# Patient Record
Sex: Female | Born: 1950
Health system: Southern US, Community
[De-identification: ages and names within clinical notes are randomized; demographics above are authoritative.]

## PROBLEM LIST (undated history)

## (undated) DIAGNOSIS — K5732 Diverticulitis of large intestine without perforation or abscess without bleeding: Secondary | ICD-10-CM

## (undated) DIAGNOSIS — F32A Depression, unspecified: Secondary | ICD-10-CM

## (undated) DIAGNOSIS — R112 Nausea with vomiting, unspecified: Secondary | ICD-10-CM

## (undated) DIAGNOSIS — T7840XA Allergy, unspecified, initial encounter: Secondary | ICD-10-CM

## (undated) DIAGNOSIS — R011 Cardiac murmur, unspecified: Secondary | ICD-10-CM

## (undated) DIAGNOSIS — K5790 Diverticulosis of intestine, part unspecified, without perforation or abscess without bleeding: Secondary | ICD-10-CM

## (undated) DIAGNOSIS — K5792 Diverticulitis of intestine, part unspecified, without perforation or abscess without bleeding: Secondary | ICD-10-CM

## (undated) DIAGNOSIS — Z9889 Other specified postprocedural states: Secondary | ICD-10-CM

## (undated) DIAGNOSIS — Z87891 Personal history of nicotine dependence: Secondary | ICD-10-CM

## (undated) DIAGNOSIS — F329 Major depressive disorder, single episode, unspecified: Secondary | ICD-10-CM

## (undated) DIAGNOSIS — C50919 Malignant neoplasm of unspecified site of unspecified female breast: Secondary | ICD-10-CM

## (undated) DIAGNOSIS — E785 Hyperlipidemia, unspecified: Secondary | ICD-10-CM

## (undated) DIAGNOSIS — K589 Irritable bowel syndrome without diarrhea: Secondary | ICD-10-CM

## (undated) HISTORY — DX: Malignant neoplasm of unspecified site of unspecified female breast: C50.919

## (undated) HISTORY — DX: Hyperlipidemia, unspecified: E78.5

## (undated) HISTORY — DX: Diverticulitis of intestine, part unspecified, without perforation or abscess without bleeding: K57.92

## (undated) HISTORY — DX: Major depressive disorder, single episode, unspecified: F32.9

## (undated) HISTORY — DX: Allergy, unspecified, initial encounter: T78.40XA

## (undated) HISTORY — DX: Depression, unspecified: F32.A

## (undated) HISTORY — DX: Cardiac murmur, unspecified: R01.1

## (undated) HISTORY — DX: Diverticulosis of intestine, part unspecified, without perforation or abscess without bleeding: K57.90

---
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
## 1982-08-02 HISTORY — PX: TUBAL LIGATION: SHX77

## 1999-03-17 ENCOUNTER — Other Ambulatory Visit: Admission: RE | Admit: 1999-03-17 | Discharge: 1999-03-17 | Payer: Self-pay | Admitting: Obstetrics & Gynecology

## 1999-04-22 ENCOUNTER — Other Ambulatory Visit: Admission: RE | Admit: 1999-04-22 | Discharge: 1999-04-22 | Payer: Self-pay | Admitting: Obstetrics & Gynecology

## 1999-06-19 ENCOUNTER — Encounter (INDEPENDENT_AMBULATORY_CARE_PROVIDER_SITE_OTHER): Payer: Self-pay

## 1999-06-19 ENCOUNTER — Ambulatory Visit (HOSPITAL_COMMUNITY): Admission: RE | Admit: 1999-06-19 | Discharge: 1999-06-19 | Payer: Self-pay | Admitting: Surgery

## 1999-06-19 ENCOUNTER — Encounter: Payer: Self-pay | Admitting: Surgery

## 1999-09-01 ENCOUNTER — Ambulatory Visit (HOSPITAL_COMMUNITY): Admission: RE | Admit: 1999-09-01 | Discharge: 1999-09-01 | Payer: Self-pay | Admitting: Surgery

## 1999-09-01 ENCOUNTER — Encounter: Payer: Self-pay | Admitting: Surgery

## 2000-03-21 ENCOUNTER — Other Ambulatory Visit: Admission: RE | Admit: 2000-03-21 | Discharge: 2000-03-21 | Payer: Self-pay | Admitting: Obstetrics & Gynecology

## 2000-09-06 ENCOUNTER — Other Ambulatory Visit: Admission: RE | Admit: 2000-09-06 | Discharge: 2000-09-06 | Payer: Self-pay | Admitting: Obstetrics & Gynecology

## 2001-05-24 ENCOUNTER — Other Ambulatory Visit: Admission: RE | Admit: 2001-05-24 | Discharge: 2001-05-24 | Payer: Self-pay | Admitting: Obstetrics & Gynecology

## 2001-08-03 LAB — HM DEXA SCAN

## 2002-03-02 LAB — HM MAMMOGRAPHY: HM Mammogram: NORMAL

## 2002-04-20 ENCOUNTER — Other Ambulatory Visit: Admission: RE | Admit: 2002-04-20 | Discharge: 2002-04-20 | Payer: Self-pay | Admitting: Obstetrics & Gynecology

## 2002-05-16 ENCOUNTER — Other Ambulatory Visit: Admission: RE | Admit: 2002-05-16 | Discharge: 2002-05-16 | Payer: Self-pay | Admitting: Obstetrics and Gynecology

## 2002-06-11 ENCOUNTER — Other Ambulatory Visit: Admission: RE | Admit: 2002-06-11 | Discharge: 2002-06-11 | Payer: Self-pay | Admitting: Surgery

## 2002-06-11 ENCOUNTER — Encounter (INDEPENDENT_AMBULATORY_CARE_PROVIDER_SITE_OTHER): Payer: Self-pay | Admitting: Specialist

## 2002-06-11 ENCOUNTER — Encounter (INDEPENDENT_AMBULATORY_CARE_PROVIDER_SITE_OTHER): Payer: Self-pay | Admitting: *Deleted

## 2002-06-11 ENCOUNTER — Encounter: Admission: RE | Admit: 2002-06-11 | Discharge: 2002-06-11 | Payer: Self-pay | Admitting: Surgery

## 2002-06-11 ENCOUNTER — Other Ambulatory Visit: Admission: RE | Admit: 2002-06-11 | Discharge: 2002-06-11 | Payer: Self-pay | Admitting: Radiology

## 2002-06-11 ENCOUNTER — Encounter: Payer: Self-pay | Admitting: Surgery

## 2002-06-20 ENCOUNTER — Encounter: Payer: Self-pay | Admitting: Surgery

## 2002-06-20 ENCOUNTER — Ambulatory Visit (HOSPITAL_COMMUNITY): Admission: RE | Admit: 2002-06-20 | Discharge: 2002-06-20 | Payer: Self-pay | Admitting: Surgery

## 2002-06-21 ENCOUNTER — Encounter: Payer: Self-pay | Admitting: Surgery

## 2002-07-03 ENCOUNTER — Encounter: Payer: Self-pay | Admitting: Surgery

## 2002-07-03 ENCOUNTER — Encounter: Admission: RE | Admit: 2002-07-03 | Discharge: 2002-07-03 | Payer: Self-pay | Admitting: Surgery

## 2002-07-17 ENCOUNTER — Other Ambulatory Visit: Admission: RE | Admit: 2002-07-17 | Discharge: 2002-07-17 | Payer: Self-pay | Admitting: Gynecology

## 2003-08-03 HISTORY — PX: BILATERAL TOTAL MASTECTOMY WITH AXILLARY LYMPH NODE DISSECTION: SHX6364

## 2003-08-03 HISTORY — PX: BREAST RECONSTRUCTION: SHX9

## 2005-05-21 ENCOUNTER — Ambulatory Visit (HOSPITAL_COMMUNITY): Admission: RE | Admit: 2005-05-21 | Discharge: 2005-05-21 | Payer: Self-pay | Admitting: *Deleted

## 2005-07-27 ENCOUNTER — Other Ambulatory Visit: Admission: RE | Admit: 2005-07-27 | Discharge: 2005-07-27 | Payer: Self-pay | Admitting: Obstetrics & Gynecology

## 2006-02-11 ENCOUNTER — Ambulatory Visit: Payer: Self-pay | Admitting: Critical Care Medicine

## 2006-03-11 ENCOUNTER — Ambulatory Visit: Payer: Self-pay | Admitting: Critical Care Medicine

## 2006-07-31 ENCOUNTER — Emergency Department (HOSPITAL_COMMUNITY): Admission: EM | Admit: 2006-07-31 | Discharge: 2006-07-31 | Payer: Self-pay | Admitting: Emergency Medicine

## 2007-08-04 LAB — HM PAP SMEAR: HM Pap smear: NORMAL

## 2009-02-21 ENCOUNTER — Encounter: Payer: Self-pay | Admitting: *Deleted

## 2009-02-21 LAB — CONVERTED CEMR LAB: Pap Smear: NORMAL

## 2009-03-11 ENCOUNTER — Encounter: Payer: Self-pay | Admitting: *Deleted

## 2009-03-11 DIAGNOSIS — F329 Major depressive disorder, single episode, unspecified: Secondary | ICD-10-CM | POA: Insufficient documentation

## 2009-03-11 DIAGNOSIS — Z8619 Personal history of other infectious and parasitic diseases: Secondary | ICD-10-CM | POA: Insufficient documentation

## 2009-03-11 DIAGNOSIS — F3289 Other specified depressive episodes: Secondary | ICD-10-CM | POA: Insufficient documentation

## 2009-03-11 DIAGNOSIS — Z853 Personal history of malignant neoplasm of breast: Secondary | ICD-10-CM | POA: Insufficient documentation

## 2009-03-11 DIAGNOSIS — Z8679 Personal history of other diseases of the circulatory system: Secondary | ICD-10-CM | POA: Insufficient documentation

## 2009-03-11 DIAGNOSIS — J309 Allergic rhinitis, unspecified: Secondary | ICD-10-CM | POA: Insufficient documentation

## 2009-03-12 ENCOUNTER — Ambulatory Visit: Payer: Self-pay | Admitting: Internal Medicine

## 2009-03-12 DIAGNOSIS — K219 Gastro-esophageal reflux disease without esophagitis: Secondary | ICD-10-CM | POA: Insufficient documentation

## 2009-05-20 ENCOUNTER — Ambulatory Visit: Payer: Self-pay | Admitting: Internal Medicine

## 2009-05-20 LAB — CONVERTED CEMR LAB
ALT: 51 units/L — ABNORMAL HIGH (ref 0–35)
BUN: 19 mg/dL (ref 6–23)
Basophils Relative: 0.7 % (ref 0.0–3.0)
Bilirubin Urine: NEGATIVE
Bilirubin, Direct: 0.1 mg/dL (ref 0.0–0.3)
CO2: 31 meq/L (ref 19–32)
Chloride: 104 meq/L (ref 96–112)
Cholesterol: 272 mg/dL — ABNORMAL HIGH (ref 0–200)
Creatinine, Ser: 0.7 mg/dL (ref 0.4–1.2)
Direct LDL: 172.5 mg/dL
Eosinophils Absolute: 0.1 10*3/uL (ref 0.0–0.7)
Eosinophils Relative: 1.5 % (ref 0.0–5.0)
Glucose, Urine, Semiquant: NEGATIVE
HCT: 44.9 % (ref 36.0–46.0)
Lymphs Abs: 1.6 10*3/uL (ref 0.7–4.0)
MCHC: 34 g/dL (ref 30.0–36.0)
MCV: 96.4 fL (ref 78.0–100.0)
Monocytes Absolute: 0.4 10*3/uL (ref 0.1–1.0)
Neutrophils Relative %: 63.3 % (ref 43.0–77.0)
Platelets: 229 10*3/uL (ref 150.0–400.0)
Potassium: 4 meq/L (ref 3.5–5.1)
Specific Gravity, Urine: >=1.03
TSH: 2.81 microintl units/mL (ref 0.35–5.50)
Total Bilirubin: 1.7 mg/dL — ABNORMAL HIGH (ref 0.3–1.2)
Total Protein: 8 g/dL (ref 6.0–8.3)
pH: 5

## 2009-05-28 ENCOUNTER — Encounter: Payer: Self-pay | Admitting: Internal Medicine

## 2009-05-28 DIAGNOSIS — E785 Hyperlipidemia, unspecified: Secondary | ICD-10-CM | POA: Insufficient documentation

## 2009-05-28 DIAGNOSIS — I079 Rheumatic tricuspid valve disease, unspecified: Secondary | ICD-10-CM | POA: Insufficient documentation

## 2009-05-29 ENCOUNTER — Ambulatory Visit: Payer: Self-pay | Admitting: Internal Medicine

## 2009-05-29 LAB — CONVERTED CEMR LAB: OCCULT 1: NEGATIVE

## 2009-07-10 ENCOUNTER — Telehealth: Payer: Self-pay | Admitting: Internal Medicine

## 2009-08-25 ENCOUNTER — Telehealth: Payer: Self-pay | Admitting: Internal Medicine

## 2009-09-12 ENCOUNTER — Ambulatory Visit: Payer: Self-pay | Admitting: Internal Medicine

## 2009-09-12 LAB — CONVERTED CEMR LAB
Albumin: 4 g/dL (ref 3.5–5.2)
Cholesterol: 246 mg/dL — ABNORMAL HIGH (ref 0–200)
Direct LDL: 147.7 mg/dL
Total Bilirubin: 1.3 mg/dL — ABNORMAL HIGH (ref 0.3–1.2)
Total CHOL/HDL Ratio: 4
Triglycerides: 180 mg/dL — ABNORMAL HIGH (ref 0.0–149.0)
VLDL: 36 mg/dL (ref 0.0–40.0)

## 2009-09-19 ENCOUNTER — Ambulatory Visit: Payer: Self-pay | Admitting: Internal Medicine

## 2009-09-19 DIAGNOSIS — R7401 Elevation of levels of liver transaminase levels: Secondary | ICD-10-CM | POA: Insufficient documentation

## 2009-09-19 DIAGNOSIS — R740 Nonspecific elevation of levels of transaminase and lactic acid dehydrogenase [LDH]: Secondary | ICD-10-CM

## 2009-09-19 LAB — CONVERTED CEMR LAB
Albumin: 3.7 g/dL (ref 3.5–5.2)
Iron: 60 ug/dL (ref 42–145)
Saturation Ratios: 20.1 % (ref 20.0–50.0)
Total Bilirubin: 0.7 mg/dL (ref 0.3–1.2)
Transferrin: 212.8 mg/dL (ref 212.0–360.0)

## 2009-10-06 LAB — CONVERTED CEMR LAB
A-1 Antitrypsin, Ser: 144 mg/dL (ref 83–200)
Ceruloplasmin: 31 mg/dL (ref 21–63)
HCV Ab: NEGATIVE
Hep B Core Total Ab: NEGATIVE
Hepatitis B Surface Ag: NEGATIVE

## 2009-11-07 ENCOUNTER — Ambulatory Visit: Payer: Self-pay | Admitting: Internal Medicine

## 2009-11-07 LAB — CONVERTED CEMR LAB
AST: 30 units/L (ref 0–37)
Bilirubin, Direct: 0 mg/dL (ref 0.0–0.3)
HDL: 68.9 mg/dL (ref >39.00)
Total Bilirubin: 0.8 mg/dL (ref 0.3–1.2)
Total CHOL/HDL Ratio: 2

## 2009-11-14 ENCOUNTER — Ambulatory Visit: Payer: Self-pay | Admitting: Internal Medicine

## 2010-01-12 ENCOUNTER — Encounter: Payer: Self-pay | Admitting: Internal Medicine

## 2010-01-16 ENCOUNTER — Encounter: Payer: Self-pay | Admitting: Endocrinology

## 2010-06-23 ENCOUNTER — Ambulatory Visit: Payer: Self-pay | Admitting: Internal Medicine

## 2010-06-23 LAB — CONVERTED CEMR LAB
ALT: 38 units/L — ABNORMAL HIGH (ref 0–35)
AST: 31 units/L (ref 0–37)
Cholesterol: 197 mg/dL (ref 0–200)
Direct LDL: 89.5 mg/dL
Total Bilirubin: 0.5 mg/dL (ref 0.3–1.2)
Total CHOL/HDL Ratio: 3
Total Protein: 7 g/dL (ref 6.0–8.3)

## 2010-08-30 LAB — CONVERTED CEMR LAB: Cholesterol, target level: 200 mg/dL

## 2010-09-03 NOTE — Letter (Signed)
Summary: [facility]  [facility]   Imported By: Maryln Gottron 01/20/2010 14:04:32  _____________________________________________________________________  External Attachment:    Type:   Image     Comment:   External Document

## 2010-09-03 NOTE — Assessment & Plan Note (Signed)
Summary: 6 MONTH F/U//ALP/PT [facility] FROM BMP/CJR/pt rescd//ccm   Vital Signs:  Patient profile:   60 year old female Height:      65 inches Weight:      156 pounds BMI:     26.05 Temp:     98.2 degrees F oral Pulse rate:   72 / minute Resp:     14 per minute BP sitting:   124 / 78  (left arm)  Vitals Entered By: Willy Eddy, LPN (June 23, 2010 2:03 PM) CC: roa Is Patient Diabetic? No   Primary Care Provider:  Stacie Glaze MD  CC:  roa.  History of Present Illness: follow up lipid management  Preventive Screening-Counseling & Management  Alcohol-Tobacco     Smoking Status: never  Problems Prior to Update: 1)  Transaminases, Serum, Elevated  (ICD-790.4) 2)  Hyperlipidemia, Mild  (ICD-272.4) 3)  Health Maintenance Exam  (ICD-V70.0) 4)  Tricuspid Regurgitation  (ICD-397.0) 5)  Esophageal Reflux  (ICD-530.81) 6)  Genital Herpes, Hx of  (ICD-V13.8) 7)  Family History of Alcoholism/addiction  (ICD-V61.41) 8)  Heart Murmur, Hx of  (ICD-V12.50) 9)  Depression  (ICD-311) 10)  Breast Cancer, Hx of  (ICD-V10.3) 11)  Allergic Rhinitis  (ICD-477.9)  Current Problems (verified): 1)  Transaminases, Serum, Elevated  (ICD-790.4) 2)  Hyperlipidemia, Mild  (ICD-272.4) 3)  Health Maintenance Exam  (ICD-V70.0) 4)  Tricuspid Regurgitation  (ICD-397.0) 5)  Esophageal Reflux  (ICD-530.81) 6)  Genital Herpes, Hx of  (ICD-V13.8) 7)  Family History of Alcoholism/addiction  (ICD-V61.41) 8)  Heart Murmur, Hx of  (ICD-V12.50) 9)  Depression  (ICD-311) 10)  Breast Cancer, Hx of  (ICD-V10.3) 11)  Allergic Rhinitis  (ICD-477.9)  Medications Prior to Update: 1)  Caltrate 600 1500 Mg Tabs (Calcium Carbonate) .... Two Times A Day 2)  Multivitamins  Tabs (Multiple Vitamin) .Marland Kitchen.. 1 Once Daily 3)  Caltrate 600 1500 Mg Tabs (Calcium Carbonate) .Marland Kitchen.. 1 Once Daily 4)  Nasonex 50 Mcg/act Susp (Mometasone Furoate) .... As Needed 5)  Zyrtec Allergy 10 Mg Caps (Cetirizine Hcl) .Marland Kitchen.. 1 Once Daily  As Needed 6)  Fish Oil 1000 Mg Caps (Omega-3 Fatty Acids) .... 2 Once Daily 7)  Lipofen 150 Mg Caps (Fenofibrate) .... One By Mouth Daily  Current Medications (verified): 1)  Caltrate 600 1500 Mg Tabs (Calcium Carbonate) .... Two Times A Day 2)  Multivitamins  Tabs (Multiple Vitamin) .Marland Kitchen.. 1 Once Daily 3)  Caltrate 600 1500 Mg Tabs (Calcium Carbonate) .Marland Kitchen.. 1 Once Daily 4)  Nasonex 50 Mcg/act Susp (Mometasone Furoate) .... As Needed 5)  Zyrtec Allergy 10 Mg Caps (Cetirizine Hcl) .Marland Kitchen.. 1 Once Daily As Needed 6)  Lipofen 150 Mg Caps (Fenofibrate) .... One By Mouth Daily  Allergies (verified): 1)  ! Nasonex (Mometasone Furoate) 2)  ! Zyrtec Allergy (Cetirizine Hcl) 3)  ! Tussionex Pennkinetic Er (Hydrocod Polst-Chlorphen Polst)  Past History:  Family History: Last updated: 03/11/2009 Family History of Alcoholism/Addiction Family History High cholesterol Family History Hypertension Family History Lung cancer Family History of Stroke F 1st degree relative <60  Social History: Last updated: 03/11/2009 Married Never Smoked Alcohol use-no Drug use-no  Risk Factors: Smoking Status: never (06/23/2010)  Past medical, surgical, family and social histories (including risk factors) reviewed, and no changes noted (except as noted below).  Past Medical History: Reviewed history from 03/11/2009 and no changes required. Allergic rhinitis Breast cancer, hx OF-Ductal carcinoma in Situ 2004 Depression Heart Murmur Gental Herpes  Past Surgical History: Reviewed history from  03/11/2009 and no changes required. Appendectomy  Mastectomy bi-lateral for breast cancer 2004 Tubal ligation 1984  Family History: Reviewed history from 03/11/2009 and no changes required. Family History of Alcoholism/Addiction Family History High cholesterol Family History Hypertension Family History Lung cancer Family History of Stroke F 1st degree relative <60  Social History: Reviewed history from  03/11/2009 and no changes required. Married Never Smoked Alcohol use-no Drug use-no  Review of Systems  The patient denies anorexia, fever, weight loss, weight gain, vision loss, decreased hearing, hoarseness, chest pain, syncope, dyspnea on exertion, peripheral edema, prolonged cough, headaches, hemoptysis, abdominal pain, melena, hematochezia, severe indigestion/heartburn, hematuria, incontinence, genital sores, muscle weakness, suspicious skin lesions, transient blindness, difficulty walking, depression, unusual weight change, abnormal bleeding, enlarged lymph nodes, angioedema, and breast masses.    Physical Exam  General:  alert and well-nourished.   Head:  normocephalic and atraumatic.   Eyes:  pupils equal and pupils round.   Ears:  R ear normal and L ear normal.   Nose:  no external deformity and no nasal discharge.   Mouth:  good dentition and pharynx pink and moist.   Neck:  supple.   Lungs:  normal respiratory effort and no intercostal retractions.   Heart:  normal rate, regular rhythm, and Grade  1/2 /6 systolic ejection murmur.   Abdomen:  soft, non-tender, and normal bowel sounds.   Msk:  No deformity or scoliosis noted of thoracic or lumbar spine.   Extremities:  No clubbing, cyanosis, edema, or deformity noted with normal full range of motion of all joints.   Neurologic:  alert & oriented X3 and gait normal.     Impression & Recommendations:  Problem # 1:  TRANSAMINASES, SERUM, ELEVATED (ICD-790.4) she has not lost weight ands this is the primary therapy fro faty liver she has lost weight ans the weight gain is a regression for goals Orders: Fingerstick (29562) Venipuncture (13086) Specimen Handling (57846) TLB-Hepatic/Liver Function Pnl (80076-HEPATIC)  Problem # 2:  HYPERLIPIDEMIA, MILD (ICD-272.4) on the meds Her updated medication list for this problem includes:    Lipofen 150 Mg Caps (Fenofibrate) ..... One by mouth daily  Orders: Venipuncture  (96295) Specimen Handling (28413) TLB-Lipid Panel (80061-LIPID)  Labs Reviewed: SGOT: 30 (11/07/2009)   SGPT: 33 (11/07/2009)  Lipid Goals: Chol Goal: 200 (05/28/2009)   HDL Goal: 40 (05/28/2009)   LDL Goal: 160 (05/28/2009)   TG Goal: 150 (05/28/2009)  Prior 10 Yr Risk Heart Disease: 2 % (11/14/2009)   HDL:68.90 (11/07/2009), 59.90 (09/12/2009)  LDL:86 (11/07/2009)  Chol:170 (11/07/2009), 246 (09/12/2009)  Trig:77.0 (11/07/2009), 180.0 (09/12/2009)  Problem # 3:  DEPRESSION (ICD-311) mood is ok except for the stress  Discussed treatment options, including trial of antidpressant medication. Will refer to behavioral health. Follow-up call in in 24-48 hours and recheck in 2 weeks, sooner as needed. Patient agrees to call if any worsening of symptoms or thoughts of doing harm arise. Verified that the patient has no suicidal ideation at this time.   Problem # 4:  ALLERGIC RHINITIS (ICD-477.9)  Her updated medication list for this problem includes:    Nasonex 50 Mcg/act Susp (Mometasone furoate) .Marland Kitchen... As needed    Zyrtec Allergy 10 Mg Caps (Cetirizine hcl) .Marland Kitchen... 1 once daily as needed  Discussed use of allergy medications and environmental measures.   Complete Medication List: 1)  Caltrate 600 1500 Mg Tabs (Calcium carbonate) .... Two times a day 2)  Multivitamins Tabs (Multiple vitamin) .Marland Kitchen.. 1 once daily 3)  Caltrate 600 1500  Mg Tabs (Calcium carbonate) .Marland Kitchen.. 1 once daily 4)  Nasonex 50 Mcg/act Susp (Mometasone furoate) .... As needed 5)  Zyrtec Allergy 10 Mg Caps (Cetirizine hcl) .Marland Kitchen.. 1 once daily as needed 6)  Lipofen 150 Mg Caps (Fenofibrate) .... One by mouth daily  Patient Instructions: 1)  Please schedule a follow-up appointment in 6 months.  CPX   Orders Added: 1)  Fingerstick [36416] 2)  Est. Patient Level III [95284] 3)  Venipuncture [13244] 4)  Specimen Handling [99000] 5)  TLB-Hepatic/Liver Function Pnl [80076-HEPATIC] 6)  TLB-Lipid Panel [80061-LIPID]

## 2010-09-03 NOTE — Progress Notes (Signed)
Summary: Tussionex  Phone Note Call from Patient   Caller: Patient Call For: Stacie Glaze MD Reason for Call: Talk to Nurse Complaint: Cough/Sore throat Summary of Call: Requesting refill on Tussionex for cough. Takes it about 4 x weekly  q hs and sometimes daily. [phone] [facility]  [phone]   Initial call taken by: Lynann Beaver CMA,  August 25, 2009 4:09 PM  Follow-up for Phone Call        per dr Troy Sine refill tussionex 1 time Follow-up by: Willy Eddy, LPN,  August 25, 2009 4:34 PM    Prescriptions: Sandria Senter ER 8-10 MG/5ML LQCR (CHLORPHENIRAMINE-HYDROCODONE) one teaspoon q 12 hours as needed cough  #4oz x 0   Entered by:   Lynann Beaver CMA   Authorized by:   Stacie Glaze MD   Signed by:   Lynann Beaver CMA on 08/25/2009   Method used:   Telephoned to ...         RxID:   [id number]

## 2010-09-03 NOTE — Letter (Signed)
Summary: [facility]   Imported By: Sherian Rein 01/22/2010 13:22:35  _____________________________________________________________________  External Attachment:    Type:   Image     Comment:   External Document

## 2010-09-03 NOTE — Assessment & Plan Note (Signed)
Summary: 2 MONTH ROV/NJR   Vital Signs:  Patient profile:   60 year old female Height:      65 inches Weight:      150 pounds BMI:     25.05 Temp:     98.2 degrees F oral Pulse rate:   72 / minute Resp:     14 per minute BP sitting:   116 / 70  (left arm)  Vitals Entered By: Willy Eddy, LPN (November 14, 2009 11:41 AM) CC: roa labs, Lipid Management   CC:  roa labs and Lipid Management.  Lipid Management History:      Positive NCEP/ATP III risk factors include female age 19 years old or older.  Negative NCEP/ATP III risk factors include HDL cholesterol greater than 60, non-tobacco-user status, no ASHD (atherosclerotic heart disease), no prior stroke/TIA, no peripheral vascular disease, and no history of aortic aneurysm.     Allergies: 1)  ! Nasonex (Mometasone Furoate) 2)  ! Zyrtec Allergy (Cetirizine Hcl) 3)  ! Tussionex Pennkinetic Er (Chlorpheniramine-Hydrocodone)   Impression & Recommendations:  Problem # 1:  HYPERLIPIDEMIA, MILD (ICD-272.4)  Her updated medication list for this problem includes:    Trilipix 135 Mg Cpdr (Choline fenofibrate) ..... One by mouth daily  Labs Reviewed: SGOT: 30 (11/07/2009)   SGPT: 33 (11/07/2009)  Lipid Goals: Chol Goal: 200 (05/28/2009)   HDL Goal: 40 (05/28/2009)   LDL Goal: 160 (05/28/2009)   TG Goal: 150 (05/28/2009)  10 Yr Risk Heart Disease: 2 % Prior 10 Yr Risk Heart Disease: Not enough information (05/28/2009)   HDL:68.90 (11/07/2009), 59.90 (09/12/2009)  LDL:86 (11/07/2009)  Chol:170 (11/07/2009), 246 (09/12/2009)  Trig:77.0 (11/07/2009), 180.0 (09/12/2009)  Problem # 2:  ESOPHAGEAL REFLUX (ICD-530.81)  Labs Reviewed: Hgb: 15.2 (05/20/2009)   Hct: 44.9 (05/20/2009)  Problem # 3:  TRANSAMINASES, SERUM, ELEVATED (ICD-790.4) reversing with weight and lipid contrl  Complete Medication List: 1)  Caltrate 600 1500 Mg Tabs (Calcium carbonate) .... Two times a day 2)  Multivitamins Tabs (Multiple vitamin) .Marland Kitchen.. 1 once  daily 3)  Caltrate 600 1500 Mg Tabs (Calcium carbonate) .Marland Kitchen.. 1 once daily 4)  Nasonex 50 Mcg/act Susp (Mometasone furoate) .... As needed 5)  Zyrtec Allergy 10 Mg Caps (Cetirizine hcl) .Marland Kitchen.. 1 once daily as needed 6)  Fish Oil 1000 Mg Caps (Omega-3 fatty acids) .... 2 once daily 7)  Trilipix 135 Mg Cpdr (Choline fenofibrate) .... One by mouth daily  Lipid Assessment/Plan:      Based on NCEP/ATP III, the patient's risk factor category is "0-1 risk factors".  The patient's lipid goals are as follows: Total cholesterol goal is 200; LDL cholesterol goal is 160; HDL cholesterol goal is 40; Triglyceride goal is 150.  Her LDL cholesterol goal has been met.  Secondary causes for hyperlipidemia have been ruled out.  She has been counseled on adjunctive measures for lowering her cholesterol and has been provided with dietary instructions.    Patient Instructions: 1)  Please schedule a follow-up appointment in 6 months. 2)  Hepatic Panel prior to visit, ICD-9:995.20 3)  Lipid Panel prior to visit, ICD-9:272.4 Prescriptions: TRILIPIX 135 MG CPDR (CHOLINE FENOFIBRATE) one by mouth daily  #30 x 11   Entered and Authorized by:   Stacie Glaze MD   Signed by:   Stacie Glaze MD on 11/14/2009   Method used:   Print then Give to Patient   RxID:   [id number]

## 2010-09-03 NOTE — Assessment & Plan Note (Signed)
Summary: 4 MONTH ROV/NJR   Vital Signs:  Patient profile:   60 year old female Height:      65 inches Weight:      152 pounds BMI:     25.39 Temp:     98.2 degrees F oral Pulse rate:   72 / minute Resp:     12 per minute BP sitting:   130 / 80  (left arm)  Vitals Entered By: Willy Eddy, LPN (September 19, 2009 11:31 AM) CC: ra labs   CC:  ra labs.  History of Present Illness: follow up lipids and elevated liver enzymes that were attributed to possible fatty liver but the elevation is worisome for other etiologies will proceed with wrk uo for potential coause of liver dz will treat the eleavtons of the lipid with fibrinates   Hyperlipidemia Follow-Up      This is a 60 year old woman who presents for Hyperlipidemia follow-up.  The patient denies muscle aches, GI upset, abdominal pain, flushing, itching, constipation, diarrhea, and fatigue.  The patient denies the following symptoms: chest pain/pressure, exercise intolerance, dypsnea, palpitations, syncope, and pedal edema.  Compliance with medications (by patient report) has been near 100%.  Dietary compliance has been excellent.  The patient reports exercising daily.    Preventive Screening-Counseling & Management  Alcohol-Tobacco     Smoking Status: never  Problems Prior to Update: 1)  Hyperlipidemia, Mild  (ICD-272.4) 2)  Health Maintenance Exam  (ICD-V70.0) 3)  Tricuspid Regurgitation  (ICD-397.0) 4)  Esophageal Reflux  (ICD-530.81) 5)  Genital Herpes, Hx of  (ICD-V13.8) 6)  Family History of Alcoholism/addiction  (ICD-V61.41) 7)  Heart Murmur, Hx of  (ICD-V12.50) 8)  Depression  (ICD-311) 9)  Breast Cancer, Hx of  (ICD-V10.3) 10)  Allergic Rhinitis  (ICD-477.9)  Current Problems (verified): 1)  Hyperlipidemia, Mild  (ICD-272.4) 2)  Health Maintenance Exam  (ICD-V70.0) 3)  Tricuspid Regurgitation  (ICD-397.0) 4)  Esophageal Reflux  (ICD-530.81) 5)  Genital Herpes, Hx of  (ICD-V13.8) 6)  Family History of  Alcoholism/addiction  (ICD-V61.41) 7)  Heart Murmur, Hx of  (ICD-V12.50) 8)  Depression  (ICD-311) 9)  Breast Cancer, Hx of  (ICD-V10.3) 10)  Allergic Rhinitis  (ICD-477.9)  Medications Prior to Update: 1)  Caltrate 600 1500 Mg Tabs (Calcium Carbonate) .... Two Times A Day 2)  Multivitamins  Tabs (Multiple Vitamin) .Marland Kitchen.. 1 Once Daily 3)  Fibercon 625 Mg Tabs (Calcium Polycarbophil) .Marland Kitchen.. 1 Once Daily 4)  Nasonex 50 Mcg/act Susp (Mometasone Furoate) .... As Needed 5)  Zyrtec Allergy 10 Mg Caps (Cetirizine Hcl) .Marland Kitchen.. 1 Once Daily As Needed 6)  Krill Oil 1000 Mg Caps (Krill Oil) .... Two By Mouth Two Times A Day 7)  Tussionex Pennkinetic Er 8-10 Mg/107ml Lqcr (Chlorpheniramine-Hydrocodone) .... One Teaspoon Q 12 Hours As Needed Cough  Current Medications (verified): 1)  Caltrate 600 1500 Mg Tabs (Calcium Carbonate) .... Two Times A Day 2)  Multivitamins  Tabs (Multiple Vitamin) .Marland Kitchen.. 1 Once Daily 3)  Fibercon 625 Mg Tabs (Calcium Polycarbophil) .Marland Kitchen.. 1 Once Daily 4)  Nasonex 50 Mcg/act Susp (Mometasone Furoate) .... As Needed 5)  Zyrtec Allergy 10 Mg Caps (Cetirizine Hcl) .Marland Kitchen.. 1 Once Daily As Needed 6)  Fish Oil 1000 Mg Caps (Omega-3 Fatty Acids) .... 2 Once Daily 7)  Trilipix 135 Mg Cpdr (Choline Fenofibrate) .... One By Mouth Daily  Allergies (verified): 1)  ! Nasonex (Mometasone Furoate) 2)  ! Zyrtec Allergy (Cetirizine Hcl) 3)  ! Tussionex Pennkinetic Er (  Chlorpheniramine-Hydrocodone)  Past History:  Family History: Last updated: 03/11/2009 Family History of Alcoholism/Addiction Family History High cholesterol Family History Hypertension Family History Lung cancer Family History of Stroke F 1st degree relative <60  Social History: Last updated: 03/11/2009 Married Never Smoked Alcohol use-no Drug use-no  Risk Factors: Smoking Status: never (09/19/2009)  Past medical, surgical, family and social histories (including risk factors) reviewed, and no changes noted (except as  noted below).  Past Medical History: Reviewed history from 03/11/2009 and no changes required. Allergic rhinitis Breast cancer, hx OF-Ductal carcinoma in Situ 2004 Depression Heart Murmur Gental Herpes  Past Surgical History: Reviewed history from 03/11/2009 and no changes required. Appendectomy  Mastectomy bi-lateral for breast cancer 2004 Tubal ligation 1984  Family History: Reviewed history from 03/11/2009 and no changes required. Family History of Alcoholism/Addiction Family History High cholesterol Family History Hypertension Family History Lung cancer Family History of Stroke F 1st degree relative <60  Social History: Reviewed history from 03/11/2009 and no changes required. Married Never Smoked Alcohol use-no Drug use-no  Review of Systems  The patient denies anorexia, fever, weight loss, weight gain, vision loss, decreased hearing, hoarseness, chest pain, syncope, dyspnea on exertion, peripheral edema, prolonged cough, headaches, hemoptysis, abdominal pain, melena, hematochezia, severe indigestion/heartburn, hematuria, incontinence, genital sores, muscle weakness, suspicious skin lesions, transient blindness, difficulty walking, depression, unusual weight change, abnormal bleeding, enlarged lymph nodes, angioedema, and breast masses.    Physical Exam  General:  alert and well-nourished.   Head:  normocephalic and atraumatic.   Eyes:  pupils equal and pupils round.   Ears:  R ear normal and L ear normal.   Nose:  no external deformity and no nasal discharge.   Mouth:  good dentition and pharynx pink and moist.   Neck:  supple and full ROM.   Lungs:  normal respiratory effort and no intercostal retractions.   Heart:  normal rate, regular rhythm, and Grade  1/2 /6 systolic ejection murmur.   Abdomen:  soft, non-tender, and normal bowel sounds.     Impression & Recommendations:  Problem # 1:  TRANSAMINASES, SERUM, ELEVATED (ICD-790.4) Assessment  Deteriorated  Orders: Venipuncture (16109) TLB-IBC Pnl (Iron/FE;Transferrin) (83550-IBC) TLB-Hepatic/Liver Function Pnl (80076-HEPATIC) T-Ceruloplasmin (60454-09811) T-Hepatitis B Core Antibody (91478-29562) T-Hepatitis B Surface Antigen (13086-57846) T-Hepatitis C Antibody (96295-28413) T-Alpha-1-Antitrypsin Tot (24401-02725)  Problem # 2:  HYPERLIPIDEMIA, MILD (ICD-272.4)  Labs Reviewed: SGOT: 77 (09/12/2009)   SGPT: 99 (09/12/2009)  Lipid Goals: Chol Goal: 200 (05/28/2009)   HDL Goal: 40 (05/28/2009)   LDL Goal: 160 (05/28/2009)   TG Goal: 150 (05/28/2009)  Prior 10 Yr Risk Heart Disease: Not enough information (05/28/2009)   HDL:59.90 (09/12/2009), 50.20 (05/20/2009)  Chol:246 (09/12/2009), 272 (05/20/2009)  Trig:180.0 (09/12/2009), 209.0 (05/20/2009)  Her updated medication list for this problem includes:    Trilipix 135 Mg Cpdr (Choline fenofibrate) ..... One by mouth daily  Complete Medication List: 1)  Caltrate 600 1500 Mg Tabs (Calcium carbonate) .... Two times a day 2)  Multivitamins Tabs (Multiple vitamin) .Marland Kitchen.. 1 once daily 3)  Fibercon 625 Mg Tabs (Calcium polycarbophil) .Marland Kitchen.. 1 once daily 4)  Nasonex 50 Mcg/act Susp (Mometasone furoate) .... As needed 5)  Zyrtec Allergy 10 Mg Caps (Cetirizine hcl) .Marland Kitchen.. 1 once daily as needed 6)  Fish Oil 1000 Mg Caps (Omega-3 fatty acids) .... 2 once daily 7)  Trilipix 135 Mg Cpdr (Choline fenofibrate) .... One by mouth daily  Patient Instructions: 1)  Please schedule a follow-up appointment in 2 months. 2)  Hepatic Panel  prior to visit, ICD-9:995.20 3)  Lipid Panel prior to visit, ICD-9:272.4

## 2010-09-16 ENCOUNTER — Other Ambulatory Visit: Payer: Self-pay | Admitting: Internal Medicine

## 2010-11-23 ENCOUNTER — Other Ambulatory Visit: Payer: Self-pay | Admitting: *Deleted

## 2010-11-23 MED ORDER — FENOFIBRATE 150 MG PO CAPS: 150.0000 mg | ORAL_CAPSULE | Freq: Every day | ORAL | Status: DC

## 2010-12-18 NOTE — Assessment & Plan Note (Signed)
HEALTHCARE                               PULMONARY OFFICE NOTE   Nicole Dawson, Nicole Dawson                      MRN:          [id number]  DATE:  02/11/2006                              DOB:      11/09/1950    CHIEF COMPLAINT:  Evaluate chronic cough.   HISTORY OF PRESENT ILLNESS:  This is a 60 year old white female who has been  labeled as having COPD in year 2005.  She has had a chronic cough that has  been episodic over the last two years now progressively worse this spring.  She works in an area at [employer] near [employer] facility  and is concerned the material from that she might be exposed to.  She notes  postnasal drainage, chronic sinus inflammation.  She has a cough that is  essentially dry, but occasionally productive of thin, clear mucus.  She does  have a rattling in the throat area.  She is not particularly short of  breath.  She is not short of breath with exertion or at rest.  She denies  any previous history of pneumonia.  She smoked for 30 years a pack a day,  but quit smoking eight years ago.  She has had bronchitis in the past, but  not frequently.  She has had no hemoptysis, no chest pain.  Denies any  irregular heartbeats.  There is no acid reflux.  She does occasionally  cough, though, when she lays flat.  She has no abdominal pain.  There is no  difficulty swallowing.  Does have some tooth and dental issues.  Has nasal  congestion and sneezing at this time.  She has been placed on Spiriva  previously and since being on the Spiriva the cough is largely improved.  She is referred for evaluation.   PAST MEDICAL HISTORY:   MEDICAL:  No history of hypertension, heart disease, heart dysrhythmias,  emphysema, diabetes, cholesterol, stroke, blood clots.  No history of  chronic headaches, renal disease, or sleep disorders.   OPERATIVE HISTORY:  1.  Bilateral mastectomy in 2004 and 2005 with saline implants for  breast      cancer.  2.  History of appendectomy 1962.  3.  Tubal ligation 1984.   MEDICATION ALLERGIES:  None.   CURRENT MEDICATIONS:  Spiriva daily, calcium daily, multivitamins daily.   SOCIAL HISTORY:  Works at [employer].  Lives with her daughter.  Is  divorced.   FAMILY HISTORY:  Positive for asthma in a sister, COPD in the mother.   REVIEW OF SYSTEMS:  Otherwise noncontributory.   PHYSICAL EXAMINATION:  GENERAL:  This is a middle-aged female in no  distress.  VITAL SIGNS:  Temperature 97, blood pressure 128/82, pulse 76, saturation  97% on room air.  CHEST:  Distant breath sounds with prolonged expiratory phase.  No wheeze or  rhonchi noted.  CARDIAC:  Regular rate and rhythm without S3.  Normal S1, S2.  ABDOMEN:  Soft, nontender.  EXTREMITIES:  No edema or clubbing.  SKIN:  Clear.  NEUROLOGIC:  Intact.  HEENT:  No jugular venous  distention.  No lymphadenopathy.  Oropharynx  clear.  NECK:  Supple.   Pulmonary functions were obtained today and revealed an FEV1 of 130% of  predicted, FVC of 137% predicted and values were compatible with normal  spirometry.   IMPRESSION:  Essentially normal spirometry in a patient with cyclical cough.  I am more concerned about reflux-induced cough and postnasal drip syndrome  than primary lung disease.   PLAN:  Obtain full set of pulmonary function studies, to get a trial of  Protonix at 40 mg daily and Brovex which is brompheniramine 5 mL b.i.d.  We  may give all due consideration to discontinuance of Spiriva.  Will see the  patient back in follow-up pending full sets of pulmonary function studies.                                   Charlcie Cradle Delford Field, MD, FCCP   PEW/MedQ  DD:  02/14/2006  DT:  02/15/2006  Job #:  [id number]   cc:   Janae Bridgeman. Eloise Harman., MD

## 2010-12-18 NOTE — Op Note (Signed)
[name], Nicole Dawson             ACCOUNT NO.:  [id number]   MEDICAL RECORD NO.:  [id number]          PATIENT TYPE:  AMB   LOCATION:  ENDO                         FACILITY:  [facility]   PHYSICIAN:  Georgiana Spinner, M.D.    DATE OF BIRTH:  1951/01/10   DATE OF PROCEDURE:  05/21/2005  DATE OF DISCHARGE:                                 OPERATIVE REPORT   PROCEDURE:  Colonoscopy.   INDICATIONS:  Colon cancer screening.   ANESTHESIA:  Demerol 100, Versed 10 mg.   PROCEDURE:  With the patient mildly sedated in the left lateral decubitus  position, the Olympus videoscopic colonoscope was inserted in the rectum and  passed under direct vision through a tortuous sigmoid colon. We reached the  cecum identified by ileocecal valve and appendiceal orifice. From this  point, the colonoscope was slowly withdrawn taking circumferential views of  the colonic mucosa stopping only in the ascending, that is the right colon,  where diverticula were seen. As we withdrew all the way to the rectum taking  circumferential views of colonic mucosa stopping then next in the rectum  which appeared normal on direct and showed hemorrhoids on retroflexed view,  the endoscope was straightened and withdrawn. The patient's vital signs and  pulse oximeter remained stable. The patient tolerated the procedure well  without apparent complications.   FINDINGS:  Internal hemorrhoids and diverticulosis of the ascending colon.   PLAN:  Have the patient follow-up with me as needed.           ______________________________  Georgiana Spinner, M.D.     GMO/MEDQ  D:  05/21/2005  T:  05/21/2005  Job:  [id number]

## 2010-12-18 NOTE — Consult Note (Signed)
NAMETENEE, WISH           ACCOUNT NO.:  [id number]   MEDICAL RECORD NO.:  [id number]          PATIENT TYPE:  EMS   LOCATION:  ED                           FACILITY:  [facility]   PHYSICIAN:  Lonia Blood, M.D.DATE OF BIRTH:  09-23-1950   DATE OF CONSULTATION:  08/01/2006  DATE OF DISCHARGE:                                 CONSULTATION   PRIMARY CARE PHYSICIAN:  Dr. Higinio Plan.   REASON FOR CONSULTATION:  Chest pain.   HISTORY OF PRESENT ILLNESS:  [name] is a very pleasant 60-  year-old female who is nondiabetic, nonhypertensive, has normal lipids,  is a nonsmoker, but has a positive family history of coronary artery  disease.  She was in her usual state of good health until this morning  when in the car driving home from church she had the acute onset of a  crampy left shoulder pain.  This pain radiated down her left arm.  There  was no paralysis or paresthesia associated with it.  There was no chest  discomfort whatsoever.  There was no shortness of breath or diaphoresis.  Symptoms lasted approximately 5 to 10 minutes maximum and then resolved  spontaneously.  Later in the evening at approximately 7:00, the patient  experienced the abrupt onset of bilateral chest-type tightness.  This  was not substernal but was below the breast bilaterally.  This was not  associated with palpitations, diaphoresis, or shortness of breath.  This  occurred while sitting still and watching TV.  Symptoms resolved after  approximately 30 minutes.  They were not altered by physical exertion by  any means.  There was no diaphoresis.  The patient has not had similar  symptoms.  Symptoms have not recurred while in the emergency room.  The  patient states that she is physically active and denies any dyspnea on  exertion or exercise intolerance of late.   REVIEW OF SYSTEMS:  Comprehensive review of symptoms is unremarkable.   PAST MEDICAL HISTORY:  1. Breast cancer, status post  bilateral mastectomies with saline      implant reconstruction in 2004.  2. Status post appendectomy in 1962.  3. Status post tubal ligation in 1984.  4. Remote 30-pack-year history of tobacco abuse with discontinuation      greater than 8 years ago.  5. Internal hemorrhoids via colonoscopy.  6. Diverticulosis at the ascending colon via colonoscopy.   OUTPATIENT MEDICATIONS:  Brovex forallergies, occasional Advil,  occasional Tylenol.   ALLERGIES:  NO KNOWN DRUG ALLERGIES.   FAMILY HISTORY:  The patient's father died at age 86 secondary to MI.  The patient's mother died with complications from COPD after having had  multiple CVAs. The patient has a sister with mitral valve prolapse.   SOCIAL HISTORY:  The patient does not presently smoke.  She does not  drink alcohol.  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   DAILY REVIEW:  Basic metabolic panel is normal.  CBC is normal.  Myoglobin is 29.  CK-MB is 1.2.  Troponin I is less than 0.05.  Chest x-  ray reveals COPD with questionable right upper lobe lung nodule.  The 12-  lead EKG is normal sinus rhythm at 84 beats per minute with no ST or T  wave changes and is a normal EKG.   PHYSICAL EXAMINATION:  VITAL SIGNS:  Temperature is 97.6 F, blood  pressure 128/70, heart rate is 84, respiratory rate is 20, and O2  saturation is 98% on room air.  GENERAL:  Well-developed and well-nourished female in no acute  respiratory distress.  HEENT:  Normocephalic and atraumatic.  Pupils are equal, round, and  reactive to light and accommodation.  Extraocular muscles are intact.  OC/OP clear.  NECK:  No JVD, no lymphadenopathy, and no thyromegaly.  LUNGS:  Clear to auscultation bilaterally without wheezes or rhonchi.  CARDIOVASCULAR:  Regular rate and rhythm without murmurs, gallops, or  rubs.  Normal S1 and S2.  CHEST WALL:  No tenderness to palpation across the chest wall, right,  left, and  central.  ABDOMEN:  Nontender, nondistended, soft, and bowel sounds present.  No  hepatosplenomegaly, no rebound, and no ascites.  EXTREMITIES:  No significant cyanosis, clubbing, or edema in bilateral  lower extremities.  NEUROLOGICAL EXAM:  5/5 strength in bilateral upper and lower  extremities.  Cranial nerves II-XII intact bilaterally.  Alert and  oriented x4.  Intact sensation throughout.  MUSCULOSKELETAL:  There is pain on palpation of the left shoulder, and  the patient has full active and passive range of motion in the shoulder  with no difficulty.  There is no erythema or evidence of joint effusion.   ASSESSMENT AND RECOMMENDATIONS:  1. Noncardiac chest pain--the patient's description of her pain is not      consistent with angina.  Although she does have a family history,      she has no other risk factors whatsoever for coronary artery      disease.  Her electrocardiogram is completely normal.  Point of      care markers are unremarkable.  At this point, I do not feel that      the patient's pretest probability for coronary artery disease is      significant enough to warrant inpatient hospitalization.      Furthermore, I do not feel that her symptoms are consistent with      unstable angina pectoris.  I have recommended that a routine      screening outpatient stress test would be appropriate given her      family history.  I have suggested that she see her primary care      physician within the next 7 to 10 days to arrange this.  She tells      me that she was due for a yearly physical anyway and plans to      proceed with planning this within the next week.  2. Possible right upper lobe nodule--the formal dictation of the      patient's chest x-ray raises the question of a possible right upper      lobe pulmonary nodule.  I have discussed this at length with the      patient.  I have discussed with her the likelihood that this is a     benign incidental finding.  I have  explained, however, that she      certainly should have this followed up on given her remote history      of smoking and her history of breast cancer.  I advised  her that      computed tomography scan of the chest would be the most appropriate      means to evaluate this.  I have explained to her that I feel it      would be reasonable to schedule this via Dr. Lendell Caprice in the      outpatient setting when she presents for her full physical.  This      way we can be assured that the results would be appropriately      followed.  I am assuring that a copy of this letter is sent to Dr.      Lendell Caprice so that he can be aware of the need for this evaluation.   Thank you very much for your consultation on this pleasant lady.  At  this point, I do not feel that there is anything that we have to offer  her by admitting her to the hospital.  I am clearing her for discharge  home and have asked that she follow up with Dr. Lendell Caprice in the next 7  to 10 days for re-evaluation, considering of outpatient cardiac stress  testing, and scheduling of the CT scan of the chest to follow up a right  upper lobe nodule.      Lonia Blood, M.D.  Electronically Signed     JTM/MEDQ  D:  08/01/2006  T:  08/01/2006  Job:  [id number]   cc:   Janae Bridgeman. Eloise Harman., M.D.  Fax: [phone]

## 2010-12-18 NOTE — Assessment & Plan Note (Signed)
[facility]                               PULMONARY OFFICE NOTE   [name], [name]                    MRN:          [id number]  DATE:03/11/2006                            DOB:          11-06-1950    Nicole Dawson was seen today in return followup.  Her cough is resolved.  Level  of dyspnea is improved.  She is now off Spiriva.  Pulmonary function has  been obtained and are completely normal.  FEV-1 of over 120% predicted  diffusion capacity.  Lung capacities are all normal.  She is on the Protonix  40 mg daily.  The cough now is gone.   EXAMINATION:  VITAL SIGNS:  Temp 98, blood pressure 98/66, pulse 80,  saturation 99% room air.  CHEST:  Showed to be completely clear without evidence of wheeze or rhonchi.  CARDIAC:  Showed a regular rate and rhythm without S3.  Normal S1, S2.  ABDOMEN:  Soft, nontender.  EXTREMITIES:  Showed no edema or clubbing.  SKIN:  Clear.   IMPRESSION:  Reflux disease without evidence of primary lung disease.   PLAN:  Discontinue further Spiriva, finish Protonix after the current bottle  expires and follow a reflux protocol diet.  She will return to this office  as needed.                                   Charlcie Cradle Delford Field, MD, FCCP   PEW/MedQ  DD:  03/11/2006  DT:  03/11/2006  Job #:  [id number]   cc:   Janae Bridgeman. Eloise Harman., MD

## 2011-01-01 ENCOUNTER — Encounter: Payer: Self-pay | Admitting: Internal Medicine

## 2011-01-01 ENCOUNTER — Ambulatory Visit (INDEPENDENT_AMBULATORY_CARE_PROVIDER_SITE_OTHER): Payer: Self-pay | Admitting: Internal Medicine

## 2011-01-01 DIAGNOSIS — R197 Diarrhea, unspecified: Secondary | ICD-10-CM

## 2011-01-01 NOTE — Progress Notes (Signed)
  Subjective:    Patient ID: Nicole Dawson, female    DOB: 06/22/1951, 60 y.o.   MRN: [id number]  HPI Pt presents to clinic for evaluation of diarrhea. Notes 3d h/o loose watery stools associated with lower abdominal cramping. Denies fever,chills, sick exposure, N/V, blood in stool or melena. Attempted otc imodium with some improvement. Increased po fluid intake and ate toast however recently ate doughnuts and a meat roast. Did have flare of cramping and diarrhea subsequently. Minimal dizziness upon standing without presyncope or syncope. No other alleviating or exacerbating factors. No other complaints.  Reviewed pmh, medications and allergies    Review of Systems see HPI     Objective:   Physical Exam  Nursing note and vitals reviewed. Constitutional: She appears well-developed and well-nourished. No distress.  HENT:  Head: Normocephalic and atraumatic.  Right Ear: External ear normal.  Nose: Nose normal.  Eyes: Conjunctivae are normal. No scleral icterus.  Abdominal: Soft. Bowel sounds are normal. She exhibits no distension and no mass. There is no tenderness. There is no rebound and no guarding.  Neurological: She is alert.  Skin: Skin is warm and dry. She is not diaphoretic.  Psychiatric: She has a normal mood and affect.          Assessment & Plan:

## 2011-01-01 NOTE — Assessment & Plan Note (Signed)
Suspect viral gastroenteritis at this point. Continue increased po fluid intake and imodium prn. Bland diet to be advanced slowly. Followup if no resolution or worsening or sx's.

## 2011-05-31 ENCOUNTER — Other Ambulatory Visit: Payer: Self-pay | Admitting: *Deleted

## 2011-05-31 ENCOUNTER — Other Ambulatory Visit: Payer: Self-pay | Admitting: Internal Medicine

## 2011-05-31 MED ORDER — HYDROCODONE-HOMATROPINE 5-1.5 MG/5ML PO SYRP: 5.0000 mL | ORAL_SOLUTION | Freq: Four times a day (QID) | ORAL | Status: AC | PRN

## 2011-05-31 MED ORDER — MOMETASONE FUROATE 50 MCG/ACT NA SUSP: 2.0000 | Freq: Every day | NASAL | Status: DC

## 2011-05-31 NOTE — Telephone Encounter (Signed)
Pt informed it was called in

## 2011-05-31 NOTE — Telephone Encounter (Signed)
Pt need nasonex and  New rx oxycodone cough med call into [facility] [phone]

## 2011-05-31 NOTE — Telephone Encounter (Signed)
Ok to call per dr Lovell Sheehan

## 2011-07-09 ENCOUNTER — Telehealth: Payer: Self-pay | Admitting: Family Medicine

## 2011-07-09 NOTE — Telephone Encounter (Signed)
Discussed and ordered lovenox

## 2011-07-09 NOTE — Telephone Encounter (Signed)
Please call husband regarding his wife. Call at: [phone]

## 2011-07-12 ENCOUNTER — Other Ambulatory Visit: Payer: Self-pay | Admitting: Internal Medicine

## 2012-04-08 ENCOUNTER — Other Ambulatory Visit: Payer: Self-pay | Admitting: Internal Medicine

## 2012-07-21 ENCOUNTER — Ambulatory Visit (INDEPENDENT_AMBULATORY_CARE_PROVIDER_SITE_OTHER): Payer: Self-pay | Admitting: Internal Medicine

## 2012-07-21 ENCOUNTER — Encounter: Payer: Self-pay | Admitting: Internal Medicine

## 2012-07-21 VITALS — BP 126/74 | Temp 98.3°F | Wt 162.0 lb

## 2012-07-21 DIAGNOSIS — L989 Disorder of the skin and subcutaneous tissue, unspecified: Secondary | ICD-10-CM

## 2012-07-21 NOTE — Assessment & Plan Note (Signed)
61 year old female with new skin lesion on right forehead near hairline. It has appearance of possible actinic keratosis versus squamous cell carcinoma. Refer to dermatology for further evaluation and treatment.

## 2012-07-21 NOTE — Progress Notes (Signed)
  Subjective:    Patient ID: Nicole Dawson, female    DOB: July 05, 1951, 61 y.o.   MRN: [id number]  HPI  61 year old white female with history of mild hyperlipidemia complains of wew skin lesion on her forehead near hairline. She first noticed on 07/04/2012. She denies any previous history of skin cancer. Skin lesion is scaly and slightly nodular. She denies pruritus.  Review of Systems Negative for bleeding    Past Medical History  Diagnosis Date  . Allergy   . Depression   . Heart murmur   . Cancer     breast  . Genital herpes     History   Social History  . Marital Status: Married    Spouse Name: N/A    Number of Children: N/A  . Years of Education: N/A   Occupational History  . Not on file.   Social History Main Topics  . Smoking status: Never Smoker   . Smokeless tobacco: Not on file  . Alcohol Use: No  . Drug Use: No  . Sexually Active:    Other Topics Concern  . Not on file   Social History Narrative  . No narrative on file    Past Surgical History  Procedure Date  . Appendectomy   . Tubal ligation   . Breast surgery     bilateral mastectomy    Family History  Problem Relation Age of Onset  . Alcohol abuse      fhx  . Hyperlipidemia      fhx  . Hypertension      fhx  . Cancer      lung/fhx  . Stroke      fhx    Allergies  Allergen Reactions  . Cetirizine Hcl   . Hydrocod Polst-Cpm Polst Er   . Mometasone Furoate     Current Outpatient Prescriptions on File Prior to Visit  Medication Sig Dispense Refill  . Calcium Carbonate (CALTRATE 600) 1500 MG TABS Take by mouth 2 (two) times daily.        . cetirizine (ZYRTEC) 10 MG tablet Take 10 mg by mouth daily.        . Multiple Vitamin (MULTIVITAMIN) tablet Take 1 tablet by mouth daily.        Marland Kitchen NASONEX 50 MCG/ACT nasal spray INSTILL 2 SPRAYS INTO EACH NOSTRIL ONCE EVERDAY  17 g  6    BP 126/74  Temp 98.3 F (36.8 C) (Oral)  Wt 162 lb (73.483 kg)    Objective:   Physical Exam   Constitutional: She appears well-developed and well-nourished.  Cardiovascular: Normal rate, regular rhythm and normal heart sounds.   Pulmonary/Chest: Effort normal and breath sounds normal. She has no wheezes.  Skin:       5-6 mm raised scaly lesion right upper hairline           Assessment & Plan:

## 2012-07-21 NOTE — Patient Instructions (Addendum)
Actinic keratosis, rule out squamous cell carcinoma Our office will contact you re: scheduling dermatology referral

## 2012-08-03 ENCOUNTER — Ambulatory Visit (INDEPENDENT_AMBULATORY_CARE_PROVIDER_SITE_OTHER): Payer: TRICARE For Life (TFL) | Admitting: Internal Medicine

## 2012-08-03 ENCOUNTER — Encounter: Payer: Self-pay | Admitting: Internal Medicine

## 2012-08-03 VITALS — BP 114/66 | Temp 98.3°F | Wt 164.0 lb

## 2012-08-03 DIAGNOSIS — M545 Low back pain, unspecified: Secondary | ICD-10-CM

## 2012-08-03 MED ORDER — METHOCARBAMOL 500 MG PO TABS: 500.0000 mg | ORAL_TABLET | Freq: Three times a day (TID) | ORAL | Status: DC | PRN

## 2012-08-03 NOTE — Assessment & Plan Note (Signed)
62 year old white female with right-sided low back pain. Symptoms consistent with possible intermittent sacroiliitis. Use over-the-counter ibuprofen 4 600 mg twice daily for 3-5 days as directed. Reviewed back exercises that she can perform. Also use robaxin 500 mg tid as needed.  Patient advised to call office if symptoms persist or worsen.

## 2012-08-03 NOTE — Progress Notes (Signed)
  Subjective:    Patient ID: Nicole Dawson, female    DOB: 1951/07/20, 62 y.o.   MRN: [id number]  HPI  62 year old white female complains of intermittent right-sided buttock pain over last 3 weeks.  She denies any history of injury or trauma. She has noticed indication hurts more when she bends forward. Pain does not radiate to her legs.  It is not bothering her much today.  She denies history of low back pain.  Review of Systems She plays with her young grandchildren often  Past Medical History  Diagnosis Date  . Allergy   . Depression   . Heart murmur   . Cancer     breast  . Genital herpes     History   Social History  . Marital Status: Married    Spouse Name: N/A    Number of Children: N/A  . Years of Education: N/A   Occupational History  . Not on file.   Social History Main Topics  . Smoking status: Never Smoker   . Smokeless tobacco: Not on file  . Alcohol Use: No  . Drug Use: No  . Sexually Active:    Other Topics Concern  . Not on file   Social History Narrative  . No narrative on file    Past Surgical History  Procedure Date  . Appendectomy   . Tubal ligation   . Breast surgery     bilateral mastectomy    Family History  Problem Relation Age of Onset  . Alcohol abuse      fhx  . Hyperlipidemia      fhx  . Hypertension      fhx  . Cancer      lung/fhx  . Stroke      fhx    Allergies  Allergen Reactions  . Cetirizine Hcl   . Hydrocod Polst-Cpm Polst Er   . Mometasone Furoate     Current Outpatient Prescriptions on File Prior to Visit  Medication Sig Dispense Refill  . Calcium Carbonate (CALTRATE 600) 1500 MG TABS Take by mouth 2 (two) times daily.        . cetirizine (ZYRTEC) 10 MG tablet Take 10 mg by mouth daily.        . Multiple Vitamin (MULTIVITAMIN) tablet Take 1 tablet by mouth daily.        Marland Kitchen NASONEX 50 MCG/ACT nasal spray INSTILL 2 SPRAYS INTO EACH NOSTRIL ONCE EVERDAY  17 g  6    BP 114/66  Temp 98.3 F (36.8 C)  (Oral)  Wt 164 lb (74.39 kg)       Objective:   Physical Exam  Constitutional: She appears well-developed and well-nourished.  Cardiovascular: Normal rate, regular rhythm and normal heart sounds.   Pulmonary/Chest: Effort normal and breath sounds normal. She has no wheezes.  Musculoskeletal:       Mild tenderness of right sacroiliac joint No tenderness of right buttock/piriformis area Normal range of motion of lumbar spine Negative straight leg test  Neurological: She displays normal reflexes. She exhibits normal muscle tone.  Psychiatric: She has a normal mood and affect. Her behavior is normal.          Assessment & Plan:

## 2012-08-03 NOTE — Patient Instructions (Addendum)
Use ibuprofen 400-600 mg twice daily as needed for 3-5 days (Take with food and plenty of water) Perform back exercises as directed Please call our office if your symptoms do not improve or gets worse.

## 2012-11-21 ENCOUNTER — Encounter: Payer: Self-pay | Admitting: Family Medicine

## 2012-11-21 ENCOUNTER — Ambulatory Visit (INDEPENDENT_AMBULATORY_CARE_PROVIDER_SITE_OTHER): Payer: TRICARE For Life (TFL) | Admitting: Family Medicine

## 2012-11-21 VITALS — BP 120/82 | Temp 98.2°F | Wt 161.0 lb

## 2012-11-21 DIAGNOSIS — M25569 Pain in unspecified knee: Secondary | ICD-10-CM

## 2012-11-21 DIAGNOSIS — M25562 Pain in left knee: Secondary | ICD-10-CM

## 2012-11-21 NOTE — Progress Notes (Signed)
Chief Complaint  Patient presents with  . left knee pain    HPI:  Acute visit for knee pain: -started a month or so ago - then went away -has intermittent pain in L knee - around upper and lat knee  -worse with squatting or hills -occ cracking sound in knees -pain is mild - not keeping her awake or interfering with activities -denies: fevers, chills, giving away, popping, weakness, numbness   ROS: See pertinent positives and negatives per HPI.  Past Medical History  Diagnosis Date  . Allergy   . Depression   . Heart murmur   . Cancer     breast  . Genital herpes     Family History  Problem Relation Age of Onset  . Alcohol abuse      fhx  . Hyperlipidemia      fhx  . Hypertension      fhx  . Cancer      lung/fhx  . Stroke      fhx    History   Social History  . Marital Status: Married    Spouse Name: N/A    Number of Children: N/A  . Years of Education: N/A   Social History Main Topics  . Smoking status: Never Smoker   . Smokeless tobacco: None  . Alcohol Use: No  . Drug Use: No  . Sexually Active:    Other Topics Concern  . None   Social History Narrative  . None    Current outpatient prescriptions:Calcium Carbonate (CALTRATE 600) 1500 MG TABS, Take by mouth 2 (two) times daily.  , Disp: , Rfl: ;  cetirizine (ZYRTEC) 10 MG tablet, Take 10 mg by mouth daily.  , Disp: , Rfl: ;  methocarbamol (ROBAXIN) 500 MG tablet, Take 1 tablet (500 mg total) by mouth 3 (three) times daily as needed., Disp: 30 tablet, Rfl: 0;  Multiple Vitamin (MULTIVITAMIN) tablet, Take 1 tablet by mouth daily.  , Disp: , Rfl:  NASONEX 50 MCG/ACT nasal spray, INSTILL 2 SPRAYS INTO EACH NOSTRIL ONCE EVERDAY, Disp: 17 g, Rfl: 6  EXAM:  Filed Vitals:   11/21/12 1526  BP: 120/82  Temp: 98.2 F (36.8 C)    Body mass index is 29.44 kg/(m^2).  GENERAL: vitals reviewed and listed above, alert, oriented, appears well hydrated and in no acute distress  HEENT: atraumatic,  conjunttiva clear, no obvious abnormalities on inspection of external nose and ears  NECK: no obvious masses on inspection  LUNGS: clear to auscultation bilaterally, no wheezes, rales or rhonchi, good air movement  CV: HRRR, no peripheral edema  MS: moves all extremities without noticeable abnormality -normal gait, normal inspection of knees - no effusion, J sign on L, neg lachman's, neg val/var stress, neg ant/post drawer, ne gmcmurray, no joint line TTP, minimal TTP along lateral petella, + single leg squat on L  PSYCH: pleasant and cooperative, no obvious depression or anxiety  ASSESSMENT AND PLAN:  Discussed the following assessment and plan:  Left knee pain  -suspect PFS - HEP provided, f/u in 1-2 months -Patient advised to return or notify a doctor immediately if symptoms worsen or persist or new concerns arise.  There are no Patient Instructions on file for this visit.   Kriste Basque R.

## 2012-12-21 ENCOUNTER — Encounter: Payer: Self-pay | Admitting: Family Medicine

## 2012-12-21 ENCOUNTER — Ambulatory Visit (INDEPENDENT_AMBULATORY_CARE_PROVIDER_SITE_OTHER): Payer: TRICARE For Life (TFL) | Admitting: Family Medicine

## 2012-12-21 VITALS — BP 120/72 | Temp 98.5°F | Wt 162.0 lb

## 2012-12-21 DIAGNOSIS — M222X2 Patellofemoral disorders, left knee: Secondary | ICD-10-CM

## 2012-12-21 DIAGNOSIS — M25569 Pain in unspecified knee: Secondary | ICD-10-CM

## 2012-12-21 DIAGNOSIS — L309 Dermatitis, unspecified: Secondary | ICD-10-CM

## 2012-12-21 DIAGNOSIS — L259 Unspecified contact dermatitis, unspecified cause: Secondary | ICD-10-CM

## 2012-12-21 NOTE — Progress Notes (Signed)
Chief Complaint  Patient presents with  . 1 month follow up    getting better per patient; more exercise     HPI:  Follow up L knee pain: -seen 4/22, treated for PFS with conservative measures and HEP -she reports has been doing much better -she has stopped doing the exercises because is feeling better  Follow up hand eczema: -resolved with cerave  ROS: See pertinent positives and negatives per HPI.  Past Medical History  Diagnosis Date  . Allergy   . Depression   . Heart murmur   . Cancer     breast  . Genital herpes     Family History  Problem Relation Age of Onset  . Alcohol abuse      fhx  . Hyperlipidemia      fhx  . Hypertension      fhx  . Cancer      lung/fhx  . Stroke      fhx    History   Social History  . Marital Status: Married    Spouse Name: N/A    Number of Children: N/A  . Years of Education: N/A   Social History Main Topics  . Smoking status: Never Smoker   . Smokeless tobacco: None  . Alcohol Use: No  . Drug Use: No  . Sexually Active:    Other Topics Concern  . None   Social History Narrative  . None    Current outpatient prescriptions:Calcium Carbonate (CALTRATE 600) 1500 MG TABS, Take by mouth 2 (two) times daily.  , Disp: , Rfl: ;  cetirizine (ZYRTEC) 10 MG tablet, Take 10 mg by mouth daily.  , Disp: , Rfl: ;  methocarbamol (ROBAXIN) 500 MG tablet, Take 1 tablet (500 mg total) by mouth 3 (three) times daily as needed., Disp: 30 tablet, Rfl: 0;  Multiple Vitamin (MULTIVITAMIN) tablet, Take 1 tablet by mouth daily.  , Disp: , Rfl:  NASONEX 50 MCG/ACT nasal spray, INSTILL 2 SPRAYS INTO EACH NOSTRIL ONCE EVERDAY, Disp: 17 g, Rfl: 6  EXAM:  Filed Vitals:   12/21/12 1509  BP: 120/72  Temp: 98.5 F (36.9 C)    Body mass index is 29.62 kg/(m^2).  GENERAL: vitals reviewed and listed above, alert, oriented, appears well hydrated and in no acute distress  HEENT: atraumatic, conjunttiva clear, no obvious abnormalities on  inspection of external nose and ears  NECK: no obvious masses on inspection  LUNGS: clear to auscultation bilaterally, no wheezes, rales or rhonchi, good air movement  CV: HRRR, no peripheral edema  MS: moves all extremities without noticeable abnormality -normal gait -normal appearance and palpation on knee -J sign + on left, improved strength L VMO, improved one leg squat  SKIN: normal on hands  PSYCH: pleasant and cooperative, no obvious depression or anxiety  ASSESSMENT AND PLAN:  Discussed the following assessment and plan:  PFS (patellofemoral syndrome), left  Hand eczema  -doing quite well, advised to continue exercises a few times per week and follow up if symptoms return -Patient advised to return or notify a doctor immediately if symptoms worsen or persist or new concerns arise.  There are no Patient Instructions on file for this visit.   Kriste Basque R.

## 2013-04-13 ENCOUNTER — Other Ambulatory Visit: Payer: Self-pay | Admitting: Internal Medicine

## 2013-05-30 ENCOUNTER — Ambulatory Visit (INDEPENDENT_AMBULATORY_CARE_PROVIDER_SITE_OTHER): Payer: TRICARE For Life (TFL) | Admitting: Internal Medicine

## 2013-05-30 ENCOUNTER — Encounter: Payer: Self-pay | Admitting: Internal Medicine

## 2013-05-30 VITALS — BP 128/78 | HR 77 | Temp 97.7°F | Ht 62.0 in | Wt 156.2 lb

## 2013-05-30 DIAGNOSIS — J209 Acute bronchitis, unspecified: Secondary | ICD-10-CM | POA: Insufficient documentation

## 2013-05-30 DIAGNOSIS — J309 Allergic rhinitis, unspecified: Secondary | ICD-10-CM

## 2013-05-30 DIAGNOSIS — R062 Wheezing: Secondary | ICD-10-CM

## 2013-05-30 MED ORDER — LEVOFLOXACIN 250 MG PO TABS: 250.0000 mg | ORAL_TABLET | Freq: Every day | ORAL | Status: DC

## 2013-05-30 MED ORDER — TRIAMCINOLONE ACETONIDE 55 MCG/ACT NA AERO: 2.0000 | INHALATION_SPRAY | Freq: Every day | NASAL | Status: DC

## 2013-05-30 MED ORDER — PREDNISONE 10 MG PO TABS: ORAL_TABLET | ORAL | Status: DC

## 2013-05-30 MED ORDER — HYDROCODONE-HOMATROPINE 5-1.5 MG/5ML PO SYRP: 5.0000 mL | ORAL_SOLUTION | Freq: Four times a day (QID) | ORAL | Status: DC | PRN

## 2013-05-30 NOTE — Progress Notes (Signed)
Subjective:    Patient ID: Nicole Dawson, female    DOB: 07/31/1951, 62 y.o.   MRN: [id number]  HPI  Here with acute onset mild to mod 2-3 days ST, HA, general weakness and malaise, with prod cough greenish sputum, but Pt denies chest pain, increased sob or doe, wheezing, orthopnea, PND, increased LE swelling, palpitations, dizziness or syncope, except for wheezing onset last night with mild sob.  Does have several wks ongoing nasal allergy symptoms with clearish congestion, itch and sneezing, without fever, pain, ST, cough, swelling or wheezing. Past Medical History  Diagnosis Date  . Allergy   . Depression   . Heart murmur   . Cancer     breast  . Genital herpes    Past Surgical History  Procedure Laterality Date  . Appendectomy    . Tubal ligation    . Breast surgery      bilateral mastectomy    reports that she has never smoked. She does not have any smokeless tobacco history on file. She reports that she does not drink alcohol or use illicit drugs. family history includes Alcohol abuse in an other family member; Cancer in an other family member; Hyperlipidemia in an other family member; Hypertension in an other family member; Stroke in an other family member. Allergies  Allergen Reactions  . Cetirizine Hcl   . Hydrocod Polst-Cpm Polst Er   . Mometasone Furoate    Current Outpatient Prescriptions on File Prior to Visit  Medication Sig Dispense Refill  . Calcium Carbonate (CALTRATE 600) 1500 MG TABS Take by mouth 2 (two) times daily.        . cetirizine (ZYRTEC) 10 MG tablet Take 10 mg by mouth daily.        . chlorpheniramine-HYDROcodone (TUSSIONEX) 10-8 MG/5ML LQCR TAKE 1 TEASPOONFUL BY MOUTH TWICE A DAY AS NEEDED FOR COUGH  120 mL  1  . methocarbamol (ROBAXIN) 500 MG tablet Take 1 tablet (500 mg total) by mouth 3 (three) times daily as needed.  30 tablet  0  . Multiple Vitamin (MULTIVITAMIN) tablet Take 1 tablet by mouth daily.        Marland Kitchen NASONEX 50 MCG/ACT nasal spray  INSTILL 2 SPRAYS INTO EACH NOSTRIL ONCE EVERDAY  17 g  6   No current facility-administered medications on file prior to visit.   Review of Systems  Constitutional: Negative for unexpected weight change, or unusual diaphoresis  HENT: Negative for tinnitus.   Eyes: Negative for photophobia and visual disturbance.  Respiratory: Negative for choking and stridor.   Gastrointestinal: Negative for vomiting and blood in stool.  Genitourinary: Negative for hematuria and decreased urine volume.  Musculoskeletal: Negative for acute joint swelling Skin: Negative for color change and wound.  Neurological: Negative for tremors and numbness other than noted  Psychiatric/Behavioral: Negative for decreased concentration or  hyperactivity.       Objective:   Physical Exam BP 128/78  Pulse 77  Temp(Src) 97.7 F (36.5 C) (Oral)  Ht 5\' 2"  (1.575 m)  Wt 156 lb 4 oz (70.875 kg)  BMI 28.57 kg/m2  SpO2 96% VS noted, mild ill Constitutional: Pt appears well-developed and well-nourished.  HENT: Head: NCAT.  Right Ear: External ear normal.  Left Ear: External ear normal.  Bilat tm's with mild erythema.  Max sinus areas non tender.  Pharynx with mild erythema, no exudate Eyes: Conjunctivae and EOM are normal. Pupils are equal, round, and reactive to light.  Neck: Normal range of motion. Neck supple.  Cardiovascular:  Normal rate and regular rhythm.   Pulmonary/Chest: Effort normal and breath sounds decreased with bilat few wheezes at bases Neurological: Pt is alert. Not confused  Skin: Skin is warm. No erythema.  Psychiatric: Pt behavior is normal. Thought content normal.     Assessment & Plan:

## 2013-05-30 NOTE — Patient Instructions (Signed)
Please take all new medication as prescribed - the antibiotic, cough med, prednisone, and Nasacort AQ Please continue all other medications as before, except ok to stop the Nasonex (too expensive) Please have the pharmacy call with any other refills you may need.  Please remember to sign up for My Chart if you have not done so, as this will be important to you in the future with finding out test results, communicating by private email, and scheduling acute appointments online when needed.

## 2013-05-30 NOTE — Assessment & Plan Note (Signed)
Mild to mod, for predpack asd,  to f/u any worsening symptoms or concerns 

## 2013-05-30 NOTE — Assessment & Plan Note (Signed)
Mild to mod, for antibx course,  to f/u any worsening symptoms or concerns 

## 2013-05-30 NOTE — Assessment & Plan Note (Signed)
Should improve with predpack, also for nasacort AQ as has few nosebleeds and not approp for flonase

## 2013-06-04 ENCOUNTER — Encounter: Payer: Self-pay | Admitting: Internal Medicine

## 2013-06-04 ENCOUNTER — Ambulatory Visit (INDEPENDENT_AMBULATORY_CARE_PROVIDER_SITE_OTHER): Payer: TRICARE For Life (TFL) | Admitting: Internal Medicine

## 2013-06-04 VITALS — BP 134/76 | HR 82 | Temp 97.7°F | Wt 156.0 lb

## 2013-06-04 DIAGNOSIS — J40 Bronchitis, not specified as acute or chronic: Secondary | ICD-10-CM

## 2013-06-04 DIAGNOSIS — J209 Acute bronchitis, unspecified: Secondary | ICD-10-CM

## 2013-06-04 MED ORDER — ALBUTEROL SULFATE HFA 108 (90 BASE) MCG/ACT IN AERS: 2.0000 | INHALATION_SPRAY | Freq: Four times a day (QID) | RESPIRATORY_TRACT | Status: DC | PRN

## 2013-06-04 MED ORDER — ALBUTEROL SULFATE (2.5 MG/3ML) 0.083% IN NEBU
2.5000 mg | INHALATION_SOLUTION | Freq: Once | RESPIRATORY_TRACT | Status: AC
  Administered 2013-06-04: 2.5 mg via RESPIRATORY_TRACT

## 2013-06-04 NOTE — Patient Instructions (Addendum)
This acts like wheezy bronchitis . Sometimes viral cause . Finish meds given   Cough med as needed. If  persistent or progressive considier getting chest x ray . Cough has between lasting 2-3 weeks in most people with these sx.  Can try inhaler as needed.' Cough could last another week.

## 2013-06-04 NOTE — Progress Notes (Signed)
Chief Complaint  Patient presents with  . Cough    Started on Friday.  Some low grade fever  Seen Dr. Fayrene Fearing on 05/30/13 for this problem.  . Fever  . Nasal Congestion    HPI: Patient comes in today for SDA for   Ongoing problem evaluation. pcp NA. Saw dr Jonny Ruiz last week for this illness  Felt tpo be wheezing and resp infection. Since over 5 days.   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  levaquin since 4 days . Fever at onset .  No hx of asthma but has wheezed previously. Has  some tussionex  Used ocass   ROS: See pertinent positives and negatives per HPI. No v d  Or ets. Past Medical History  Diagnosis Date  . Allergy   . Depression   . Heart murmur   . Cancer     breast  . Genital herpes     Family History  Problem Relation Age of Onset  . Alcohol abuse      fhx  . Hyperlipidemia      fhx  . Hypertension      fhx  . Cancer      lung/fhx  . Stroke      fhx    History   Social History  . Marital Status: Married    Spouse Name: N/A    Number of Children: N/A  . Years of Education: N/A   Social History Main Topics  . Smoking status: Never Smoker   . Smokeless tobacco: None  . Alcohol Use: No  . Drug Use: No  . Sexual Activity:    Other Topics Concern  . None   Social History Narrative  . None    Outpatient Encounter Prescriptions as of 06/04/2013  Medication Sig  . Calcium Carbonate (CALTRATE 600) 1500 MG TABS Take by mouth 2 (two) times daily.    . cetirizine (ZYRTEC) 10 MG tablet Take 10 mg by mouth daily.    Marland Kitchen levofloxacin (LEVAQUIN) 250 MG tablet Take 1 tablet (250 mg total) by mouth daily.  . Multiple Vitamin (MULTIVITAMIN) tablet Take 1 tablet by mouth daily.    Marland Kitchen NASONEX 50 MCG/ACT nasal spray INSTILL 2 SPRAYS INTO EACH NOSTRIL ONCE EVERDAY  . predniSONE (DELTASONE) 10 MG tablet 3 tabs by mouth per day for 3  days,2tabs per day for 3 days,1tab per day for 3 days  . [DISCONTINUED] chlorpheniramine-HYDROcodone (TUSSIONEX) 10-8 MG/5ML LQCR TAKE 1 TEASPOONFUL BY MOUTH TWICE A DAY AS NEEDED FOR COUGH  . albuterol (PROAIR HFA) 108 (90 BASE) MCG/ACT inhaler Inhale 2 puffs into the lungs every 6 (six) hours as needed for wheezing.  Marland Kitchen HYDROcodone-homatropine (HYCODAN) 5-1.5 MG/5ML syrup Take 5 mLs by mouth every 6 (six) hours as needed for cough.  . methocarbamol (ROBAXIN) 500 MG tablet Take 1 tablet (500 mg total) by mouth 3 (three) times daily as needed.  . triamcinolone (NASACORT AQ) 55 MCG/ACT AERO nasal inhaler Place 2 sprays into the nose daily.  Marland Kitchen albuterol (PROVENTIL) (2.5 MG/3ML) 0.083% nebulizer solution 2.5 mg     EXAM:  BP 134/76  Pulse 82  Temp(Src) 97.7 F (36.5 C) (Oral)  Wt 156 lb (70.761 kg)  SpO2 97%  Body mass index is 28.53 kg/(m^2).  GENERAL: vitals reviewed and listed above, alert,  oriented, appears well hydrated and in no acute distress non toxic ocass coughing spasm   HEENT: atraumatic, conjunctiva  clear, no obvious abnormalities on inspection of external nose and ears tms clear nares patent no face pain  OP : no lesion edema or exudate  Good airway NECK: no obvious masses on inspection palpation  No adenopathy LUNGS: no retractions diffuse musical sounds wheeze  Exp more than insp after ventolin neb noted  Improved air movement . No rales  CV: HRRR, no clubbing cyanosis or  peripheral edema nl cap refill  MS: moves all extremities without noticeable focal  Abnormality no acute rashes  PSYCH: pleasant and cooperative, no obvious depression or anxiety  ASSESSMENT AND PLAN:  Discussed the following assessment and plan:  Wheezy bronchitis  Acute bronchitis - Plan: albuterol (PROVENTIL) (2.5 MG/3ML) 0.083% nebulizer solution 2.5 mg Poss viral on amtibiotic and pred  expctant management  fuy with alarm sx consider x ray if  persistent or progressive or fever etc.   Can try  inhaler for sx int he meantime if needed.  noteor work today  -Patient advised to return or notify health care team  if symptoms worsen or persist or new concerns arise.  Patient Instructions  This acts like wheezy bronchitis . Sometimes viral cause . Finish meds given   Cough med as needed. If  persistent or progressive considier getting chest x ray . Cough has between lasting 2-3 weeks in most people with these sx.  Can try inhaler as needed.' Cough could last another week.    Neta Mends. Aralyn Nowak M.D.

## 2013-10-15 ENCOUNTER — Telehealth: Payer: Self-pay | Admitting: Internal Medicine

## 2013-10-15 NOTE — Telephone Encounter (Signed)
Error/kh 

## 2013-11-12 ENCOUNTER — Other Ambulatory Visit (INDEPENDENT_AMBULATORY_CARE_PROVIDER_SITE_OTHER): Payer: TRICARE For Life (TFL)

## 2013-11-12 DIAGNOSIS — E785 Hyperlipidemia, unspecified: Secondary | ICD-10-CM

## 2013-11-12 DIAGNOSIS — Z0000 Encounter for general adult medical examination without abnormal findings: Secondary | ICD-10-CM

## 2013-11-12 LAB — LIPID PANEL
Cholesterol: 235 mg/dL — ABNORMAL HIGH (ref 0–200)
HDL: 60.2 mg/dL (ref >39.00)
LDL CALC: 156 mg/dL — AB (ref 0–99)
Total CHOL/HDL Ratio: 4
Triglycerides: 92 mg/dL (ref 0.0–149.0)
VLDL: 18.4 mg/dL (ref 0.0–40.0)

## 2013-11-12 LAB — CBC WITH DIFFERENTIAL/PLATELET
Basophils Absolute: 0 10*3/uL (ref 0.0–0.1)
Basophils Relative: 0.5 % (ref 0.0–3.0)
Eosinophils Absolute: 0.1 10*3/uL (ref 0.0–0.7)
Eosinophils Relative: 1.1 % (ref 0.0–5.0)
HEMATOCRIT: 43.2 % (ref 36.0–46.0)
Hemoglobin: 14.6 g/dL (ref 12.0–15.0)
Lymphocytes Relative: 25.6 % (ref 12.0–46.0)
Lymphs Abs: 1.5 10*3/uL (ref 0.7–4.0)
MCHC: 33.9 g/dL (ref 30.0–36.0)
MCV: 95.4 fl (ref 78.0–100.0)
MONO ABS: 0.4 10*3/uL (ref 0.1–1.0)
Monocytes Relative: 6.2 % (ref 3.0–12.0)
NEUTROS PCT: 66.6 % (ref 43.0–77.0)
Neutro Abs: 3.8 10*3/uL (ref 1.4–7.7)
Platelets: 290 10*3/uL (ref 150.0–400.0)
RBC: 4.52 Mil/uL (ref 3.87–5.11)
RDW: 13.6 % (ref 11.5–14.6)
WBC: 5.7 10*3/uL (ref 4.5–10.5)

## 2013-11-12 LAB — POCT URINALYSIS DIPSTICK
Bilirubin, UA: NEGATIVE
GLUCOSE UA: NEGATIVE
Ketones, UA: NEGATIVE
NITRITE UA: NEGATIVE
PH UA: 5.5
PROTEIN UA: NEGATIVE
Spec Grav, UA: 1.025
Urobilinogen, UA: 0.2

## 2013-11-12 LAB — BASIC METABOLIC PANEL
BUN: 20 mg/dL (ref 6–23)
CHLORIDE: 103 meq/L (ref 96–112)
CO2: 29 mEq/L (ref 19–32)
CREATININE: 0.7 mg/dL (ref 0.4–1.2)
Calcium: 9.4 mg/dL (ref 8.4–10.5)
GFR: 87.57 mL/min (ref >60.00)
Glucose, Bld: 90 mg/dL (ref 70–99)
POTASSIUM: 4.4 meq/L (ref 3.5–5.1)
Sodium: 140 mEq/L (ref 135–145)

## 2013-11-12 LAB — HEPATIC FUNCTION PANEL
ALK PHOS: 110 U/L (ref 39–117)
ALT: 24 U/L (ref 0–35)
AST: 22 U/L (ref 0–37)
Albumin: 3.9 g/dL (ref 3.5–5.2)
BILIRUBIN DIRECT: 0.2 mg/dL (ref 0.0–0.3)
TOTAL PROTEIN: 7.3 g/dL (ref 6.0–8.3)
Total Bilirubin: 1.4 mg/dL — ABNORMAL HIGH (ref 0.3–1.2)

## 2013-11-12 LAB — TSH: TSH: 3.61 u[IU]/mL (ref 0.35–5.50)

## 2013-11-19 ENCOUNTER — Ambulatory Visit (INDEPENDENT_AMBULATORY_CARE_PROVIDER_SITE_OTHER): Payer: TRICARE For Life (TFL) | Admitting: Internal Medicine

## 2013-11-19 ENCOUNTER — Encounter: Payer: Self-pay | Admitting: Internal Medicine

## 2013-11-19 VITALS — BP 112/78 | HR 78 | Temp 98.0°F | Ht 62.0 in | Wt 152.0 lb

## 2013-11-19 DIAGNOSIS — E785 Hyperlipidemia, unspecified: Secondary | ICD-10-CM

## 2013-11-19 DIAGNOSIS — Z0000 Encounter for general adult medical examination without abnormal findings: Secondary | ICD-10-CM

## 2013-11-19 DIAGNOSIS — L0233 Carbuncle of buttock: Secondary | ICD-10-CM

## 2013-11-19 DIAGNOSIS — L0232 Furuncle of buttock: Secondary | ICD-10-CM

## 2013-11-19 MED ORDER — ATORVASTATIN CALCIUM 20 MG PO TABS: 20.0000 mg | ORAL_TABLET | ORAL | Status: DC

## 2013-11-19 MED ORDER — CEPHALEXIN 500 MG PO CAPS: 500.0000 mg | ORAL_CAPSULE | Freq: Four times a day (QID) | ORAL | Status: DC

## 2013-11-19 NOTE — Progress Notes (Signed)
Pre visit review using our clinic review tool, if applicable. No additional management support is needed unless otherwise documented below in the visit note. 

## 2013-11-19 NOTE — Progress Notes (Signed)
   Subjective:    Patient ID: Nicole Dawson, female    DOB: 15-Mar-1951, 63 y.o.   MRN: [id number]  HPI CPX ASthma stable No use of albuterol On singular and zyrtec and nasocort prn Working at [employer]  Constitutional: Negative for activity change, appetite change and fatigue.  HENT: Negative for congestion, ear pain, postnasal drip and sinus pressure.   Eyes: Negative for redness and visual disturbance.  Respiratory: Negative for cough, shortness of breath and wheezing.   Gastrointestinal: Negative for abdominal pain and abdominal distention.  Genitourinary: Negative for dysuria, frequency and menstrual problem.  Musculoskeletal: Negative for arthralgias, joint swelling, myalgias and neck pain.  Skin: Negative for rash and wound.  Neurological: Negative for dizziness, weakness and headaches.  Hematological: Negative for adenopathy. Does not bruise/bleed easily.  Psychiatric/Behavioral: Negative for sleep disturbance and decreased concentration.       Objective:   Physical Exam  Constitutional: She is oriented to person, place, and time. She appears well-developed and well-nourished. No distress.  HENT:  Head: Normocephalic and atraumatic.  Right Ear: External ear normal.  Left Ear: External ear normal.  Nose: Nose normal.  Mouth/Throat: Oropharynx is clear and moist.  Eyes: Conjunctivae and EOM are normal. Pupils are equal, round, and reactive to light.  Neck: Normal range of motion. Neck supple. No JVD present. No tracheal deviation present. No thyromegaly present.  Cardiovascular: Normal rate, regular rhythm, normal heart sounds and intact distal pulses.   No murmur heard. Pulmonary/Chest: Effort normal and breath sounds normal. She has no wheezes. She exhibits no tenderness.  Abdominal: Soft. Bowel sounds are normal.  Musculoskeletal: Normal range of motion. She exhibits no edema and no tenderness.  Lymphadenopathy:    She has no cervical  adenopathy.  Neurological: She is alert and oriented to person, place, and time. She has normal reflexes. No cranial nerve deficit.  Skin: Skin is warm and dry. She is not diaphoretic.  Boil on left buttock  Psychiatric: She has a normal mood and affect. Her behavior is normal.          Assessment & Plan:   This is a routine wellness  examination for this patient . I reviewed all health maintenance protocols including mammography, colonoscopy, bone density Needed referrals were placed. Age and diagnosis  appropriate screening labs were ordered. Her immunization history was reviewed and appropriate vaccinations were ordered. Her current medications and allergies were reviewed and needed refills of her chronic medications were ordered. The plan for yearly health maintenance was discussed all orders and referrals were made as appropriate. Add lipitor 20 mg three times a week\  Keflex tid for boil

## 2013-11-19 NOTE — Patient Instructions (Signed)
The patient is instructed to continue all medications as prescribed. Schedule followup with check out clerk upon leaving the clinic  

## 2013-12-07 ENCOUNTER — Telehealth: Payer: Self-pay | Admitting: Internal Medicine

## 2013-12-07 MED ORDER — GREEN MOUTHWASH 18.5-0.45-0.005 % MT LIQD: 10.0000 mL | Freq: Three times a day (TID) | OROMUCOSAL | Status: DC

## 2013-12-07 NOTE — Telephone Encounter (Signed)
Patient Information:  Caller Name: Misaki  Phone: [phone]  Patient: Nicole Dawson  Gender: Female  DOB: [birth year]  Age: 63 Years  PCP: Benay Pillow (Adults only)  Office Follow Up:  Does the office need to follow up with this patient?: Yes  Instructions For The Office: Please f/u  with pt concerning Rx called to  [facility], Thank you.   Symptoms  Reason For Call & Symptoms: Pt reports she has a fuzzy tongue and has been on antibiotic and completed antibiotic.  Pt states she had the same thing several years ago and was given Magic Mouthwash.  Pt asking if Rx can be called in.  Reviewed Health History In EMR: Yes  Reviewed Medications In EMR: Yes  Reviewed Allergies In EMR: Yes  Reviewed Surgeries / Procedures: Yes  Date of Onset of Symptoms: 12/05/2013  Guideline(s) Used:  No Protocol Available - Sick Adult  Disposition Per Guideline:   Discuss with PCP and Callback by Nurse Today  Reason For Disposition Reached:   Nursing judgment  Advice Given:  Call Back If:  You become worse.  Patient Will Follow Care Advice:  YES

## 2013-12-07 NOTE — Telephone Encounter (Signed)
Rx sent to pharmacy   

## 2014-02-19 ENCOUNTER — Other Ambulatory Visit: Payer: TRICARE For Life (TFL)

## 2014-02-22 ENCOUNTER — Other Ambulatory Visit

## 2014-03-01 ENCOUNTER — Other Ambulatory Visit (INDEPENDENT_AMBULATORY_CARE_PROVIDER_SITE_OTHER)

## 2014-03-01 DIAGNOSIS — E785 Hyperlipidemia, unspecified: Secondary | ICD-10-CM

## 2014-03-01 LAB — HEPATIC FUNCTION PANEL
ALK PHOS: 93 U/L (ref 39–117)
ALT: 34 U/L (ref 0–35)
AST: 26 U/L (ref 0–37)
Albumin: 3.9 g/dL (ref 3.5–5.2)
Bilirubin, Direct: 0.2 mg/dL (ref 0.0–0.3)
Total Bilirubin: 1.4 mg/dL — ABNORMAL HIGH (ref 0.2–1.2)
Total Protein: 7 g/dL (ref 6.0–8.3)

## 2014-03-01 LAB — LIPID PANEL
CHOLESTEROL: 163 mg/dL (ref 0–200)
HDL: 54.1 mg/dL (ref >39.00)
LDL Cholesterol: 81 mg/dL (ref 0–99)
NONHDL: 108.9
Total CHOL/HDL Ratio: 3
Triglycerides: 141 mg/dL (ref 0.0–149.0)
VLDL: 28.2 mg/dL (ref 0.0–40.0)

## 2014-03-08 ENCOUNTER — Encounter: Payer: Self-pay | Admitting: Family Medicine

## 2014-03-08 ENCOUNTER — Ambulatory Visit (INDEPENDENT_AMBULATORY_CARE_PROVIDER_SITE_OTHER): Admitting: Family Medicine

## 2014-03-08 VITALS — BP 110/72 | HR 80 | Temp 97.9°F | Wt 154.0 lb

## 2014-03-08 DIAGNOSIS — E785 Hyperlipidemia, unspecified: Secondary | ICD-10-CM

## 2014-03-08 NOTE — Patient Instructions (Signed)
High cholesterol  Take atorvastatin 20mg  twice a week  Take baby aspirin daily

## 2014-03-08 NOTE — Progress Notes (Signed)
Garret Reddish, MD Phone: [phone]  Subjective:  Patient presents today to follow up on hyperlipidemia labs. Plans to establish with me so reviewed history comprehensively. Chief complaint-noted.   Hyperlipidemia On statin: atorvastatin 3x a week Not taking ASA Regular exercise: daily walking ROS- no chest pain or shortness of breath. No myalgias (some mild ache after walking)  The following were reviewed and entered/updated in epic: Past Medical History  Diagnosis Date  . Allergy   . Depression   . Heart murmur   . Cancer     breast  . Genital herpes    Patient Active Problem List   Diagnosis Date Noted  . HYPERLIPIDEMIA, MILD 05/28/2009    Priority: Medium  . BREAST CANCER, HX OF 03/11/2009    Priority: Medium  . Wheezing 05/30/2013    Priority: Low  . Low back pain 08/03/2012    Priority: Low  . TRICUSPID REGURGITATION 05/28/2009    Priority: Low  . DEPRESSION 03/11/2009    Priority: Low  . ALLERGIC RHINITIS 03/11/2009    Priority: Low  . HEART MURMUR, HX OF 03/11/2009    Priority: Low  . GENITAL HERPES, HX OF 03/11/2009    Priority: Low   Past Surgical History  Procedure Laterality Date  . Appendectomy    . Tubal ligation    . Breast surgery      bilateral mastectomy    Family History  Problem Relation Age of Onset  . Alcohol abuse      fhx  . Hyperlipidemia      fhx  . Hypertension      fhx  . Cancer      lung/fhx  . Stroke Mother     in 52s.   Marland Kitchen Heart attack Father     67.   . Tuberculosis Father     1/2 of lung removed.     Medications- reviewed and updated Current Outpatient Prescriptions  Medication Sig Dispense Refill  . atorvastatin (LIPITOR) 20 MG tablet Take 1 tablet (20 mg total) by mouth 3 (three) times a week.  90 tablet  3  . Calcium Carbonate (CALTRATE 600) 1500 MG TABS Take by mouth 2 (two) times daily.        . cetirizine (ZYRTEC) 10 MG tablet Take 10 mg by mouth daily.        . Coenzyme Q10 (CO Q-10) 100 MG CAPS  Take by mouth every other day.      . montelukast (SINGULAIR) 10 MG tablet Take 10 mg by mouth at bedtime.      . Mouthwashes (GREEN MOUTHWASH) 18.5-0.45-0.005 % LIQD Take 10 mLs by mouth 3 (three) times daily. Swish and spit  720 mL  0  . Multiple Vitamin (MULTIVITAMIN) tablet Take 1 tablet by mouth daily.        Marland Kitchen NASONEX 50 MCG/ACT nasal spray INSTILL 2 SPRAYS INTO EACH NOSTRIL ONCE EVERDAY  17 g  6  . triamcinolone (NASACORT AQ) 55 MCG/ACT AERO nasal inhaler Place 2 sprays into the nose daily.  1 Inhaler  12  . valACYclovir (VALTREX) 500 MG tablet Take 500 mg by mouth daily.      Marland Kitchen albuterol (PROAIR HFA) 108 (90 BASE) MCG/ACT inhaler Inhale 2 puffs into the lungs every 6 (six) hours as needed for wheezing.  1 Inhaler  0   No current facility-administered medications for this visit.    Allergies-reviewed and updated Allergies  Allergen Reactions  . Cetirizine Hcl   . Hydrocod Polst-Cpm Polst Er   .  Mometasone Furoate     History   Social History  . Marital Status: Married    Spouse Name: N/A    Number of Children: N/A  . Years of Education: N/A   Social History Main Topics  . Smoking status: Former Research scientist (life sciences)  . Smokeless tobacco: Never Used     Comment: quit 1995  . Alcohol Use: No  . Drug Use: No  . Sexual Activity: None   ROS--See HPI   Objective: BP 110/72  Pulse 80  Temp(Src) 97.9 F (36.6 C)  Wt 154 lb (69.854 kg) Gen: NAD, resting comfortably CV: RRR no murmurs rubs or gallops Lungs: CTAB no crackles, wheeze, rhonchi Ext: no edema Skin: warm, dry, no rash  Assessment/Plan:  HYPERLIPIDEMIA, MILD 10 year risk per ascvd calculator of around 3%. Patient would like to take twice weekly Lipitor due to increased risk with father with MI age 19.  Advised aspirin 81mg .   Wants to return to discuss symptoms of potential restless leg syndrome. Considering gabapentin

## 2014-03-08 NOTE — Assessment & Plan Note (Addendum)
10 year risk per ascvd calculator of around 3%. Patient would like to take twice weekly Lipitor due to increased risk with father with MI age 61.  Advised aspirin 81mg .

## 2014-07-05 ENCOUNTER — Ambulatory Visit (INDEPENDENT_AMBULATORY_CARE_PROVIDER_SITE_OTHER): Admitting: Family Medicine

## 2014-07-05 ENCOUNTER — Other Ambulatory Visit: Payer: Self-pay | Admitting: Family Medicine

## 2014-07-05 ENCOUNTER — Encounter: Payer: Self-pay | Admitting: Family Medicine

## 2014-07-05 VITALS — BP 110/70 | HR 76 | Temp 97.7°F | Wt 153.0 lb

## 2014-07-05 DIAGNOSIS — E785 Hyperlipidemia, unspecified: Secondary | ICD-10-CM | POA: Diagnosis not present

## 2014-07-05 DIAGNOSIS — Z0000 Encounter for general adult medical examination without abnormal findings: Secondary | ICD-10-CM

## 2014-07-05 DIAGNOSIS — G2581 Restless legs syndrome: Secondary | ICD-10-CM

## 2014-07-05 DIAGNOSIS — IMO0001 Reserved for inherently not codable concepts without codable children: Secondary | ICD-10-CM

## 2014-07-05 MED ORDER — GUAIFENESIN-CODEINE 100-10 MG/5ML PO SOLN: 5.0000 mL | Freq: Three times a day (TID) | ORAL | Status: DC | PRN

## 2014-07-05 MED ORDER — GABAPENTIN 100 MG PO CAPS: 100.0000 mg | ORAL_CAPSULE | Freq: Three times a day (TID) | ORAL | Status: DC

## 2014-07-05 NOTE — Progress Notes (Signed)
Garret Reddish, MD Phone: [phone]  Subjective:  Patient presents today to establish care with me as their new primary care provider. Patient was formerly a patient of Dr. Arnoldo Morale. Chief complaint-noted.   Restless legs >5 years. Mom and cousin with similar. Legs seem to like she needs to move them due to irritation as the evening comes and particularly in bed at night. Keeps her up at times. Makes her want to move her legs and symptoms resolved when moving them. Mild worsening with time. No medications tried.  ROS- no paresthesias like numbness/tingling. No hedache/blurry vision  Hyperlipidemia-good control on 3x a week atorvastatin and reduced to 2x a week last visit with no follow up LDL  Lab Results  Component Value Date   LDLCALC 81 03/01/2014  ROS- no chest pain or shortness of breath. No myalgias  The following were reviewed and entered/updated in epic: Past Medical History  Diagnosis Date  . Allergy   . Depression   . Heart murmur   . Cancer     breast  . Genital herpes    Patient Active Problem List   Diagnosis Date Noted  . Restless leg syndrome 07/05/2014    Priority: Medium  . Hyperlipidemia 05/28/2009    Priority: Medium  . BREAST CANCER, HX OF 03/11/2009    Priority: Medium  . Wheezing 05/30/2013    Priority: Low  . Low back pain 08/03/2012    Priority: Low  . TRICUSPID REGURGITATION 05/28/2009    Priority: Low  . DEPRESSION 03/11/2009    Priority: Low  . Allergic rhinitis 03/11/2009    Priority: Low  . HEART MURMUR, HX OF 03/11/2009    Priority: Low  . History of herpes genitalis 03/11/2009    Priority: Low   Past Surgical History  Procedure Laterality Date  . Appendectomy    . Tubal ligation    . Breast surgery      bilateral mastectomy    Family History  Problem Relation Age of Onset  . Alcohol abuse      fhx  . Hyperlipidemia      fhx  . Hypertension      fhx  . Cancer      lung/fhx  . Stroke Mother     in 64s.   Marland Kitchen Heart  attack Father     82.   . Tuberculosis Father     1/2 of lung removed.     Medications- reviewed and updated Current Outpatient Prescriptions  Medication Sig Dispense Refill  . atorvastatin (LIPITOR) 20 MG tablet Take 1 tablet (20 mg total) by mouth 3 (three) times a week. 90 tablet 3  . Calcium Carbonate (CALTRATE 600) 1500 MG TABS Take by mouth 2 (two) times daily.      . cetirizine (ZYRTEC) 10 MG tablet Take 10 mg by mouth daily.      . Coenzyme Q10 (CO Q-10) 100 MG CAPS Take by mouth every other day.    . montelukast (SINGULAIR) 10 MG tablet Take 10 mg by mouth at bedtime.    . Mouthwashes (GREEN MOUTHWASH) 18.5-0.45-0.005 % LIQD Take 10 mLs by mouth 3 (three) times daily. Swish and spit 720 mL 0  . Multiple Vitamin (MULTIVITAMIN) tablet Take 1 tablet by mouth daily.      . valACYclovir (VALTREX) 500 MG tablet Take 500 mg by mouth daily.    Marland Kitchen albuterol (PROAIR HFA) 108 (90 BASE) MCG/ACT inhaler Inhale 2 puffs into the lungs every 6 (six) hours as needed for  wheezing. (Patient not taking: Reported on 07/05/2014) 1 Inhaler 0  . triamcinolone (NASACORT AQ) 55 MCG/ACT AERO nasal inhaler Place 2 sprays into the nose daily. (Patient not taking: Reported on 07/05/2014) 1 Inhaler 12   Allergies-reviewed and updated Allergies  Allergen Reactions  . Hydrocod Polst-Cpm Polst Er   . Mometasone Furoate     History   Social History  . Marital Status: Married    Spouse Name: N/A    Number of Children: N/A  . Years of Education: N/A   Social History Main Topics  . Smoking status: Former Smoker -- 1.00 packs/day for 30 years    Types: Cigarettes  . Smokeless tobacco: Never Used     Comment: quit 1995  . Alcohol Use: No  . Drug Use: No  . Sexual Activity: None   Other Topics Concern  . None   Social History Narrative   Lives in [place]. Married 2007. 2 kids (son and daughter). 2 grandkids (granddaughter and grandson). Both in [place]. Grandson on on the way (son's) in April 2016.       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       Hobbies: computer games, tv    ROS--See HPI   Objective: BP 110/70 mmHg  Pulse 76  Temp(Src) 97.7 F (36.5 C)  Wt 153 lb (69.4 kg) Gen: NAD, resting comfortably HEENT: Mucous membranes are moist. Oropharynx normal CV: RRR no murmurs rubs or gallops (still no murmur detected despite reported tricuspid regurgitation history) Lungs: CTAB no crackles, wheeze, rhonchi Abdomen: soft/nontender/nondistended/normal bowel sounds.  Ext: no edema Skin: warm, dry Neuro: grossly normal, moves all extremities, PERRLA   Assessment/Plan:  Restless leg syndrome Newly reported issues. Consistent with restless legs. Trial gabapentin 100mg  and titrate up to 300mg  depending on response. Follow up prn if does not improve or in 1 year.   Hyperlipidemia Recheck LDL as recently went down to twice weekly atorvastatin. Goal <100 LDL with family hisory of MI in father at age 53.    Health maintenance- 1x screen hep C. Patient not sure if ever had chicken pox when asking about shingles vaccine.  Orders Placed This Encounter  Procedures  . LDL cholesterol, direct    [facility]  . Hepatitis C antibody, reflex    [facility]  . Varicella zoster antibody, IgG    [facility]   Codeine cough syrup as needed when allergies get bad Meds ordered this encounter  Medications  . gabapentin (NEURONTIN) 100 MG capsule    Sig: Take 1-3 capsules (100-300 mg total) by mouth 3 (three) times daily.    Dispense:  90 capsule    Refill:  5  . guaiFENesin-codeine 100-10 MG/5ML syrup    Sig: Take 5 mLs by mouth every 8 (eight) hours as needed for cough.    Dispense:  120 mL    Refill:  1

## 2014-07-05 NOTE — Assessment & Plan Note (Signed)
Newly reported issues. Consistent with restless legs. Trial gabapentin 100mg  and titrate up to 300mg  depending on response. Follow up prn if does not improve or in 1 year.

## 2014-07-05 NOTE — Assessment & Plan Note (Signed)
Recheck LDL as recently went down to twice weekly atorvastatin. Goal <100 LDL with family hisory of MI in father at age 61.

## 2014-07-05 NOTE — Patient Instructions (Addendum)
Take 100mg  and if effective stay at that dose of gabapentin. If not quite what you want from results may increase to 200mg  after 1-2 weeks and then 300mg  after another 1-2 weeks.   Check direct ldl today and if below 100, we will stay on twice weekly statin if below this number  Codeine cough syrup as needed for nighttime cough in worsening allergy season  Health Maintenance Due  Topic Date Due  . PAP SMEAR - Dr. Stann Mainland. Have them send [place] a copy.  02/22/2012   We need to see if you've had chicken pox.   And we did your 1x test for hepatitis C today.   Restless Legs Syndrome Restless legs syndrome is a movement disorder. It may also be called a sensorimotor disorder.  CAUSES  No one knows what specifically causes restless legs syndrome, but it tends to run in families. It is also more common in people with low iron, in pregnancy, in people who need dialysis, and those with nerve damage (neuropathy).Some medications may make restless legs syndrome worse.Those medications include drugs to treat high blood pressure, some heart conditions, nausea, colds, allergies, and depression. SYMPTOMS Symptoms include uncomfortable sensations in the legs. These leg sensations are worse during periods of inactivity or rest. They are also worse while sitting or lying down. Individuals that have the disorder describe sensations in the legs that feel like:  Pulling.  Drawing.  Crawling.  Worming.  Boring.  Tingling.  Pins and needles.  Prickling.  Pain. The sensations are usually accompanied by an overwhelming urge to move the legs. Sudden muscle jerks may also occur. Movement provides temporary relief from the discomfort. In rare cases, the arms may also be affected. Symptoms may interfere with going to sleep (sleep onset insomnia). Restless legs syndrome may also be related to periodic limb movement disorder (PLMD). PLMD is another more common motor disorder. It also causes interrupted sleep. The  symptoms from PLMD usually occur most often when you are awake. TREATMENT  Treatment for restless legs syndrome is symptomatic. This means that the symptoms are treated.   Massage and cold compresses may provide temporary relief.  Walk, stretch, or take a cold or hot bath.  Get regular exercise and a good night's sleep.  Avoid caffeine, alcohol, nicotine, and medications that can make it worse.  Do activities that provide mental stimulation like discussions, needlework, and video games. These may be helpful if you are not able to walk or stretch. Some medications are effective in relieving the symptoms. However, many of these medications have side effects. Ask your caregiver about medications that may help your symptoms. Correcting iron deficiency may improve symptoms for some patients. Document Released: 07/09/2002 Document Revised: 12/03/2013 Document Reviewed: 10/15/2010 [facility] Patient Information 2015 [place], [place]. This information is not intended to replace advice given to you by your health care provider. Make sure you discuss any questions you have with your health care provider.

## 2014-07-06 LAB — HEPATITIS C ANTIBODY: HCV Ab: NEGATIVE

## 2014-07-08 LAB — LDL CHOLESTEROL, DIRECT: Direct LDL: 94.6 mg/dL

## 2014-07-08 LAB — VARICELLA ZOSTER ANTIBODY, IGG: Varicella IgG: 1842 Index — ABNORMAL HIGH (ref <135.00)

## 2014-08-06 LAB — HM PAP SMEAR: HM Pap smear: NORMAL

## 2014-08-09 DIAGNOSIS — B009 Herpesviral infection, unspecified: Secondary | ICD-10-CM | POA: Insufficient documentation

## 2015-01-29 ENCOUNTER — Other Ambulatory Visit: Payer: Self-pay | Admitting: Family Medicine

## 2015-01-30 NOTE — Telephone Encounter (Signed)
Yes but if shes not feeling better by next week or if symptoms worsen (assume this is a cold) then have her come see [place] or go to urgent care over weekend

## 2015-01-30 NOTE — Telephone Encounter (Signed)
Ok to refill 

## 2015-02-18 ENCOUNTER — Encounter: Payer: Self-pay | Admitting: Family Medicine

## 2015-03-04 ENCOUNTER — Other Ambulatory Visit: Payer: Self-pay | Admitting: *Deleted

## 2015-03-04 DIAGNOSIS — E785 Hyperlipidemia, unspecified: Secondary | ICD-10-CM

## 2015-03-04 MED ORDER — ATORVASTATIN CALCIUM 20 MG PO TABS: 20.0000 mg | ORAL_TABLET | ORAL | Status: DC

## 2015-05-31 ENCOUNTER — Other Ambulatory Visit: Payer: Self-pay | Admitting: Family Medicine

## 2015-07-07 ENCOUNTER — Ambulatory Visit (INDEPENDENT_AMBULATORY_CARE_PROVIDER_SITE_OTHER): Admitting: Family Medicine

## 2015-07-07 ENCOUNTER — Encounter: Payer: Self-pay | Admitting: Family Medicine

## 2015-07-07 VITALS — BP 100/60 | Temp 98.1°F | Wt 160.0 lb

## 2015-07-07 DIAGNOSIS — R1032 Left lower quadrant pain: Secondary | ICD-10-CM | POA: Diagnosis not present

## 2015-07-07 DIAGNOSIS — R1011 Right upper quadrant pain: Secondary | ICD-10-CM

## 2015-07-07 LAB — CBC WITH DIFFERENTIAL/PLATELET
BASOS ABS: 0 10*3/uL (ref 0.0–0.1)
Basophils Relative: 0.1 % (ref 0.0–3.0)
EOS ABS: 0 10*3/uL (ref 0.0–0.7)
Eosinophils Relative: 0.1 % (ref 0.0–5.0)
HEMATOCRIT: 41.8 % (ref 36.0–46.0)
Hemoglobin: 14.2 g/dL (ref 12.0–15.0)
LYMPHS PCT: 9.2 % — AB (ref 12.0–46.0)
Lymphs Abs: 1.5 10*3/uL (ref 0.7–4.0)
MCHC: 33.9 g/dL (ref 30.0–36.0)
MCV: 95 fl (ref 78.0–100.0)
MONOS PCT: 4.3 % (ref 3.0–12.0)
Monocytes Absolute: 0.7 10*3/uL (ref 0.1–1.0)
Neutro Abs: 14.5 10*3/uL — ABNORMAL HIGH (ref 1.4–7.7)
Neutrophils Relative %: 86.3 % — ABNORMAL HIGH (ref 43.0–77.0)
Platelets: 242 10*3/uL (ref 150.0–400.0)
RBC: 4.4 Mil/uL (ref 3.87–5.11)
RDW: 13.4 % (ref 11.5–15.5)
WBC: 16.8 10*3/uL — AB (ref 4.0–10.5)

## 2015-07-07 LAB — COMPREHENSIVE METABOLIC PANEL
ALBUMIN: 4 g/dL (ref 3.5–5.2)
ALT: 21 U/L (ref 0–35)
AST: 15 U/L (ref 0–37)
Alkaline Phosphatase: 88 U/L (ref 39–117)
BILIRUBIN TOTAL: 2.6 mg/dL — AB (ref 0.2–1.2)
BUN: 14 mg/dL (ref 6–23)
CALCIUM: 9.6 mg/dL (ref 8.4–10.5)
CO2: 29 mEq/L (ref 19–32)
Chloride: 102 mEq/L (ref 96–112)
Creatinine, Ser: 0.93 mg/dL (ref 0.40–1.20)
GFR: 64.82 mL/min (ref >60.00)
Glucose, Bld: 107 mg/dL — ABNORMAL HIGH (ref 70–99)
Potassium: 4.3 mEq/L (ref 3.5–5.1)
Sodium: 141 mEq/L (ref 135–145)
Total Protein: 6.8 g/dL (ref 6.0–8.3)

## 2015-07-07 LAB — POCT URINALYSIS DIPSTICK
GLUCOSE UA: NEGATIVE
KETONES UA: NEGATIVE
Nitrite, UA: NEGATIVE
SPEC GRAV UA: 1.02
Urobilinogen, UA: 0.2
pH, UA: 5.5

## 2015-07-07 LAB — LIPASE: Lipase: 11 U/L (ref 11.0–59.0)

## 2015-07-07 NOTE — Progress Notes (Addendum)
Garret Reddish, MD  Subjective:  Nicole Dawson is a 64 y.o. year old very pleasant female patient who presents for/with See problem oriented charting ROS- see below ROS  Past Medical History-  Patient Active Problem List   Diagnosis Date Noted  . Restless leg syndrome 07/05/2014    Priority: Medium  . Hyperlipidemia 05/28/2009    Priority: Medium  . BREAST CANCER, HX OF 03/11/2009    Priority: Medium  . Wheezing 05/30/2013    Priority: Low  . Low back pain 08/03/2012    Priority: Low  . TRICUSPID REGURGITATION 05/28/2009    Priority: Low  . DEPRESSION 03/11/2009    Priority: Low  . Allergic rhinitis 03/11/2009    Priority: Low  . HEART MURMUR, HX OF 03/11/2009    Priority: Low  . History of herpes genitalis 03/11/2009    Priority: Low  . Herpes simplex 08/09/2014    Medications- reviewed and updated Current Outpatient Prescriptions  Medication Sig Dispense Refill  . atorvastatin (LIPITOR) 20 MG tablet Take 1 tablet (20 mg total) by mouth 3 (three) times a week. 90 tablet 0  . Calcium Carbonate (CALTRATE 600) 1500 MG TABS Take by mouth 2 (two) times daily.      . cetirizine (ZYRTEC) 10 MG tablet Take 10 mg by mouth daily.      . Coenzyme Q10 (CO Q-10) 100 MG CAPS Take by mouth every other day.    . gabapentin (NEURONTIN) 100 MG capsule TAKE ONE TO THREE CAPSULES BY MOUTH THREE TIMES DAILY 90 capsule 3  . montelukast (SINGULAIR) 10 MG tablet Take 10 mg by mouth at bedtime.    . Mouthwashes (GREEN MOUTHWASH) 18.5-0.45-0.005 % LIQD Take 10 mLs by mouth 3 (three) times daily. Swish and spit 720 mL 0  . Multiple Vitamin (MULTIVITAMIN) tablet Take 1 tablet by mouth daily.      . valACYclovir (VALTREX) 500 MG tablet Take 500 mg by mouth daily.    Marland Kitchen albuterol (PROAIR HFA) 108 (90 BASE) MCG/ACT inhaler Inhale 2 puffs into the lungs every 6 (six) hours as needed for wheezing. (Patient not taking: Reported on 07/05/2014) 1 Inhaler 0  . CHERATUSSIN AC 100-10 MG/5ML syrup TAKE ONE  TEASPOONFUL BY MOUTH EVERY 8 HOURS AS NEEDED FOR COUGH (Patient not taking: Reported on 07/07/2015) 180 mL 0  . NASONEX 50 MCG/ACT nasal spray INSTILL 2 SPRAYS INTO EACH NOSTRIL ONCE EVERDAY (Patient not taking: Reported on 07/05/2014) 17 g 6  . triamcinolone (NASACORT AQ) 55 MCG/ACT AERO nasal inhaler Place 2 sprays into the nose daily. (Patient not taking: Reported on 07/05/2014) 1 Inhaler 12   No current facility-administered medications for this visit.    Objective: BP 100/60 mmHg  Temp(Src) 98.1 F (36.7 C)  Wt 160 lb (72.576 kg) Gen: NAD, resting comfortably CV: RRR no murmurs rubs or gallops Lungs: CTAB no crackles, wheeze, rhonchi Abdomen: soft/moderate tenderness in RUQ and LLQ/nondistended/active bowel sounds. No rebound or guarding.  No CVA tenderness Ext: no edema Skin: warm, dry Neuro: grossly normal, moves all extremities  Assessment/Plan:  Diarrhea/lower abdominal pain/fever in patient with history IBS S:Diarrhea started on Wednesday. Took immodium and took 5 over course of the day thursday. Next day had 4 bowel movements . Went to work on Saturday then took 2 immodiums then no more bowel movements. Sunday got up and had loose BM and took 2 immodium and then started cramping. Has had moderate to severe lower abdominal cramping since that time. Comes and goes- worse with movement.  Thought she had a temperature elevation last night. Temperature of 100.4 this morning. No BM since yesterday morning. History of irritable bowel. Irritation as far as cramping is better with urinating.  ROS- No dysuria, polyuria, chills, nausea, vomiting.  A/P: Unclear cause. Could have been gastroenteritis- odd to have fever so late in illness. Constipation can cause low grade temperature and possible. With abdominal pain though on exam both in LLQ and RUQ- will get CMET, CBC diff, lipase. If leukocytosis more than mild would consider CT scan also if fever persists or elevates. No urinary symptoms but  will get UA given area of pain but doubt pyelonephritis with pain pattern. Diverticulitis? Patient does not recall diverticula on colonoscopy. IBS? But should not cause fever.   Return precautions advised.   Orders Placed This Encounter  Procedures  . CBC with Differential/Platelet  . Comprehensive metabolic panel    [place]  . Lipase  . POCT urinalysis dipstick    In house     Addendum Results for orders placed or performed in visit on 07/07/15 (from the past 24 hour(s))  CBC with Differential/Platelet     Status: Abnormal   Collection Time: 07/07/15 12:11 PM  Result Value Ref Range   WBC 16.8 (H) 4.0 - 10.5 K/uL   RBC 4.40 3.87 - 5.11 Mil/uL   Hemoglobin 14.2 12.0 - 15.0 g/dL   HCT 41.8 36.0 - 46.0 %   MCV 95.0 78.0 - 100.0 fl   MCHC 33.9 30.0 - 36.0 g/dL   RDW 13.4 11.5 - 15.5 %   Platelets 242.0 150.0 - 400.0 K/uL   Neutrophils Relative % (H) 43.0 - 77.0 %    86.3 Manual smear review agrees with instrument differential.   Lymphocytes Relative 9.2 (L) 12.0 - 46.0 %   Monocytes Relative 4.3 3.0 - 12.0 %   Eosinophils Relative 0.1 0.0 - 5.0 %   Basophils Relative 0.1 0.0 - 3.0 %   Neutro Abs 14.5 (H) 1.4 - 7.7 K/uL   Lymphs Abs 1.5 0.7 - 4.0 K/uL   Monocytes Absolute 0.7 0.1 - 1.0 K/uL   Eosinophils Absolute 0.0 0.0 - 0.7 K/uL   Basophils Absolute 0.0 0.0 - 0.1 K/uL  Comprehensive metabolic panel     Status: Abnormal   Collection Time: 07/07/15 12:11 PM  Result Value Ref Range   Sodium 141 135 - 145 mEq/L   Potassium 4.3 3.5 - 5.1 mEq/L   Chloride 102 96 - 112 mEq/L   CO2 29 19 - 32 mEq/L   Glucose, Bld 107 (H) 70 - 99 mg/dL   BUN 14 6 - 23 mg/dL   Creatinine, Ser 0.93 0.40 - 1.20 mg/dL   Total Bilirubin 2.6 (H) 0.2 - 1.2 mg/dL   Alkaline Phosphatase 88 39 - 117 U/L   AST 15 0 - 37 U/L   ALT 21 0 - 35 U/L   Total Protein 6.8 6.0 - 8.3 g/dL   Albumin 4.0 3.5 - 5.2 g/dL   Calcium 9.6 8.4 - 10.5 mg/dL   GFR 64.82 >60.00 mL/min  Lipase     Status: None    Collection Time: 07/07/15 12:11 PM  Result Value Ref Range   Lipase 11.0 11.0 - 59.0 U/L  POCT urinalysis dipstick     Status: Abnormal   Collection Time: 07/07/15 12:45 PM  Result Value Ref Range   Color, UA yellow    Clarity, UA clear    Glucose, UA n    Bilirubin, UA 1+  Ketones, UA n    Spec Grav, UA 1.020    Blood, UA 2+    pH, UA 5.5    Protein, UA 1+    Urobilinogen, UA 0.2    Nitrite, UA n    Leukocytes, UA small (1+) (A) Negative   Add urine culture With WBC 16.8 K get CT scan tomorrow abd/pelvis

## 2015-07-07 NOTE — Patient Instructions (Signed)
Unclear cause of issues ? IBS ? Constipation after imodium  Would hold off on any imodium at this point  Check labs- if any concern for infection on labs likely to get CT scan.   If still having fevers by Wednesday or if they are increasing- please update me. May need to see you back to reevaluate or just get scan

## 2015-07-07 NOTE — Addendum Note (Signed)
Addended by: Marin Olp on: 07/07/2015 06:35 PM   Modules accepted: Orders

## 2015-07-07 NOTE — Addendum Note (Signed)
Addended by: Elmer Picker on: 07/07/2015 01:38 PM   Modules accepted: Orders

## 2015-07-08 ENCOUNTER — Encounter (HOSPITAL_COMMUNITY): Payer: Self-pay | Admitting: General Surgery

## 2015-07-08 ENCOUNTER — Other Ambulatory Visit: Payer: Self-pay | Admitting: General Surgery

## 2015-07-08 ENCOUNTER — Telehealth: Payer: Self-pay | Admitting: Family Medicine

## 2015-07-08 ENCOUNTER — Ambulatory Visit (HOSPITAL_COMMUNITY)
Admission: RE | Admit: 2015-07-08 | Discharge: 2015-07-08 | Disposition: A | Discharge status: Home / Self Care | Source: Ambulatory Visit | Attending: Family Medicine | Admitting: Family Medicine

## 2015-07-08 ENCOUNTER — Encounter: Payer: Self-pay | Admitting: Family Medicine

## 2015-07-08 ENCOUNTER — Inpatient Hospital Stay (HOSPITAL_COMMUNITY)
Admission: AD | Admit: 2015-07-08 | Discharge: 2015-07-13 | DRG: 392 | Disposition: A | Discharge status: Home / Self Care | Source: Ambulatory Visit | Attending: Family Medicine | Admitting: Family Medicine

## 2015-07-08 ENCOUNTER — Ambulatory Visit (INDEPENDENT_AMBULATORY_CARE_PROVIDER_SITE_OTHER): Admitting: Family Medicine

## 2015-07-08 ENCOUNTER — Encounter (HOSPITAL_COMMUNITY): Payer: Self-pay

## 2015-07-08 VITALS — BP 122/72 | Temp 98.9°F | Wt 160.0 lb

## 2015-07-08 DIAGNOSIS — E785 Hyperlipidemia, unspecified: Secondary | ICD-10-CM | POA: Diagnosis not present

## 2015-07-08 DIAGNOSIS — F319 Bipolar disorder, unspecified: Secondary | ICD-10-CM | POA: Diagnosis present

## 2015-07-08 DIAGNOSIS — Z9013 Acquired absence of bilateral breasts and nipples: Secondary | ICD-10-CM | POA: Diagnosis not present

## 2015-07-08 DIAGNOSIS — B009 Herpesviral infection, unspecified: Secondary | ICD-10-CM | POA: Diagnosis present

## 2015-07-08 DIAGNOSIS — K589 Irritable bowel syndrome without diarrhea: Secondary | ICD-10-CM | POA: Diagnosis present

## 2015-07-08 DIAGNOSIS — A6000 Herpesviral infection of urogenital system, unspecified: Secondary | ICD-10-CM | POA: Diagnosis present

## 2015-07-08 DIAGNOSIS — Z79899 Other long term (current) drug therapy: Secondary | ICD-10-CM

## 2015-07-08 DIAGNOSIS — Z87891 Personal history of nicotine dependence: Secondary | ICD-10-CM | POA: Diagnosis not present

## 2015-07-08 DIAGNOSIS — R011 Cardiac murmur, unspecified: Secondary | ICD-10-CM | POA: Diagnosis present

## 2015-07-08 DIAGNOSIS — K5732 Diverticulitis of large intestine without perforation or abscess without bleeding: Principal | ICD-10-CM | POA: Diagnosis present

## 2015-07-08 DIAGNOSIS — Z888 Allergy status to other drugs, medicaments and biological substances status: Secondary | ICD-10-CM | POA: Diagnosis not present

## 2015-07-08 DIAGNOSIS — G2581 Restless legs syndrome: Secondary | ICD-10-CM | POA: Diagnosis present

## 2015-07-08 DIAGNOSIS — J3489 Other specified disorders of nose and nasal sinuses: Secondary | ICD-10-CM | POA: Diagnosis present

## 2015-07-08 DIAGNOSIS — G629 Polyneuropathy, unspecified: Secondary | ICD-10-CM | POA: Diagnosis present

## 2015-07-08 DIAGNOSIS — Z853 Personal history of malignant neoplasm of breast: Secondary | ICD-10-CM | POA: Diagnosis not present

## 2015-07-08 DIAGNOSIS — R109 Unspecified abdominal pain: Secondary | ICD-10-CM | POA: Diagnosis present

## 2015-07-08 DIAGNOSIS — Z7982 Long term (current) use of aspirin: Secondary | ICD-10-CM

## 2015-07-08 DIAGNOSIS — I10 Essential (primary) hypertension: Secondary | ICD-10-CM | POA: Diagnosis present

## 2015-07-08 DIAGNOSIS — Z9049 Acquired absence of other specified parts of digestive tract: Secondary | ICD-10-CM | POA: Diagnosis not present

## 2015-07-08 DIAGNOSIS — Z823 Family history of stroke: Secondary | ICD-10-CM | POA: Diagnosis not present

## 2015-07-08 DIAGNOSIS — K219 Gastro-esophageal reflux disease without esophagitis: Secondary | ICD-10-CM | POA: Diagnosis present

## 2015-07-08 DIAGNOSIS — K5780 Diverticulitis of intestine, part unspecified, with perforation and abscess without bleeding: Secondary | ICD-10-CM | POA: Diagnosis not present

## 2015-07-08 DIAGNOSIS — R1032 Left lower quadrant pain: Secondary | ICD-10-CM

## 2015-07-08 DIAGNOSIS — R8271 Bacteriuria: Secondary | ICD-10-CM | POA: Diagnosis present

## 2015-07-08 DIAGNOSIS — R159 Full incontinence of feces: Secondary | ICD-10-CM | POA: Diagnosis present

## 2015-07-08 DIAGNOSIS — B029 Zoster without complications: Secondary | ICD-10-CM | POA: Diagnosis present

## 2015-07-08 DIAGNOSIS — Z8249 Family history of ischemic heart disease and other diseases of the circulatory system: Secondary | ICD-10-CM | POA: Diagnosis not present

## 2015-07-08 DIAGNOSIS — R1011 Right upper quadrant pain: Secondary | ICD-10-CM

## 2015-07-08 HISTORY — DX: Irritable bowel syndrome, unspecified: K58.9

## 2015-07-08 HISTORY — DX: Diverticulitis of large intestine without perforation or abscess without bleeding: K57.32

## 2015-07-08 HISTORY — DX: Other specified postprocedural states: R11.2

## 2015-07-08 HISTORY — DX: Personal history of nicotine dependence: Z87.891

## 2015-07-08 HISTORY — DX: Irritable bowel syndrome without diarrhea: K58.9

## 2015-07-08 HISTORY — DX: Other specified postprocedural states: Z98.890

## 2015-07-08 LAB — CBC
HCT: 36.9 % (ref 36.0–46.0)
Hemoglobin: 13.3 g/dL (ref 12.0–15.0)
MCH: 33.3 pg (ref 26.0–34.0)
MCHC: 36 g/dL (ref 30.0–36.0)
MCV: 92.3 fL (ref 78.0–100.0)
PLATELETS: DECREASED 10*3/uL (ref 150–400)
RBC: 4 MIL/uL (ref 3.87–5.11)
RDW: 13.3 % (ref 11.5–15.5)
WBC: 8.8 10*3/uL (ref 4.0–10.5)

## 2015-07-08 LAB — COMPREHENSIVE METABOLIC PANEL
ALBUMIN: 3.7 g/dL (ref 3.5–5.2)
ALT: 63 U/L — ABNORMAL HIGH (ref 0–35)
AST: 49 U/L — AB (ref 0–37)
Alkaline Phosphatase: 104 U/L (ref 39–117)
BUN: 11 mg/dL (ref 6–23)
CHLORIDE: 103 meq/L (ref 96–112)
CO2: 31 meq/L (ref 19–32)
CREATININE: 0.91 mg/dL (ref 0.40–1.20)
Calcium: 9.4 mg/dL (ref 8.4–10.5)
GFR: 66.47 mL/min (ref >60.00)
GLUCOSE: 151 mg/dL — AB (ref 70–99)
Potassium: 4.3 mEq/L (ref 3.5–5.1)
SODIUM: 139 meq/L (ref 135–145)
Total Bilirubin: 1.6 mg/dL — ABNORMAL HIGH (ref 0.2–1.2)
Total Protein: 7.3 g/dL (ref 6.0–8.3)

## 2015-07-08 LAB — CBC WITH DIFFERENTIAL/PLATELET
Basophils Absolute: 0 10*3/uL (ref 0.0–0.1)
Basophils Relative: 0.2 % (ref 0.0–3.0)
Eosinophils Absolute: 0 10*3/uL (ref 0.0–0.7)
Eosinophils Relative: 0.2 % (ref 0.0–5.0)
HCT: 39.8 % (ref 36.0–46.0)
Hemoglobin: 13.4 g/dL (ref 12.0–15.0)
Lymphocytes Relative: 9.1 % — ABNORMAL LOW (ref 12.0–46.0)
Lymphs Abs: 1 10*3/uL (ref 0.7–4.0)
MCHC: 33.7 g/dL (ref 30.0–36.0)
MCV: 94.8 fl (ref 78.0–100.0)
Monocytes Absolute: 0.5 10*3/uL (ref 0.1–1.0)
Monocytes Relative: 4.2 % (ref 3.0–12.0)
Neutro Abs: 9.5 10*3/uL — ABNORMAL HIGH (ref 1.4–7.7)
Neutrophils Relative %: 86.3 % — ABNORMAL HIGH (ref 43.0–77.0)
Platelets: 231 10*3/uL (ref 150.0–400.0)
RBC: 4.2 Mil/uL (ref 3.87–5.11)
RDW: 13.2 % (ref 11.5–15.5)
WBC: 11 10*3/uL — ABNORMAL HIGH (ref 4.0–10.5)

## 2015-07-08 LAB — CREATININE, SERUM
Creatinine, Ser: 0.82 mg/dL (ref 0.44–1.00)
GFR calc Af Amer: >60 mL/min (ref >60)
GFR calc non Af Amer: >60 mL/min (ref >60)

## 2015-07-08 MED ORDER — CO Q-10 100 MG PO CAPS: ORAL_CAPSULE | Freq: Every day | ORAL | Status: DC

## 2015-07-08 MED ORDER — ADULT MULTIVITAMIN W/MINERALS CH
1.0000 | ORAL_TABLET | Freq: Every day | ORAL | Status: DC
  Administered 2015-07-08 – 2015-07-13 (×6): 1 via ORAL
  Filled 2015-07-08 (×6): qty 1

## 2015-07-08 MED ORDER — TRIAMCINOLONE ACETONIDE 55 MCG/ACT NA AERO
2.0000 | INHALATION_SPRAY | Freq: Every day | NASAL | Status: DC
  Administered 2015-07-09 – 2015-07-12 (×4): 2 via NASAL
  Filled 2015-07-08: qty 21.6

## 2015-07-08 MED ORDER — OMEGA-3 1000 MG PO CAPS
1000.0000 mg | ORAL_CAPSULE | Freq: Every day | ORAL | Status: DC | PRN
  Filled 2015-07-08: qty 1

## 2015-07-08 MED ORDER — VITAMIN B-1 100 MG PO TABS
100.0000 mg | ORAL_TABLET | Freq: Every day | ORAL | Status: DC
Start: 2015-07-08 — End: 2015-07-13
  Administered 2015-07-08 – 2015-07-13 (×6): 100 mg via ORAL
  Filled 2015-07-08 (×6): qty 1

## 2015-07-08 MED ORDER — MAGNESIUM 200 MG PO TABS: ORAL_TABLET | Freq: Every day | ORAL | Status: DC | PRN

## 2015-07-08 MED ORDER — ACETAMINOPHEN 325 MG PO TABS
650.0000 mg | ORAL_TABLET | Freq: Four times a day (QID) | ORAL | Status: DC | PRN
  Administered 2015-07-10 – 2015-07-12 (×4): 650 mg via ORAL
  Filled 2015-07-08 (×4): qty 2

## 2015-07-08 MED ORDER — FOLIC ACID 1 MG PO TABS
1.0000 mg | ORAL_TABLET | Freq: Every day | ORAL | Status: DC
  Administered 2015-07-08 – 2015-07-13 (×6): 1 mg via ORAL
  Filled 2015-07-08 (×6): qty 1

## 2015-07-08 MED ORDER — ONDANSETRON HCL 4 MG PO TABS: 4.0000 mg | ORAL_TABLET | Freq: Four times a day (QID) | ORAL | Status: DC | PRN

## 2015-07-08 MED ORDER — ACETAMINOPHEN 650 MG RE SUPP: 650.0000 mg | Freq: Four times a day (QID) | RECTAL | Status: DC | PRN

## 2015-07-08 MED ORDER — POLYVINYL ALCOHOL 1.4 % OP SOLN
1.0000 [drp] | Freq: Every day | OPHTHALMIC | Status: DC | PRN
  Filled 2015-07-08: qty 15

## 2015-07-08 MED ORDER — CALCIUM CARBONATE 1500 (600 CA) MG PO TABS
1500.0000 mg | ORAL_TABLET | Freq: Two times a day (BID) | ORAL | Status: DC
  Administered 2015-07-09 – 2015-07-10 (×4): 1500 mg via ORAL
  Filled 2015-07-08 (×8): qty 1

## 2015-07-08 MED ORDER — HEPARIN SODIUM (PORCINE) 5000 UNIT/ML IJ SOLN
5000.0000 [IU] | Freq: Three times a day (TID) | INTRAMUSCULAR | Status: DC
  Administered 2015-07-08 – 2015-07-10 (×5): 5000 [IU] via SUBCUTANEOUS
  Filled 2015-07-08 (×7): qty 1

## 2015-07-08 MED ORDER — IOHEXOL 300 MG/ML  SOLN
100.0000 mL | Freq: Once | INTRAMUSCULAR | Status: AC | PRN
  Administered 2015-07-08: 100 mL via INTRAVENOUS

## 2015-07-08 MED ORDER — MONTELUKAST SODIUM 10 MG PO TABS
10.0000 mg | ORAL_TABLET | Freq: Every day | ORAL | Status: DC
  Administered 2015-07-08 – 2015-07-12 (×5): 10 mg via ORAL
  Filled 2015-07-08 (×5): qty 1

## 2015-07-08 MED ORDER — IOHEXOL 300 MG/ML  SOLN
50.0000 mL | Freq: Once | INTRAMUSCULAR | Status: AC | PRN
  Administered 2015-07-08: 50 mL via ORAL

## 2015-07-08 MED ORDER — CARBOXYMETHYLCELLUL-GLYCERIN 0.5-0.9 % OP SOLN: Freq: Every day | OPHTHALMIC | Status: DC | PRN

## 2015-07-08 MED ORDER — ATORVASTATIN CALCIUM 10 MG PO TABS
20.0000 mg | ORAL_TABLET | ORAL | Status: DC
  Administered 2015-07-10: 20 mg via ORAL
  Filled 2015-07-08: qty 2

## 2015-07-08 MED ORDER — PIPERACILLIN-TAZOBACTAM 3.375 G IVPB
3.3750 g | Freq: Three times a day (TID) | INTRAVENOUS | Status: DC
  Administered 2015-07-08 – 2015-07-12 (×12): 3.375 g via INTRAVENOUS
  Filled 2015-07-08 (×11): qty 50

## 2015-07-08 MED ORDER — MORPHINE SULFATE (PF) 2 MG/ML IV SOLN
1.0000 mg | INTRAVENOUS | Status: DC | PRN
  Filled 2015-07-08: qty 1

## 2015-07-08 MED ORDER — GABAPENTIN 300 MG PO CAPS
300.0000 mg | ORAL_CAPSULE | Freq: Every day | ORAL | Status: DC
  Administered 2015-07-08 – 2015-07-12 (×5): 300 mg via ORAL
  Filled 2015-07-08 (×5): qty 1

## 2015-07-08 MED ORDER — ONDANSETRON HCL 4 MG/2ML IJ SOLN: 4.0000 mg | Freq: Four times a day (QID) | INTRAMUSCULAR | Status: DC | PRN

## 2015-07-08 MED ORDER — SODIUM CHLORIDE 0.9 % IV SOLN
INTRAVENOUS | Status: DC
Start: 2015-07-08 — End: 2015-07-13
  Administered 2015-07-08: 21:00:00 via INTRAVENOUS

## 2015-07-08 MED ORDER — CIPROFLOXACIN IN D5W 400 MG/200ML IV SOLN
400.0000 mg | Freq: Two times a day (BID) | INTRAVENOUS | Status: DC
  Administered 2015-07-08: 400 mg via INTRAVENOUS
  Filled 2015-07-08: qty 200

## 2015-07-08 MED ORDER — VALACYCLOVIR HCL 500 MG PO TABS
500.0000 mg | ORAL_TABLET | Freq: Every day | ORAL | Status: DC
  Administered 2015-07-08 – 2015-07-13 (×6): 500 mg via ORAL
  Filled 2015-07-08 (×7): qty 1

## 2015-07-08 MED ORDER — ASPIRIN EC 81 MG PO TBEC
81.0000 mg | DELAYED_RELEASE_TABLET | Freq: Every day | ORAL | Status: DC
  Administered 2015-07-08 – 2015-07-13 (×6): 81 mg via ORAL
  Filled 2015-07-08 (×6): qty 1

## 2015-07-08 MED ORDER — LORATADINE 10 MG PO TABS: 10.0000 mg | ORAL_TABLET | Freq: Every day | ORAL | Status: DC

## 2015-07-08 MED ORDER — MAGNESIUM OXIDE 400 (241.3 MG) MG PO TABS: 400.0000 mg | ORAL_TABLET | Freq: Every day | ORAL | Status: DC | PRN

## 2015-07-08 NOTE — Progress Notes (Signed)

## 2015-07-08 NOTE — Patient Instructions (Signed)
Go to [place]. You have a bed on [address].   Dr. Erlinda Hong is admitting doctor but may not be the physician to see you  I am also calling the surgeon to let him know you are coming

## 2015-07-08 NOTE — Progress Notes (Signed)
Patient ID: Nicole Dawson, female   DOB: [birth year], 64 y.o.   MRN: [id number]

## 2015-07-08 NOTE — Consult Note (Signed)
Reason for Consult:  Sigmoid diverticulitis Referring Physician: Dr. Dillard Cannon PCP: Garret Reddish, MD   Nicole Dawson is an 64 y.o. female.  HPI: Pt presented  to PCP with diarrhea that start on 07/03/15. It continued till 07/06/15 in the AM, she had her last loose stool. She treated this with Imodium at home.   She later developed cramping that got progressively worse thru the PM of 07/06/15.   She was seen 07/07/15 with elevated WBC and CT scan obtained as an outpatient today.  This shows:  Colonic diverticulosis with active inflammation around a proximal sigmoid diverticulum. The sigmoid mesentery is diffusely edematous/inflamed, with few bubbles of extraluminal intraperitoneal gas in the pelvis. There is no abscess. Oral contrast did not reach the site of perforation, but given small volume gas contrast leakage is not expected. Appendectomy. No obstruction.    Labs yesterday shows a WBC ow 16.8, today it is down to 11.0.  Bilirubin is up some yesterday, but better this AM. She was directly admitted from Dr. Ansel Bong office and we are ask to see.  Past Medical History  Diagnosis Date  . Sigmoid diverticulitis 07/08/2015     IBS (irritable bowel syndrome) 07/08/2015   Diagnosis by Dr. Conley Canal her prior PCP     Hx of tobacco use, presenting hazards to health 07/08/2015   22 years at 1 PPD     Depression   Heart murmur   Cancer [facility])    breast  Genital herpes   Allergy        Past Surgical History  Procedure Laterality Date  . Appendectomy    . Tubal ligation    . Bilateral total mastectomy with axillary lymph node dissection [facility] [place] [facility] 2005    bilateral mastectomy  . Breast reconstruction  2005    Family History  Problem Relation Age of Onset  . Alcohol abuse      fhx  . Hyperlipidemia      fhx  . Hypertension      fhx  . Cancer      lung/fhx  . Stroke Mother     in 56s.   Marland Kitchen Heart attack Father     85.   . Tuberculosis Father     1/2 of lung  removed.     Social History:  reports that she has quit smoking. Her smoking use included Cigarettes. She has a 30 pack-year smoking history. She has never used smokeless tobacco. She reports that she does not drink alcohol or use illicit drugs. Tobacco:  22 years 1 PPD ETOH:  None Drugs:  None   Allergies:  Allergies  Allergen Reactions  . Hydrocod Polst-Cpm Polst Er   . Mometasone Furoate     Medications:  Prior to Admission:  Prescriptions prior to admission  Medication Sig Dispense Refill Last Dose  . albuterol (PROAIR HFA) 108 (90 BASE) MCG/ACT inhaler Inhale 2 puffs into the lungs every 6 (six) hours as needed for wheezing. (Patient not taking: Reported on 07/08/2015) 1 Inhaler 0 Not Taking  . atorvastatin (LIPITOR) 20 MG tablet Take 1 tablet (20 mg total) by mouth 3 (three) times a week. 90 tablet 0 Taking  . Calcium Carbonate (CALTRATE 600) 1500 MG TABS Take by mouth 2 (two) times daily.     Taking  . cetirizine (ZYRTEC) 10 MG tablet Take 10 mg by mouth daily.     Taking  . CHERATUSSIN AC 100-10 MG/5ML syrup TAKE ONE TEASPOONFUL BY MOUTH EVERY 8  HOURS AS NEEDED FOR COUGH (Patient not taking: Reported on 07/08/2015) 180 mL 0 Not Taking  . Coenzyme Q10 (CO Q-10) 100 MG CAPS Take by mouth every other day.   Taking  . gabapentin (NEURONTIN) 100 MG capsule TAKE ONE TO THREE CAPSULES BY MOUTH THREE TIMES DAILY 90 capsule 3 Taking  . montelukast (SINGULAIR) 10 MG tablet Take 10 mg by mouth at bedtime.   Taking  . Mouthwashes (GREEN MOUTHWASH) 18.5-0.45-0.005 % LIQD Take 10 mLs by mouth 3 (three) times daily. Swish and spit 720 mL 0 Taking  . Multiple Vitamin (MULTIVITAMIN) tablet Take 1 tablet by mouth daily.     Taking  . NASONEX 50 MCG/ACT nasal spray INSTILL 2 SPRAYS INTO EACH NOSTRIL ONCE EVERDAY (Patient not taking: Reported on 07/05/2014) 17 g 6 Not Taking  . triamcinolone (NASACORT AQ) 55 MCG/ACT AERO nasal inhaler Place 2 sprays into the nose daily. (Patient not taking: Reported  on 07/05/2014) 1 Inhaler 12 Not Taking  . valACYclovir (VALTREX) 500 MG tablet Take 500 mg by mouth daily.   Taking   Scheduled: Continuous: PRN: Anti-infectives    Start     Dose/Rate Route Frequency Ordered Stop   07/08/15 1700  piperacillin-tazobactam (ZOSYN) IVPB 3.375 g     3.375 g 12.5 mL/hr over 240 Minutes Intravenous Every 8 hours 07/08/15 1623        Results for orders placed or performed in visit on 07/08/15 (from the past 48 hour(s))  CBC with Differential     Status: Abnormal   Collection Time: 07/08/15  1:56 PM  Result Value Ref Range   WBC 11.0 (H) 4.0 - 10.5 K/uL   RBC 4.20 3.87 - 5.11 Mil/uL   Hemoglobin 13.4 12.0 - 15.0 g/dL   HCT 39.8 36.0 - 46.0 %   MCV 94.8 78.0 - 100.0 fl   MCHC 33.7 30.0 - 36.0 g/dL   RDW 13.2 11.5 - 15.5 %   Platelets 231.0 150.0 - 400.0 K/uL   Neutrophils Relative % 86.3 Repeated and verified X2. (H) 43.0 - 77.0 %   Lymphocytes Relative 9.1 (L) 12.0 - 46.0 %   Monocytes Relative 4.2 3.0 - 12.0 %   Eosinophils Relative 0.2 0.0 - 5.0 %   Basophils Relative 0.2 0.0 - 3.0 %   Neutro Abs 9.5 (H) 1.4 - 7.7 K/uL   Lymphs Abs 1.0 0.7 - 4.0 K/uL   Monocytes Absolute 0.5 0.1 - 1.0 K/uL   Eosinophils Absolute 0.0 0.0 - 0.7 K/uL   Basophils Absolute 0.0 0.0 - 0.1 K/uL  CMP     Status: Abnormal   Collection Time: 07/08/15  1:56 PM  Result Value Ref Range   Sodium 139 135 - 145 mEq/L   Potassium 4.3 3.5 - 5.1 mEq/L   Chloride 103 96 - 112 mEq/L   CO2 31 19 - 32 mEq/L   Glucose, Bld 151 (H) 70 - 99 mg/dL   BUN 11 6 - 23 mg/dL   Creatinine, Ser 0.91 0.40 - 1.20 mg/dL   Total Bilirubin 1.6 (H) 0.2 - 1.2 mg/dL   Alkaline Phosphatase 104 39 - 117 U/L   AST 49 (H) 0 - 37 U/L   ALT 63 (H) 0 - 35 U/L   Total Protein 7.3 6.0 - 8.3 g/dL   Albumin 3.7 3.5 - 5.2 g/dL   Calcium 9.4 8.4 - 10.5 mg/dL   GFR 66.47 >60.00 mL/min    Ct Abdomen Pelvis W Contrast  07/08/2015  CLINICAL DATA:  Left lower and right upper quadrant abdominal pain with fever  and leukocytosis. EXAM: CT ABDOMEN AND PELVIS WITH CONTRAST TECHNIQUE: Multidetector CT imaging of the abdomen and pelvis was performed using the standard protocol following bolus administration of intravenous contrast. CONTRAST:  110mL OMNIPAQUE IOHEXOL 300 MG/ML  SOLN COMPARISON:  None. FINDINGS: Lower chest and abdominal wall:  No contributory findings. Hepatobiliary: No focal liver abnormality.No evidence of biliary obstruction or stone. Pancreas: Unremarkable. Spleen: Unremarkable. Adrenals/Urinary Tract: Negative adrenals. No hydronephrosis or stone. Renal sinus cysts, better seen on the right. Unremarkable bladder. Reproductive:No pathologic findings. Stomach/Bowel: Colonic diverticulosis with active inflammation around a proximal sigmoid diverticulum. The sigmoid mesentery is diffusely edematous/inflamed, with few bubbles of extraluminal intraperitoneal gas in the pelvis. There is no abscess. Oral contrast did not reach the site of perforation, but given small volume gas contrast leakage is not expected. Appendectomy. No obstruction. Vascular/Lymphatic: No acute vascular abnormality. No mass or adenopathy. Peritoneal: No ascites or pneumoperitoneum. Musculoskeletal: No acute abnormalities. These results were called by telephone at the time of interpretation on 07/08/2015 at 11:59 am to Dr. Garret Reddish , who verbally acknowledged these results. IMPRESSION: Sigmoid diverticulitis complicated by small pneumoperitoneum in the pelvis. Electronically Signed   By: Monte Fantasia M.D.   On: 07/08/2015 11:59    Review of Systems  Constitutional: Positive for fever (low grade at home 07/06/15). Negative for chills, weight loss, malaise/fatigue and diaphoresis.  HENT: Negative.   Eyes: Negative.   Respiratory: Negative.   Cardiovascular: Negative.   Gastrointestinal: Positive for nausea (some on 07/06/15 PM), abdominal pain (pain started as cramping got progressively worse over 24 hours starting on 07/06/15),  diarrhea (This started 07/03/15 and continued till AM 07/06/15.  she was taking OTC Imodium at home for this) and constipation (NO BM since AM of 07/06/15.). Negative for heartburn, vomiting, blood in stool and melena.  Genitourinary: Negative.   Musculoskeletal: Negative.   Skin: Negative.   Neurological: Negative.  Negative for weakness.  Endo/Heme/Allergies: Bruises/bleeds easily.  Psychiatric/Behavioral: Negative.    Blood pressure 145/71, pulse 88, temperature 98 F (36.7 C), temperature source Oral, resp. rate 16, height 5\' 2"  (1.575 m), weight 72.576 kg (160 lb), SpO2 100 %. Physical Exam  Constitutional: She is oriented to person, place, and time. She appears well-developed and well-nourished. No distress.  HENT:  Head: Normocephalic and atraumatic.  Nose: Nose normal.  Eyes: Conjunctivae and EOM are normal. Right eye exhibits no discharge. Left eye exhibits no discharge. No scleral icterus.  Neck: Normal range of motion. Neck supple. No JVD present. No tracheal deviation present. No thyromegaly present.  Cardiovascular: Normal rate, regular rhythm, normal heart sounds and intact distal pulses.   No murmur heard. Respiratory: Effort normal and breath sounds normal. No respiratory distress. She has no wheezes. She has no rales. She exhibits no tenderness.  GI: Soft. She exhibits no distension and no mass. There is tenderness (she is tender all over but below the umbilicus is more tender than above.). There is no rebound and no guarding.  BS are hyperactive   Musculoskeletal: She exhibits edema. She exhibits no tenderness.  Lymphadenopathy:    She has no cervical adenopathy.  Neurological: She is alert and oriented to person, place, and time. No cranial nerve deficit.  Skin: Skin is warm and dry. No rash noted. She is not diaphoretic. No erythema. No pallor.  Psychiatric: She has a normal mood and affect. Her behavior is normal. Judgment and thought content normal.  Assessment/Plan: Sigmoid diverticulitis with microperforation Hx of IBS Hx of tobacco use Restless leg syndrome -  On Neurontin  History of Herpes - on Valtrex Hx of depression\  Hx of  Right Breast cancer with bilateral mastectomies 2005  Plan:  Medical management, start on Zosyn and follow course.  We can repeat the Ct in about 5 days, sooner if she becomes more symptomatic.  Recheck labs in AM.    Earnstine Regal 07/08/2015, 5:02 PM

## 2015-07-08 NOTE — Progress Notes (Addendum)
Direct admission for Dr. Yong Channel.   Nicole Dawson is a 58yr with h/o breast cancer( right carcinoma in situ) s/p bilateral mastectomy and reconstruction in 2004, in remission per Dr. Yong Channel. Patient was seen by pmd Dr. Yong Channel yesterday due ab pain, has CT ab today showed sigmoid diverticulitis with small penumoperitoneum in the pelvis. patietn was called back to pmd office today, per pmd Dr Yong Channel patient is stable, he want patient direct admitted to med surg floor with iv abx. Dr hunter states he will inform general surgery as well. He states STAT labs was drawn in his office, result is pending.  Admit to med surg, need iv abx and general surgery consult.  I have talked to general surgery PA Will, he would like the floor page general surgery upon patient 's arrival, care order placed in epic for Floor to page general surgery.

## 2015-07-08 NOTE — H&P (Signed)
[facility] History and Physical  Nicole Dawson [id number] DOB: November 24, 1950 DOA: 07/08/2015  Referring physician: Garret Reddish, MD PCP: Garret Reddish, MD   Chief Complaint: Abdominal Pain  HPI: Nicole Dawson is a 64 y.o. female with history of breast cancer HLD IBD tobacco use depression presents from her PCP for abdominal pain and diarrhea. Patient has been having diarrhea and abdominal pain for the last several days. Patient took Imodium and an outpatient on her own. She went to her PCP on 12/5 and was though to have gastroenteritis but no clear etiology. Labs were drawn at that time also and these were abnormal with an elevated WBC. CT scan was ordered and the CT scan shows presence of sigmoid diverticulitis with a small pneumoperitoneum. Since her pain was worsening she was sent for admission with a surgical consult. Patient has been seen by surgery and they have suggested starting on zosyn. It should be noted that her WBC is down compared to yesterday. She is still having some pain and she has no nausea or vomiting. Patient has no fever noted presently. There is no chest pain noted. No swelling of her legs noted.   Review of Systems:  Complete 12 point ROS performed and is unremarkable other than HPI.   Past Medical History  Diagnosis Date  . Allergy   . Depression   . Heart murmur   . Cancer (HCC)     breast  . Genital herpes   . IBS (irritable bowel syndrome) 07/08/2015    Diagnosis by Dr. Conley Canal her prior PCP  . Sigmoid diverticulitis 07/08/2015  . Hx of tobacco use, presenting hazards to health 07/08/2015    22 years at 1 PPD  . PONV (postoperative nausea and vomiting)    Past Surgical History  Procedure Laterality Date  . Appendectomy    . Tubal ligation    . Bilateral total mastectomy with axillary lymph node dissection  2005    bilateral mastectomy  . Breast reconstruction  2005   Social History:  reports that she quit smoking about 23 years ago.  Her smoking use included Cigarettes. She has a 30 pack-year smoking history. She has never used smokeless tobacco. She reports that she does not drink alcohol or use illicit drugs.  Allergies  Allergen Reactions  . Hydrocod Polst-Cpm Polst Er     Patient says shes not allergic to tussionex  . Mometasone Furoate     Family History  Problem Relation Age of Onset  . Alcohol abuse      fhx  . Hyperlipidemia      fhx  . Hypertension      fhx  . Cancer      lung/fhx  . Stroke Mother     in 43s.   Marland Kitchen Heart attack Father     67.   . Tuberculosis Father     1/2 of lung removed.      Prior to Admission medications   Medication Sig Start Date End Date Taking? Authorizing Provider  aspirin EC 81 MG tablet Take 81 mg by mouth daily.   Yes Historical Provider, MD  atorvastatin (LIPITOR) 20 MG tablet Take 1 tablet (20 mg total) by mouth 3 (three) times a week. Patient taking differently: Take 20 mg by mouth 2 (two) times a week.  03/04/15  Yes Marin Olp, MD  Calcium Carbonate (CALTRATE 600) 1500 MG TABS Take by mouth 2 (two) times daily.     Yes Historical Provider, MD  Carboxymethylcellul-Glycerin ([place]  EYE DROPS OP) Apply 1 drop to eye daily as needed (dry eyes).   Yes Historical Provider, MD  cetirizine (ZYRTEC) 10 MG tablet Take 10 mg by mouth daily as needed for allergies.    Yes Historical Provider, MD  CHERATUSSIN AC 100-10 MG/5ML syrup TAKE ONE TEASPOONFUL BY MOUTH EVERY 8 HOURS AS NEEDED FOR COUGH 01/30/15  Yes Marin Olp, MD  Coenzyme Q10 (CO Q-10) 100 MG CAPS Take by mouth daily.    Yes Historical Provider, MD  gabapentin (NEURONTIN) 100 MG capsule TAKE ONE TO THREE CAPSULES BY MOUTH THREE TIMES DAILY Patient taking differently: take 300 mg at bedtime 06/02/15  Yes Marin Olp, MD  MAGNESIUM PO Take 1 capsule by mouth daily as needed (constipation).   Yes Historical Provider, MD  montelukast (SINGULAIR) 10 MG tablet Take 10 mg by mouth at bedtime.   Yes  Historical Provider, MD  Multiple Vitamin (MULTIVITAMIN) tablet Take 1 tablet by mouth daily.     Yes Historical Provider, MD  Omega-3 1000 MG CAPS Take 1 g by mouth daily as needed (constipation).    Yes Historical Provider, MD  triamcinolone (NASACORT AQ) 55 MCG/ACT AERO nasal inhaler Place 2 sprays into the nose daily. 05/30/13  Yes Biagio Borg, MD  valACYclovir (VALTREX) 500 MG tablet Take 500 mg by mouth daily.   Yes Historical Provider, MD   Physical Exam: Filed Vitals:   07/08/15 1618  BP: 145/71  Pulse: 88  Temp: 98 F (36.7 C)  TempSrc: Oral  Resp: 16  Height: 5\' 2"  (1.575 m)  Weight: 72.576 kg (160 lb)  SpO2: 100%    Wt Readings from Last 3 Encounters:  07/08/15 72.576 kg (160 lb)  07/08/15 72.576 kg (160 lb)  07/07/15 72.576 kg (160 lb)    General:  Appears calm and comfortable Eyes: PERRL, normal lids, irises & conjunctiva ENT: grossly normal hearing, lips & tongue Neck: no LAD, masses or thyromegaly Cardiovascular: RRR, no m/r/g. No LE edema Respiratory: CTA bilaterally, no w/r/r. Normal respiratory effort. Abdomen: soft, ++tenderness with slight rebound noted Skin: no rash or induration seen on limited exam Musculoskeletal: grossly normal tone BUE/BLE Psychiatric: grossly normal mood and affect Neurologic: grossly non-focal.          Labs on Admission:  Basic Metabolic Panel:  Recent Labs Lab 07/07/15 1211 07/08/15 1356  NA 141 139  K 4.3 4.3  CL 102 103  CO2 29 31  GLUCOSE 107* 151*  BUN 14 11  CREATININE 0.93 0.91  CALCIUM 9.6 9.4   Liver Function Tests:  Recent Labs Lab 07/07/15 1211 07/08/15 1356  AST 15 49*  ALT 21 63*  ALKPHOS 88 104  BILITOT 2.6* 1.6*  PROT 6.8 7.3  ALBUMIN 4.0 3.7    Recent Labs Lab 07/07/15 1211  LIPASE 11.0   No results for input(s): AMMONIA in the last 168 hours. CBC:  Recent Labs Lab 07/07/15 1211 07/08/15 1356  WBC 16.8* 11.0*  NEUTROABS 14.5* 9.5*  HGB 14.2 13.4  HCT 41.8 39.8  MCV 95.0  94.8  PLT 242.0 231.0   Cardiac Enzymes: No results for input(s): CKTOTAL, CKMB, CKMBINDEX, TROPONINI in the last 168 hours.  BNP (last 3 results) No results for input(s): BNP in the last 8760 hours.  ProBNP (last 3 results) No results for input(s): PROBNP in the last 8760 hours.  CBG: No results for input(s): GLUCAP in the last 168 hours.  Radiological Exams on Admission: Ct Abdomen Pelvis W Contrast  07/08/2015  CLINICAL DATA:  Left lower and right upper quadrant abdominal pain with fever and leukocytosis. EXAM: CT ABDOMEN AND PELVIS WITH CONTRAST TECHNIQUE: Multidetector CT imaging of the abdomen and pelvis was performed using the standard protocol following bolus administration of intravenous contrast. CONTRAST:  169mL OMNIPAQUE IOHEXOL 300 MG/ML  SOLN COMPARISON:  None. FINDINGS: Lower chest and abdominal wall:  No contributory findings. Hepatobiliary: No focal liver abnormality.No evidence of biliary obstruction or stone. Pancreas: Unremarkable. Spleen: Unremarkable. Adrenals/Urinary Tract: Negative adrenals. No hydronephrosis or stone. Renal sinus cysts, better seen on the right. Unremarkable bladder. Reproductive:No pathologic findings. Stomach/Bowel: Colonic diverticulosis with active inflammation around a proximal sigmoid diverticulum. The sigmoid mesentery is diffusely edematous/inflamed, with few bubbles of extraluminal intraperitoneal gas in the pelvis. There is no abscess. Oral contrast did not reach the site of perforation, but given small volume gas contrast leakage is not expected. Appendectomy. No obstruction. Vascular/Lymphatic: No acute vascular abnormality. No mass or adenopathy. Peritoneal: No ascites or pneumoperitoneum. Musculoskeletal: No acute abnormalities. These results were called by telephone at the time of interpretation on 07/08/2015 at 11:59 am to Dr. Garret Reddish , who verbally acknowledged these results. IMPRESSION: Sigmoid diverticulitis complicated by small  pneumoperitoneum in the pelvis. Electronically Signed   By: Monte Fantasia M.D.   On: 07/08/2015 11:59      Assessment/Plan Principal Problem:   Sigmoid diverticulitis Active Problems:   Hyperlipidemia   BREAST CANCER, HX OF   Restless leg syndrome   Herpes simplex   IBS (irritable bowel syndrome)   Hx of tobacco use, presenting hazards to health   1. Sigmoid Diverticulitis/Diarrhea -patient has been seen by General Surgery -suggested starting on zosyn at this time -will add cipro to her regimen -will consider repeat scan in a few days -will check C diff also  2. Hyperlipidemia -will continue with lipitor as ordered -lipid panel  3. History of tobacco use -she does not smoke presently  4. IBS -will monitor -she is not on any regular medications  5. RLS -will monitor -she is currently on gabapentin  6. Herpes Simplex -patient on chronic therapy will continue     Code Status: full code DVT Prophylaxis:heparin Family Communication: none Disposition Plan: home    [facility] Pager [phone]

## 2015-07-08 NOTE — Telephone Encounter (Signed)
Radiology called me: Diverticulitis complicated by pneumoperitoneum Patient will need  IV antibiotics No direct admission unless patient seen same day She will come to office for vitals and repeat exam and stat labs then will call for direct admission for IV antibiotics.

## 2015-07-08 NOTE — Progress Notes (Signed)
ANTIBIOTIC CONSULT NOTE - INITIAL  Pharmacy Consult for Zosyn & Cipro Indication: Intra-Abdominal Infection  Allergies  Allergen Reactions  . Hydrocod Polst-Cpm Polst Er     Patient says shes not allergic to tussionex  . Mometasone Furoate     Patient Measurements: Height: 5\' 2"  (157.5 cm) Weight: 160 lb (72.576 kg) IBW/kg (Calculated) : 50.1  Vital Signs: Temp: 98 F (36.7 C) (12/06 1618) Temp Source: Oral (12/06 1618) BP: 145/71 mmHg (12/06 1618) Pulse Rate: 88 (12/06 1618) Intake/Output from previous day:   Intake/Output from this shift:    Labs:  Recent Labs  07/07/15 1211 07/08/15 1356  WBC 16.8* 11.0*  HGB 14.2 13.4  PLT 242.0 231.0  CREATININE 0.93 0.91   Estimated Creatinine Clearance: 59.8 mL/min (by C-G formula based on Cr of 0.91). No results for input(s): VANCOTROUGH, VANCOPEAK, VANCORANDOM, GENTTROUGH, GENTPEAK, GENTRANDOM, TOBRATROUGH, TOBRAPEAK, TOBRARND, AMIKACINPEAK, AMIKACINTROU, AMIKACIN in the last 72 hours.   Microbiology: No results found for this or any previous visit (from the past 720 hour(s)).  Medical History: Past Medical History  Diagnosis Date  . Allergy   . Depression   . Heart murmur   . Cancer (HCC)     breast  . Genital herpes   . IBS (irritable bowel syndrome) 07/08/2015    Diagnosis by Dr. Conley Canal her prior PCP  . Sigmoid diverticulitis 07/08/2015  . Hx of tobacco use, presenting hazards to health 07/08/2015    22 years at 1 PPD  . PONV (postoperative nausea and vomiting)     Medications:  Scheduled:  . piperacillin-tazobactam (ZOSYN)  IV  3.375 g Intravenous Q8H   Infusions:   Assessment:  60 yr with PMH including breast cancer, IBD presents with abdominal pain and diarrhea.  CT has shown diverticulitis with pneumoperitoneum and due to worsening pain was admitted for a surgery consult.  Pharmacy consulted to dose Zosyn and Cipro for intra-abdominal infection  12/6 >>Zosyn >> 12/6 >>Cipro >>    12/6  CDiff:  Goal of Therapy:  Eradication of infection Appropriate dosing based on renal function  Plan:   Zosyn 3.375gm IV q8h (each dose infused over 4 hrs)  Cipro 400mg  IV q12h  Reshma Hoey, Toribio Harbour, PharmD 07/08/2015,7:14 PM

## 2015-07-08 NOTE — Progress Notes (Signed)
S: 69 F with hyperlipidemia, hx breast cancer who presented yesterday with diarrhea/lower abdominal pain and fever who was found to have WBC >16k and sent for stat CT this morning and found to have sigmoid diverticulitis complicated by small pneumoperitoneum. She has had worsening pain over last 24 hours. Plan is for direct admission to [facility].   ROS- subjective fevers, no nausea, vomiting Past medical history- hypertension, history breast cancer (treated 2005 ductal carcinoma in situ.   O: BP 122/72 mmHg  Temp(Src) 98.9 F (37.2 C)  Wt 160 lb (72.576 kg) Well appearing at rest RRR no wheeze rale or rhonchi CTAB no mrg With movement, moves slowly and appears uncomfortable Abdominal exam worsened from yesterday- still with RUQ and RLQ pain as well as LLQ pain. She has some rebound with RLQ palpation  A/P: 36F with hyperlipidemia presenting with Complicated diverticulitis with pneumoperitoneum Dr. Erlinda Hong accepting from [facility] accepting Bed control called and she has placement on 3 west Stat CBC, CMP pending Surgery consultation:Dr. Will Creig Hines will see patient if on floor before 4:30, otherwise will be night team. We will have to call back when patient on flo

## 2015-07-08 NOTE — Progress Notes (Signed)
Called the surgeon to report pt has arrived to 3W

## 2015-07-09 DIAGNOSIS — K5732 Diverticulitis of large intestine without perforation or abscess without bleeding: Principal | ICD-10-CM

## 2015-07-09 LAB — PROTIME-INR
INR: 1.19 (ref 0.00–1.49)
Prothrombin Time: 15.3 seconds — ABNORMAL HIGH (ref 11.6–15.2)

## 2015-07-09 LAB — COMPREHENSIVE METABOLIC PANEL
ALK PHOS: 106 U/L (ref 38–126)
ALT: 57 U/L — AB (ref 14–54)
ANION GAP: 8 (ref 5–15)
AST: 41 U/L (ref 15–41)
Albumin: 3.3 g/dL — ABNORMAL LOW (ref 3.5–5.0)
BILIRUBIN TOTAL: 1.3 mg/dL — AB (ref 0.3–1.2)
BUN: 6 mg/dL (ref 6–20)
CALCIUM: 8.9 mg/dL (ref 8.9–10.3)
CO2: 27 mmol/L (ref 22–32)
CREATININE: 0.76 mg/dL (ref 0.44–1.00)
Chloride: 105 mmol/L (ref 101–111)
GFR calc non Af Amer: >60 mL/min (ref >60)
GLUCOSE: 132 mg/dL — AB (ref 65–99)
Potassium: 3.8 mmol/L (ref 3.5–5.1)
SODIUM: 140 mmol/L (ref 135–145)
TOTAL PROTEIN: 6.7 g/dL (ref 6.5–8.1)

## 2015-07-09 LAB — CBC
HEMATOCRIT: 37.3 % (ref 36.0–46.0)
HEMOGLOBIN: 12.8 g/dL (ref 12.0–15.0)
MCH: 32.2 pg (ref 26.0–34.0)
MCHC: 34.3 g/dL (ref 30.0–36.0)
MCV: 93.7 fL (ref 78.0–100.0)
Platelets: 232 10*3/uL (ref 150–400)
RBC: 3.98 MIL/uL (ref 3.87–5.11)
RDW: 13.6 % (ref 11.5–15.5)
WBC: 8 10*3/uL (ref 4.0–10.5)

## 2015-07-09 LAB — TSH: TSH: 3.448 u[IU]/mL (ref 0.350–4.500)

## 2015-07-09 LAB — GLUCOSE, CAPILLARY: GLUCOSE-CAPILLARY: 148 mg/dL — AB (ref 65–99)

## 2015-07-09 MED ORDER — CETIRIZINE HCL 5 MG/5ML PO SYRP
5.0000 mg | ORAL_SOLUTION | Freq: Two times a day (BID) | ORAL | Status: DC
  Administered 2015-07-10 – 2015-07-13 (×7): 5 mg via ORAL
  Filled 2015-07-09 (×10): qty 5

## 2015-07-09 MED ORDER — CETIRIZINE HCL 5 MG/5ML PO SYRP: 5.0000 mg | ORAL_SOLUTION | Freq: Two times a day (BID) | ORAL | Status: DC

## 2015-07-09 MED ORDER — LORATADINE 10 MG PO TABS: 10.0000 mg | ORAL_TABLET | Freq: Every day | ORAL | Status: DC

## 2015-07-09 NOTE — Progress Notes (Signed)
I worked 3p-7p following Romie Minus and agree with her assessments and documentation

## 2015-07-09 NOTE — Progress Notes (Signed)
Nicole Dawson [id number] DOB: 11-Sep-1950 DOA: 07/08/2015 PCP: Garret Reddish, MD  Brief narrative:  64 y/o ? H/o BR Ca DCIS 2004 s/p bilat Mastectomies 2004,  BTL 1984  prior appendectomy GERD, HTN Bipolar  Admitted 12.6.16 with Abd doicomfort and pain as well as CT based evidence of diverticulitis   Past medical history-As per Problem list Chart reviewed as below-   Consultants:  Gen surg  Procedures:    Antibiotics:  Zosyn 07/08/15   Subjective     Objective    Interim History:   Telemetry:    Objective: Filed Vitals:   07/08/15 1618 07/08/15 2100 07/09/15 0703  BP: 145/71 124/62 122/59  Pulse: 88 97 74  Temp: 98 F (36.7 C) 98.1 F (36.7 C) 98.6 F (37 C)  TempSrc: Oral Oral Oral  Resp: 16 18 18   Height: 5\' 2"  (1.575 m)    Weight: 72.576 kg (160 lb)  71.5 kg (157 lb 10.1 oz)  SpO2: 100% 98% 97%    Intake/Output Summary (Last 24 hours) at 07/09/15 1322 Last data filed at 07/09/15 1021  Gross per 24 hour  Intake   1080 ml  Output   2700 ml  Net  -1620 ml    Exam:  General: eomi ncat pleasant in nad Cardiovascular: s1 s 2no m/r/g Respiratory: clear no added sound Abdomen:  Soft nt nd no rebound Skin no le edema Neuro intact, moves all 4 limbs =  Data Reviewed: Basic Metabolic Panel:  Recent Labs Lab 07/07/15 1211 07/08/15 1356 07/08/15 2120 07/09/15 0408  NA 141 139  --  140  K 4.3 4.3  --  3.8  CL 102 103  --  105  CO2 29 31  --  27  GLUCOSE 107* 151*  --  132*  BUN 14 11  --  6  CREATININE 0.93 0.91 0.82 0.76  CALCIUM 9.6 9.4  --  8.9   Liver Function Tests:  Recent Labs Lab 07/07/15 1211 07/08/15 1356 07/09/15 0408  AST 15 49* 41  ALT 21 63* 57*  ALKPHOS 88 104 106  BILITOT 2.6* 1.6* 1.3*  PROT 6.8 7.3 6.7  ALBUMIN 4.0 3.7 3.3*    Recent Labs Lab 07/07/15 1211  LIPASE 11.0   No results for input(s): AMMONIA in the last 168 hours. CBC:  Recent Labs Lab 07/07/15 1211 07/08/15 1356  07/08/15 2120 07/09/15 0408  WBC 16.8* 11.0* 8.8 8.0  NEUTROABS 14.5* 9.5*  --   --   HGB 14.2 13.4 13.3 12.8  HCT 41.8 39.8 36.9 37.3  MCV 95.0 94.8 92.3 93.7  PLT 242.0 231.0 PLATELET CLUMPS NOTED ON SMEAR, COUNT APPEARS DECREASED 232   Cardiac Enzymes: No results for input(s): CKTOTAL, CKMB, CKMBINDEX, TROPONINI in the last 168 hours. BNP: Invalid input(s): POCBNP CBG:  Recent Labs Lab 07/09/15 0811  GLUCAP 148*    Recent Results (from the past 240 hour(s))  Urine culture     Status: None (Preliminary result)   Collection Time: 07/07/15  1:38 PM  Result Value Ref Range Status   Colony Count 20,OOO COLONIES/ML  Preliminary   Preliminary Report ESCHERICHIA COLI  Preliminary     Studies:              All Imaging reviewed and is as per above notation   Scheduled Meds: . aspirin EC  81 mg Oral Daily  . [START ON 07/10/2015] atorvastatin  20 mg Oral Once per day on Mon Thu  . calcium carbonate  1,500  mg Oral BID WC  . folic acid  1 mg Oral Daily  . gabapentin  300 mg Oral QHS  . heparin  5,000 Units Subcutaneous 3 times per day  . loratadine  10 mg Oral Daily  . montelukast  10 mg Oral QHS  . multivitamin with minerals  1 tablet Oral Daily  . piperacillin-tazobactam (ZOSYN)  IV  3.375 g Intravenous Q8H  . thiamine  100 mg Oral Daily  . triamcinolone  2 spray Nasal Daily  . valACYclovir  500 mg Oral Daily   Continuous Infusions: . sodium chloride 50 mL/hr at 07/09/15 0018     Assessment/Plan:  1. Diverticulitis-uncomplicated-continue IV zosyn.  Monitor on cld for now.  appreciate Gen surgery input. hopeful for no surgical intervention.  Labs am 2. Rhinorrhea-has allergies-Claritin doesn't typically help-change to zyrtec and reassess-continue Montelukast and  Monitor 3. Bipolar-continue meds 4. Neuropathy NOs-continue Gabapentin 300 qhs 5. HLD-Continue Atorvastatin 20 per schdule 6. Shingles-continue Valacyclovir 500 qd   Verneita Griffes, MD  [facility] Pager [phone] 07/09/2015, 1:22 PM    LOS: 1 day

## 2015-07-09 NOTE — Progress Notes (Signed)
Subjective: She is much less tender than last PM, up getting redressed after sponge bath.    Objective: Vital signs in last 24 hours: Temp:  [98 F (36.7 C)-98.9 F (37.2 C)] 98.6 F (37 C) (12/07 0703) Pulse Rate:  [74-97] 74 (12/07 0703) Resp:  [16-18] 18 (12/07 0703) BP: (122-145)/(59-72) 122/59 mmHg (12/07 0703) SpO2:  [97 %-100 %] 97 % (12/07 0703) Weight:  [71.5 kg (157 lb 10.1 oz)-72.576 kg (160 lb)] 71.5 kg (157 lb 10.1 oz) (12/07 0703) Last BM Date: 07/06/15 840 PO  Full liquid diet Afebrile, VSS Labs OK WBC is normal this AM Intake/Output from previous day: 12/06 0701 - 12/07 0700 In: 840 [P.O.:840] Out: 2200 [Urine:2200] Intake/Output this shift:    General appearance: alert, cooperative and no distress GI: minimal tenderness this AM.  Tolerating full liquids.  Lab Results:   Recent Labs  07/08/15 2120 07/09/15 0408  WBC 8.8 8.0  HGB 13.3 12.8  HCT 36.9 37.3  PLT PLATELET CLUMPS NOTED ON SMEAR, COUNT APPEARS DECREASED 232    BMET  Recent Labs  07/08/15 1356 07/08/15 2120 07/09/15 0408  NA 139  --  140  K 4.3  --  3.8  CL 103  --  105  CO2 31  --  27  GLUCOSE 151*  --  132*  BUN 11  --  6  CREATININE 0.91 0.82 0.76  CALCIUM 9.4  --  8.9   PT/INR  Recent Labs  07/09/15 0408  LABPROT 15.3*  INR 1.19     Recent Labs Lab 07/07/15 1211 07/08/15 1356 07/09/15 0408  AST 15 49* 41  ALT 21 63* 57*  ALKPHOS 88 104 106  BILITOT 2.6* 1.6* 1.3*  PROT 6.8 7.3 6.7  ALBUMIN 4.0 3.7 3.3*     Lipase     Component Value Date/Time   LIPASE 11.0 07/07/2015 1211     Studies/Results: Ct Abdomen Pelvis W Contrast  07/08/2015  CLINICAL DATA:  Left lower and right upper quadrant abdominal pain with fever and leukocytosis. EXAM: CT ABDOMEN AND PELVIS WITH CONTRAST TECHNIQUE: Multidetector CT imaging of the abdomen and pelvis was performed using the standard protocol following bolus administration of intravenous contrast. CONTRAST:  169mL  OMNIPAQUE IOHEXOL 300 MG/ML  SOLN COMPARISON:  None. FINDINGS: Lower chest and abdominal wall:  No contributory findings. Hepatobiliary: No focal liver abnormality.No evidence of biliary obstruction or stone. Pancreas: Unremarkable. Spleen: Unremarkable. Adrenals/Urinary Tract: Negative adrenals. No hydronephrosis or stone. Renal sinus cysts, better seen on the right. Unremarkable bladder. Reproductive:No pathologic findings. Stomach/Bowel: Colonic diverticulosis with active inflammation around a proximal sigmoid diverticulum. The sigmoid mesentery is diffusely edematous/inflamed, with few bubbles of extraluminal intraperitoneal gas in the pelvis. There is no abscess. Oral contrast did not reach the site of perforation, but given small volume gas contrast leakage is not expected. Appendectomy. No obstruction. Vascular/Lymphatic: No acute vascular abnormality. No mass or adenopathy. Peritoneal: No ascites or pneumoperitoneum. Musculoskeletal: No acute abnormalities. These results were called by telephone at the time of interpretation on 07/08/2015 at 11:59 am to Dr. Garret Reddish , who verbally acknowledged these results. IMPRESSION: Sigmoid diverticulitis complicated by small pneumoperitoneum in the pelvis. Electronically Signed   By: Monte Fantasia M.D.   On: 07/08/2015 11:59    Medications: . aspirin EC  81 mg Oral Daily  . [START ON 07/10/2015] atorvastatin  20 mg Oral Once per day on Mon Thu  . calcium carbonate  1,500 mg Oral BID WC  . folic acid  1 mg Oral Daily  . gabapentin  300 mg Oral QHS  . heparin  5,000 Units Subcutaneous 3 times per day  . loratadine  10 mg Oral Daily  . montelukast  10 mg Oral QHS  . multivitamin with minerals  1 tablet Oral Daily  . piperacillin-tazobactam (ZOSYN)  IV  3.375 g Intravenous Q8H  . thiamine  100 mg Oral Daily  . triamcinolone  2 spray Nasal Daily  . valACYclovir  500 mg Oral Daily    Assessment/Plan Sigmoid diverticulitis with microperforation Hx of  IBS Hx of tobacco use Restless leg syndrome - On Neurontin  History of Herpes - on Valtrex Hx of depression\ Hx of Right Breast cancer with bilateral mastectomies 2005 Antibiotics:  Day 2 Zosyn DVT:  Heparin/SCD    Plan;  Continue IV antitbiotics for now.     LOS: 1 day    Ziere Docken 07/09/2015

## 2015-07-10 LAB — COMPREHENSIVE METABOLIC PANEL
ALBUMIN: 3.3 g/dL — AB (ref 3.5–5.0)
ALK PHOS: 139 U/L — AB (ref 38–126)
ALT: 56 U/L — ABNORMAL HIGH (ref 14–54)
ANION GAP: 7 (ref 5–15)
AST: 36 U/L (ref 15–41)
BUN: 6 mg/dL (ref 6–20)
CHLORIDE: 107 mmol/L (ref 101–111)
CO2: 26 mmol/L (ref 22–32)
Calcium: 9.3 mg/dL (ref 8.9–10.3)
Creatinine, Ser: 0.84 mg/dL (ref 0.44–1.00)
GFR calc Af Amer: >60 mL/min (ref >60)
GFR calc non Af Amer: >60 mL/min (ref >60)
GLUCOSE: 105 mg/dL — AB (ref 65–99)
POTASSIUM: 3.9 mmol/L (ref 3.5–5.1)
SODIUM: 140 mmol/L (ref 135–145)
Total Bilirubin: 1.1 mg/dL (ref 0.3–1.2)
Total Protein: 7 g/dL (ref 6.5–8.1)

## 2015-07-10 LAB — CBC
HCT: 37.6 % (ref 36.0–46.0)
HEMOGLOBIN: 13.1 g/dL (ref 12.0–15.0)
MCH: 32.4 pg (ref 26.0–34.0)
MCHC: 34.8 g/dL (ref 30.0–36.0)
MCV: 93.1 fL (ref 78.0–100.0)
PLATELETS: 236 10*3/uL (ref 150–400)
RBC: 4.04 MIL/uL (ref 3.87–5.11)
RDW: 13.3 % (ref 11.5–15.5)
WBC: 6.3 10*3/uL (ref 4.0–10.5)

## 2015-07-10 LAB — URINE CULTURE

## 2015-07-10 LAB — HEMOGLOBIN A1C
HEMOGLOBIN A1C: 5.7 % — AB (ref 4.8–5.6)
Mean Plasma Glucose: 117 mg/dL

## 2015-07-10 MED ORDER — DEXTROMETHORPHAN POLISTIREX ER 30 MG/5ML PO SUER
30.0000 mg | Freq: Two times a day (BID) | ORAL | Status: DC | PRN
  Administered 2015-07-10 – 2015-07-12 (×4): 30 mg via ORAL
  Filled 2015-07-10 (×6): qty 5

## 2015-07-10 NOTE — Progress Notes (Signed)
Nicole Dawson XN:[id number] DOB: 09-13-1950 DOA: 07/08/2015 PCP: Garret Reddish, MD  Brief narrative:  64 y/o ? H/o BR Ca DCIS 2004 s/p bilat Mastectomies 2004,  BTL 1984  prior appendectomy GERD, HTN Bipolar  Admitted 12.6.16 with Abd doicomfort and pain as well as CT based evidence of diverticulitis   Past medical history-As per Problem list Chart reviewed as below-   Consultants:  Gen surg  Procedures:    Antibiotics:  Zosyn 07/08/15   Subjective   Fair Some mild discomfort today No n/v ambulating ok Has rhinorrhea  No fever nor chills   Objective    Interim History:   Telemetry:    Objective: Filed Vitals:   07/09/15 0703 07/09/15 1511 07/09/15 2328 07/10/15 0525  BP: 122/59 131/54 118/60 123/62  Pulse: 74 81 86 72  Temp: 98.6 F (37 C) 99.1 F (37.3 C) 99.5 F (37.5 C) 98.3 F (36.8 C)  TempSrc: Oral Oral Oral Oral  Resp: 18 20 20 20   Height:      Weight: 71.5 kg (157 lb 10.1 oz)   71.4 kg (157 lb 6.5 oz)  SpO2: 97% 100% 97% 97%    Intake/Output Summary (Last 24 hours) at 07/10/15 1514 Last data filed at 07/10/15 [id number]  Gross per 24 hour  Intake 1872.5 ml  Output   2000 ml  Net -127.5 ml    Exam:  General: eomi ncat pleasant in nad Cardiovascular: s1 s 2no m/r/g Respiratory: clear no added sound Abdomen:  Soft nt nd no rebound Skin no le edema Neuro intact, moves all 4 limbs =  Data Reviewed: Basic Metabolic Panel:  Recent Labs Lab 07/07/15 1211 07/08/15 1356 07/08/15 2120 07/09/15 0408 07/10/15 0350  NA 141 139  --  140 140  K 4.3 4.3  --  3.8 3.9  CL 102 103  --  105 107  CO2 29 31  --  27 26  GLUCOSE 107* 151*  --  132* 105*  BUN 14 11  --  6 6  CREATININE 0.93 0.91 0.82 0.76 0.84  CALCIUM 9.6 9.4  --  8.9 9.3   Liver Function Tests:  Recent Labs Lab 07/07/15 1211 07/08/15 1356 07/09/15 0408 07/10/15 0350  AST 15 49* 41 36  ALT 21 63* 57* 56*  ALKPHOS 88 104 106 139*  BILITOT 2.6* 1.6* 1.3*  1.1  PROT 6.8 7.3 6.7 7.0  ALBUMIN 4.0 3.7 3.3* 3.3*    Recent Labs Lab 07/07/15 1211  LIPASE 11.0   No results for input(s): AMMONIA in the last 168 hours. CBC:  Recent Labs Lab 07/07/15 1211 07/08/15 1356 07/08/15 2120 07/09/15 0408 07/10/15 0350  WBC 16.8* 11.0* 8.8 8.0 6.3  NEUTROABS 14.5* 9.5*  --   --   --   HGB 14.2 13.4 13.3 12.8 13.1  HCT 41.8 39.8 36.9 37.3 37.6  MCV 95.0 94.8 92.3 93.7 93.1  PLT 242.0 231.0 PLATELET CLUMPS NOTED ON SMEAR, COUNT APPEARS DECREASED 232 236   Cardiac Enzymes: No results for input(s): CKTOTAL, CKMB, CKMBINDEX, TROPONINI in the last 168 hours. BNP: Invalid input(s): POCBNP CBG:  Recent Labs Lab 07/09/15 0811  GLUCAP 148*    Recent Results (from the past 240 hour(s))  Urine culture     Status: None   Collection Time: 07/07/15  1:38 PM  Result Value Ref Range Status   Culture ESCHERICHIA COLI  Final   Colony Count 20,OOO COLONIES/ML  Final   Organism ID, Bacteria ESCHERICHIA COLI  Final  Comment: Confirmed Extended Spectrum Beta-Lactamase Producer (ESBL)       Susceptibility   Escherichia coli -  (no method available)    AMPICILLIN >=32 Resistant     AMOX/CLAVULANIC 16 Intermediate     AMPICILLIN/SULBACTAM >=32 Resistant     PIP/TAZO <=4 Sensitive     IMIPENEM <=0.25 Sensitive     CEFAZOLIN >=64 Resistant     CEFTRIAXONE >=64 Resistant     CEFTAZIDIME  Resistant     CEFEPIME  Resistant     GENTAMICIN >=16 Resistant     TOBRAMYCIN >=16 Resistant     CIPROFLOXACIN >=4 Resistant     LEVOFLOXACIN >=8 Resistant     NITROFURANTOIN <=16 Sensitive     TRIMETH/SULFA* <=20 Sensitive      * NR=NOT REPORTABLE,SEE COMMENTORAL therapy:A cefazolin MIC of <32 predicts susceptibility to the oral agents cefaclor,cefdinir,cefpodoxime,cefprozil,cefuroxime,cephalexin,and loracarbef when used for therapy of uncomplicated UTIs due to E.coli,K.pneumomiae,and P.mirabilis. PARENTERAL therapy: A cefazolinMIC of >8 indicates resistance to  parenteralcefazolin. An alternate test method must beperformed to confirm susceptibility to parenteralcefazolin.     Studies:              All Imaging reviewed and is as per above notation   Scheduled Meds: . aspirin EC  81 mg Oral Daily  . atorvastatin  20 mg Oral Once per day on Mon Thu  . calcium carbonate  1,500 mg Oral BID WC  . cetirizine HCl  5 mg Oral BID  . folic acid  1 mg Oral Daily  . gabapentin  300 mg Oral QHS  . heparin  5,000 Units Subcutaneous 3 times per day  . montelukast  10 mg Oral QHS  . multivitamin with minerals  1 tablet Oral Daily  . piperacillin-tazobactam (ZOSYN)  IV  3.375 g Intravenous Q8H  . thiamine  100 mg Oral Daily  . triamcinolone  2 spray Nasal Daily  . valACYclovir  500 mg Oral Daily   Continuous Infusions: . sodium chloride 50 mL/hr at 07/09/15 0018     Assessment/Plan:  1. Diverticulitis-uncomplicated-continue IV zosyn.  Monitor on cld for now.  appreciate Gen surgery input. hopeful for no surgical intervention.  Rest per gen surg 2. ESBL + with asymptomatic bacteriuria-only 20,000 CFU-would not treat as pyelo-patient has no dysuria 3. Rhinorrhea-has allergies-Claritin doesn't typically help-change to zyrtec and reassess-continue Montelukast and  Monitor 4. Bipolar-continue meds 5. Neuropathy NOs-continue Gabapentin 300 qhs 6. HLD-Continue Atorvastatin 20 per schdule-LFT's are slighlty up.  Continue Statin but recheck LFT's in 2-3 mo 7. Shingles-continue Valacyclovir 500 qd   Verneita Griffes, MD  [facility] Pager [phone] 07/10/2015, 3:13 PM    LOS: 2 days

## 2015-07-10 NOTE — Progress Notes (Signed)
Subjective: Still having some mild pain, no nausea, tolerating diet, but was uncomfortable last PM after supper.  Objective: Vital signs in last 24 hours: Temp:  [98.3 F (36.8 C)-99.5 F (37.5 C)] 98.3 F (36.8 C) (12/08 0525) Pulse Rate:  [72-86] 72 (12/08 0525) Resp:  [20] 20 (12/08 0525) BP: (118-131)/(54-62) 123/62 mmHg (12/08 0525) SpO2:  [97 %-100 %] 97 % (12/08 0525) Weight:  [71.4 kg (157 lb 6.5 oz)] 71.4 kg (157 lb 6.5 oz) (12/08 0525) Last BM Date: 07/06/15 1080 PO  Full liquids Urine 3200 Afebrile, VSS Labs OK, WBC remains normal  CT was on 07/08/15 Intake/Output from previous day: 12/07 0701 - 12/08 0700 In: 2112.5 [P.O.:1080; I.V.:882.5; IV Piggyback:150] Out: 3200 [Urine:3200] Intake/Output this shift:    General appearance: alert, cooperative and no distress GI: soft, still some pain and discomfort lower abdomen.  + bS.  Lab Results:   Recent Labs  07/09/15 0408 07/10/15 0350  WBC 8.0 6.3  HGB 12.8 13.1  HCT 37.3 37.6  PLT 232 236    BMET  Recent Labs  07/09/15 0408 07/10/15 0350  NA 140 140  K 3.8 3.9  CL 105 107  CO2 27 26  GLUCOSE 132* 105*  BUN 6 6  CREATININE 0.76 0.84  CALCIUM 8.9 9.3   PT/INR  Recent Labs  07/09/15 0408  LABPROT 15.3*  INR 1.19     Recent Labs Lab 07/07/15 1211 07/08/15 1356 07/09/15 0408 07/10/15 0350  AST 15 49* 41 36  ALT 21 63* 57* 56*  ALKPHOS 88 104 106 139*  BILITOT 2.6* 1.6* 1.3* 1.1  PROT 6.8 7.3 6.7 7.0  ALBUMIN 4.0 3.7 3.3* 3.3*     Lipase     Component Value Date/Time   LIPASE 11.0 07/07/2015 1211     Studies/Results: Ct Abdomen Pelvis W Contrast  07/08/2015  CLINICAL DATA:  Left lower and right upper quadrant abdominal pain with fever and leukocytosis. EXAM: CT ABDOMEN AND PELVIS WITH CONTRAST TECHNIQUE: Multidetector CT imaging of the abdomen and pelvis was performed using the standard protocol following bolus administration of intravenous contrast. CONTRAST:  195mL  OMNIPAQUE IOHEXOL 300 MG/ML  SOLN COMPARISON:  None. FINDINGS: Lower chest and abdominal wall:  No contributory findings. Hepatobiliary: No focal liver abnormality.No evidence of biliary obstruction or stone. Pancreas: Unremarkable. Spleen: Unremarkable. Adrenals/Urinary Tract: Negative adrenals. No hydronephrosis or stone. Renal sinus cysts, better seen on the right. Unremarkable bladder. Reproductive:No pathologic findings. Stomach/Bowel: Colonic diverticulosis with active inflammation around a proximal sigmoid diverticulum. The sigmoid mesentery is diffusely edematous/inflamed, with few bubbles of extraluminal intraperitoneal gas in the pelvis. There is no abscess. Oral contrast did not reach the site of perforation, but given small volume gas contrast leakage is not expected. Appendectomy. No obstruction. Vascular/Lymphatic: No acute vascular abnormality. No mass or adenopathy. Peritoneal: No ascites or pneumoperitoneum. Musculoskeletal: No acute abnormalities. These results were called by telephone at the time of interpretation on 07/08/2015 at 11:59 am to Dr. Garret Reddish , who verbally acknowledged these results. IMPRESSION: Sigmoid diverticulitis complicated by small pneumoperitoneum in the pelvis. Electronically Signed   By: Monte Fantasia M.D.   On: 07/08/2015 11:59    Medications: . aspirin EC  81 mg Oral Daily  . atorvastatin  20 mg Oral Once per day on Mon Thu  . calcium carbonate  1,500 mg Oral BID WC  . cetirizine HCl  5 mg Oral BID  . folic acid  1 mg Oral Daily  . gabapentin  300  mg Oral QHS  . heparin  5,000 Units Subcutaneous 3 times per day  . montelukast  10 mg Oral QHS  . multivitamin with minerals  1 tablet Oral Daily  . piperacillin-tazobactam (ZOSYN)  IV  3.375 g Intravenous Q8H  . thiamine  100 mg Oral Daily  . triamcinolone  2 spray Nasal Daily  . valACYclovir  500 mg Oral Daily    Assessment/Plan Sigmoid diverticulitis with microperforation Hx of IBS Hx of tobacco  use Restless leg syndrome - On Neurontin  History of Herpes - on Valtrex Hx of depression\ Hx of Right Breast cancer with bilateral mastectomies 2005 Antibiotics: Day 3 Zosyn DVT: Heparin/SCD    Plan:  Continue IV antibiotics, when pain is resolved we can up her diet and consider changing over to PO antibiotics.      LOS: 2 days    Nicole Dawson 07/10/2015

## 2015-07-11 LAB — GLUCOSE, CAPILLARY: Glucose-Capillary: 135 mg/dL — ABNORMAL HIGH (ref 65–99)

## 2015-07-11 MED ORDER — CALCIUM CARBONATE 1250 (500 CA) MG PO TABS
1250.0000 mg | ORAL_TABLET | Freq: Two times a day (BID) | ORAL | Status: DC
  Administered 2015-07-11 – 2015-07-13 (×5): 1250 mg via ORAL
  Filled 2015-07-11 (×5): qty 1

## 2015-07-11 NOTE — Progress Notes (Signed)
Nicole Dawson MW:[id number] DOB: 08-13-1950 DOA: 07/08/2015 PCP: Garret Reddish, MD  Brief narrative:  64 y/o ? H/o BR Ca DCIS 2004 s/p bilat Mastectomies 2004,  BTL 1984  prior appendectomy GERD, HTN Bipolar  Admitted 12.6.16 with Abd doicomfort and pain as well as CT based evidence of diverticulitis   Past medical history-As per Problem list Chart reviewed as below-   Consultants:  Gen surg  Procedures:    Antibiotics:  Zosyn 07/08/15   Subjective   Well no issues tol some diet Passing gas ambulatory   Objective    Interim History:   Telemetry:    Objective: Filed Vitals:   07/10/15 1500 07/10/15 2132 07/11/15 0535 07/11/15 1500  BP: 121/62 147/57 115/64 119/60  Pulse: 77 87 72 82  Temp: 97.5 F (36.4 C) 98.3 F (36.8 C) 97.7 F (36.5 C) 98.7 F (37.1 C)  TempSrc: Oral Oral Oral Oral  Resp: 20 18 18 20   Height:      Weight:      SpO2: 98% 98% 97% 98%    Intake/Output Summary (Last 24 hours) at 07/11/15 1750 Last data filed at 07/11/15 1445  Gross per 24 hour  Intake    480 ml  Output   1500 ml  Net  -1020 ml    Exam:  General: eomi ncat pleasant in nad Cardiovascular: s1 s 2no m/r/g Respiratory: clear no added sound Abdomen:  Soft nt nd no rebound   Data Reviewed: Basic Metabolic Panel:  Recent Labs Lab 07/07/15 1211 07/08/15 1356 07/08/15 2120 07/09/15 0408 07/10/15 0350  NA 141 139  --  140 140  K 4.3 4.3  --  3.8 3.9  CL 102 103  --  105 107  CO2 29 31  --  27 26  GLUCOSE 107* 151*  --  132* 105*  BUN 14 11  --  6 6  CREATININE 0.93 0.91 0.82 0.76 0.84  CALCIUM 9.6 9.4  --  8.9 9.3   Liver Function Tests:  Recent Labs Lab 07/07/15 1211 07/08/15 1356 07/09/15 0408 07/10/15 0350  AST 15 49* 41 36  ALT 21 63* 57* 56*  ALKPHOS 88 104 106 139*  BILITOT 2.6* 1.6* 1.3* 1.1  PROT 6.8 7.3 6.7 7.0  ALBUMIN 4.0 3.7 3.3* 3.3*    Recent Labs Lab 07/07/15 1211  LIPASE 11.0   No results for input(s):  AMMONIA in the last 168 hours. CBC:  Recent Labs Lab 07/07/15 1211 07/08/15 1356 07/08/15 2120 07/09/15 0408 07/10/15 0350  WBC 16.8* 11.0* 8.8 8.0 6.3  NEUTROABS 14.5* 9.5*  --   --   --   HGB 14.2 13.4 13.3 12.8 13.1  HCT 41.8 39.8 36.9 37.3 37.6  MCV 95.0 94.8 92.3 93.7 93.1  PLT 242.0 231.0 PLATELET CLUMPS NOTED ON SMEAR, COUNT APPEARS DECREASED 232 236   Cardiac Enzymes: No results for input(s): CKTOTAL, CKMB, CKMBINDEX, TROPONINI in the last 168 hours. BNP: Invalid input(s): POCBNP CBG:  Recent Labs Lab 07/09/15 0811 07/11/15 0758  GLUCAP 148* 135*    Recent Results (from the past 240 hour(s))  Urine culture     Status: None   Collection Time: 07/07/15  1:38 PM  Result Value Ref Range Status   Culture ESCHERICHIA COLI  Final   Colony Count 20,OOO COLONIES/ML  Final   Organism ID, Bacteria ESCHERICHIA COLI  Final    Comment: Confirmed Extended Spectrum Beta-Lactamase Producer (ESBL)       Susceptibility   Escherichia coli -  (  no method available)    AMPICILLIN >=32 Resistant     AMOX/CLAVULANIC 16 Intermediate     AMPICILLIN/SULBACTAM >=32 Resistant     PIP/TAZO <=4 Sensitive     IMIPENEM <=0.25 Sensitive     CEFAZOLIN >=64 Resistant     CEFTRIAXONE >=64 Resistant     CEFTAZIDIME  Resistant     CEFEPIME  Resistant     GENTAMICIN >=16 Resistant     TOBRAMYCIN >=16 Resistant     CIPROFLOXACIN >=4 Resistant     LEVOFLOXACIN >=8 Resistant     NITROFURANTOIN <=16 Sensitive     TRIMETH/SULFA* <=20 Sensitive      * NR=NOT REPORTABLE,SEE COMMENTORAL therapy:A cefazolin MIC of <32 predicts susceptibility to the oral agents cefaclor,cefdinir,cefpodoxime,cefprozil,cefuroxime,cephalexin,and loracarbef when used for therapy of uncomplicated UTIs due to E.coli,K.pneumomiae,and P.mirabilis. PARENTERAL therapy: A cefazolinMIC of >8 indicates resistance to parenteralcefazolin. An alternate test method must beperformed to confirm susceptibility to parenteralcefazolin.       Studies:              All Imaging reviewed and is as per above notation   Scheduled Meds: . aspirin EC  81 mg Oral Daily  . atorvastatin  20 mg Oral Once per day on Mon Thu  . calcium carbonate  1,250 mg Oral BID WC  . cetirizine HCl  5 mg Oral BID  . folic acid  1 mg Oral Daily  . gabapentin  300 mg Oral QHS  . heparin  5,000 Units Subcutaneous 3 times per day  . montelukast  10 mg Oral QHS  . multivitamin with minerals  1 tablet Oral Daily  . piperacillin-tazobactam (ZOSYN)  IV  3.375 g Intravenous Q8H  . thiamine  100 mg Oral Daily  . triamcinolone  2 spray Nasal Daily  . valACYclovir  500 mg Oral Daily   Continuous Infusions: . sodium chloride 50 mL/hr at 07/09/15 0018     Assessment/Plan:  1. Diverticulitis-uncomplicated-continue IV zosyn.    Rest per gen surg.  Ct scan on hold given clinical improvement 2. ESBL + with asymptomatic bacteriuria-only 20,000 CFU-would not treat as pyelo-patient has no dysuria 3. Rhinorrhea-has allergies-Claritin doesn't typically help-cont zyrtec and reassess-continue Montelukast and  Monitor 4. Bipolar-continue meds 5. Neuropathy NOs-continue Gabapentin 300 qhs 6. HLD-Continue Atorvastatin 20 per schdule-LFT's are slighlty up.  Continue Statin but recheck LFT's in 2-3 mo 7. Shingles-continue Valacyclovir 500 qd   Verneita Griffes, MD  [facility] Pager [phone] 07/11/2015, 5:50 PM    LOS: 3 days

## 2015-07-11 NOTE — Progress Notes (Signed)
  Subjective: She said she was better.  Unsure of where the pain was.  On exam she still has some in the RLQ.  Much better than before.    Objective: Vital signs in last 24 hours: Temp:  [97.5 F (36.4 C)-98.3 F (36.8 C)] 97.7 F (36.5 C) (12/09 0535) Pulse Rate:  [72-87] 72 (12/09 0535) Resp:  [18-20] 18 (12/09 0535) BP: (115-147)/(57-64) 115/64 mmHg (12/09 0535) SpO2:  [97 %-98 %] 97 % (12/09 0535) Last BM Date: 07/10/15 480 PO recorded Full liquids BM x 1 Afebrile, VSS No labs today  Intake/Output from previous day: 12/08 0701 - 12/09 0700 In: 480 [P.O.:480] Out: 2150 [Urine:2150] Intake/Output this shift:    General appearance: alert, cooperative and no distress GI: soft a little tender RLQ on palpation, she has some ecchymosis from heparin injections.  Over all better.  Lab Results:   Recent Labs  07/09/15 0408 07/10/15 0350  WBC 8.0 6.3  HGB 12.8 13.1  HCT 37.3 37.6  PLT 232 236    BMET  Recent Labs  07/09/15 0408 07/10/15 0350  NA 140 140  K 3.8 3.9  CL 105 107  CO2 27 26  GLUCOSE 132* 105*  BUN 6 6  CREATININE 0.76 0.84  CALCIUM 8.9 9.3   PT/INR  Recent Labs  07/09/15 0408  LABPROT 15.3*  INR 1.19     Recent Labs Lab 07/07/15 1211 07/08/15 1356 07/09/15 0408 07/10/15 0350  AST 15 49* 41 36  ALT 21 63* 57* 56*  ALKPHOS 88 104 106 139*  BILITOT 2.6* 1.6* 1.3* 1.1  PROT 6.8 7.3 6.7 7.0  ALBUMIN 4.0 3.7 3.3* 3.3*     Lipase     Component Value Date/Time   LIPASE 11.0 07/07/2015 1211     Studies/Results: No results found.  Medications: . aspirin EC  81 mg Oral Daily  . atorvastatin  20 mg Oral Once per day on Mon Thu  . calcium carbonate  1,250 mg Oral BID WC  . cetirizine HCl  5 mg Oral BID  . folic acid  1 mg Oral Daily  . gabapentin  300 mg Oral QHS  . heparin  5,000 Units Subcutaneous 3 times per day  . montelukast  10 mg Oral QHS  . multivitamin with minerals  1 tablet Oral Daily  . piperacillin-tazobactam  (ZOSYN)  IV  3.375 g Intravenous Q8H  . thiamine  100 mg Oral Daily  . triamcinolone  2 spray Nasal Daily  . valACYclovir  500 mg Oral Daily    Assessment/Plan Sigmoid diverticulitis with microperforation Hx of IBS Hx of tobacco use Restless leg syndrome - On Neurontin  History of Herpes - on Valtrex Hx of depression Hx of Right Breast cancer with bilateral mastectomies 2005 Antibiotics: Day 4 Zosyn DVT: Heparin/SCD    Plan:  I think we can up her to a soft diet, but I will talk with Dr. Zella Richer before doing that. Continue IV antibiotics today and if better consider changing to PO's tomorrow.   LOS: 3 days    Jacinda Kanady 07/11/2015

## 2015-07-11 NOTE — Progress Notes (Signed)
Pharmacy Antibiotic Time-Out Note  Nicole Dawson is a 64 y.o. year-old female admitted on 07/08/2015.  The patient is currently on day #4 Zosyn 3.375g IV q8h (4 hour infusion time) for sigmoid diverticulitis with microperforation.  Assessment/Plan: This patient's current antibiotics will be continued without adjustments. Per surgery, continue IV abx today and will consider PO abx once pain resolved and tolerating soft diet.  Temp (24hrs), Avg:97.8 F (36.6 C), Min:97.5 F (36.4 C), Max:98.3 F (36.8 C)   Recent Labs Lab 07/07/15 1211 07/08/15 1356 07/08/15 2120 07/09/15 0408 07/10/15 0350  WBC 16.8* 11.0* 8.8 8.0 6.3    Recent Labs Lab 07/07/15 1211 07/08/15 1356 07/08/15 2120 07/09/15 0408 07/10/15 0350  CREATININE 0.93 0.91 0.82 0.76 0.84   Estimated Creatinine Clearance: 64.2 mL/min (by C-G formula based on Cr of 0.84).   Allergies  Allergen Reactions  . Hydrocod Polst-Cpm Polst Er     Patient says shes not allergic to tussionex  . Mometasone Furoate     Antimicrobials this admission: 12/6 >>Zosyn >> 12/6 >>Cipro x 1  Levels/dose changes this admission: -  Microbiology Results: 12/6 C Diff: canceled  Thank you for allowing pharmacy to be a part of this patient's care.  Hershal Coria PharmD 07/11/2015 10:19 AM

## 2015-07-11 NOTE — Discharge Instructions (Signed)
Low fiber diet for 4 weeks then high fiber diet.  Avoid red meat as much as possible.

## 2015-07-12 LAB — GLUCOSE, CAPILLARY: Glucose-Capillary: 99 mg/dL (ref 65–99)

## 2015-07-12 MED ORDER — SALINE SPRAY 0.65 % NA SOLN
1.0000 | NASAL | Status: DC | PRN
  Filled 2015-07-12: qty 44

## 2015-07-12 MED ORDER — AMOXICILLIN-POT CLAVULANATE 875-125 MG PO TABS
1.0000 | ORAL_TABLET | Freq: Two times a day (BID) | ORAL | Status: DC
  Administered 2015-07-12 – 2015-07-13 (×2): 1 via ORAL
  Filled 2015-07-12 (×2): qty 1

## 2015-07-12 NOTE — Progress Notes (Signed)
Nicole Dawson XN:[id number] DOB: 11/15/1950 DOA: 07/08/2015 PCP: Garret Reddish, MD  Brief narrative:  64 y/o ? H/o BR Ca DCIS 2004 s/p bilat Mastectomies 2004,  BTL 1984  prior appendectomy GERD, HTN Bipolar  Admitted 12.6.16 with Abd doicomfort and pain as well as CT based evidence of diverticulitis   Past medical history-As per Problem list Chart reviewed as below-   Consultants:  Gen surg  Procedures:    Antibiotics:  Zosyn 07/08/15   Subjective   Well no issues tol 3 emals  No cp Some loose stool tells me about her prior issues with incontinence   Objective    Interim History:   Telemetry:    Objective: Filed Vitals:   07/11/15 0535 07/11/15 1500 07/11/15 2103 07/12/15 0525  BP: 115/64 119/60 126/58 119/60  Pulse: 72 82 88 70  Temp: 97.7 F (36.5 C) 98.7 F (37.1 C) 98.2 F (36.8 C) 98.3 F (36.8 C)  TempSrc: Oral Oral Oral Oral  Resp: 18 20 18 18   Height:      Weight:    71.1 kg (156 lb 12 oz)  SpO2: 97% 98% 97% 97%    Intake/Output Summary (Last 24 hours) at 07/12/15 1150 Last data filed at 07/12/15 0900  Gross per 24 hour  Intake    600 ml  Output   1100 ml  Net   -500 ml    Exam:  General: eomi ncat pleasant in nad Cardiovascular: s1 s 2no m/r/g Respiratory: clear no added sound Abdomen:  Soft  nd slight tender on deep palpation   Data Reviewed: Basic Metabolic Panel:  Recent Labs Lab 07/07/15 1211 07/08/15 1356 07/08/15 2120 07/09/15 0408 07/10/15 0350  NA 141 139  --  140 140  K 4.3 4.3  --  3.8 3.9  CL 102 103  --  105 107  CO2 29 31  --  27 26  GLUCOSE 107* 151*  --  132* 105*  BUN 14 11  --  6 6  CREATININE 0.93 0.91 0.82 0.76 0.84  CALCIUM 9.6 9.4  --  8.9 9.3   Liver Function Tests:  Recent Labs Lab 07/07/15 1211 07/08/15 1356 07/09/15 0408 07/10/15 0350  AST 15 49* 41 36  ALT 21 63* 57* 56*  ALKPHOS 88 104 106 139*  BILITOT 2.6* 1.6* 1.3* 1.1  PROT 6.8 7.3 6.7 7.0  ALBUMIN 4.0 3.7  3.3* 3.3*    Recent Labs Lab 07/07/15 1211  LIPASE 11.0   No results for input(s): AMMONIA in the last 168 hours. CBC:  Recent Labs Lab 07/07/15 1211 07/08/15 1356 07/08/15 2120 07/09/15 0408 07/10/15 0350  WBC 16.8* 11.0* 8.8 8.0 6.3  NEUTROABS 14.5* 9.5*  --   --   --   HGB 14.2 13.4 13.3 12.8 13.1  HCT 41.8 39.8 36.9 37.3 37.6  MCV 95.0 94.8 92.3 93.7 93.1  PLT 242.0 231.0 PLATELET CLUMPS NOTED ON SMEAR, COUNT APPEARS DECREASED 232 236   Cardiac Enzymes: No results for input(s): CKTOTAL, CKMB, CKMBINDEX, TROPONINI in the last 168 hours. BNP: Invalid input(s): POCBNP CBG:  Recent Labs Lab 07/09/15 0811 07/11/15 0758 07/12/15 0729  GLUCAP 148* 135* 99    Recent Results (from the past 240 hour(s))  Urine culture     Status: None   Collection Time: 07/07/15  1:38 PM  Result Value Ref Range Status   Culture ESCHERICHIA COLI  Final   Colony Count 20,OOO COLONIES/ML  Final   Organism ID, Bacteria ESCHERICHIA COLI  Final    Comment: Confirmed Extended Spectrum Beta-Lactamase Producer (ESBL)       Susceptibility   Escherichia coli -  (no method available)    AMPICILLIN >=32 Resistant     AMOX/CLAVULANIC 16 Intermediate     AMPICILLIN/SULBACTAM >=32 Resistant     PIP/TAZO <=4 Sensitive     IMIPENEM <=0.25 Sensitive     CEFAZOLIN >=64 Resistant     CEFTRIAXONE >=64 Resistant     CEFTAZIDIME  Resistant     CEFEPIME  Resistant     GENTAMICIN >=16 Resistant     TOBRAMYCIN >=16 Resistant     CIPROFLOXACIN >=4 Resistant     LEVOFLOXACIN >=8 Resistant     NITROFURANTOIN <=16 Sensitive     TRIMETH/SULFA* <=20 Sensitive      * NR=NOT REPORTABLE,SEE COMMENTORAL therapy:A cefazolin MIC of <32 predicts susceptibility to the oral agents cefaclor,cefdinir,cefpodoxime,cefprozil,cefuroxime,cephalexin,and loracarbef when used for therapy of uncomplicated UTIs due to E.coli,K.pneumomiae,and P.mirabilis. PARENTERAL therapy: A cefazolinMIC of >8 indicates resistance to  parenteralcefazolin. An alternate test method must beperformed to confirm susceptibility to parenteralcefazolin.     Studies:              All Imaging reviewed and is as per above notation   Scheduled Meds: . amoxicillin-clavulanate  1 tablet Oral Q12H  . aspirin EC  81 mg Oral Daily  . atorvastatin  20 mg Oral Once per day on Mon Thu  . calcium carbonate  1,250 mg Oral BID WC  . cetirizine HCl  5 mg Oral BID  . folic acid  1 mg Oral Daily  . gabapentin  300 mg Oral QHS  . heparin  5,000 Units Subcutaneous 3 times per day  . montelukast  10 mg Oral QHS  . multivitamin with minerals  1 tablet Oral Daily  . thiamine  100 mg Oral Daily  . triamcinolone  2 spray Nasal Daily  . valACYclovir  500 mg Oral Daily   Continuous Infusions: . sodium chloride 50 mL/hr at 07/09/15 0018     Assessment/Plan:  1. Diverticulitis-uncomplicated- change IV zosyn-->Augmentin 12.10.    ? D/c am 2. ESBL + with asymptomatic bacteriuria-only 20,000 CFU-would not treat as pyelo-patient has no dysuria 3. Rhinorrhea-has allergies-Claritin doesn't typically help-cont zyrtec and reassess-continue Montelukast and  Monitor 4. Bipolar-continue meds 5. Neuropathy NOs-continue Gabapentin 300 qhs 6. HLD-Continue Atorvastatin 20 per schdule-LFT's are slighlty up.  Continue Statin but recheck LFT's in 2-3 mo 7. Shingles-continue Valacyclovir 500 qd   possible d/c home am if all stable  Verneita Griffes, MD  [facility] Pager [phone] 07/12/2015, 11:50 AM    LOS: 4 days

## 2015-07-12 NOTE — Progress Notes (Signed)
  Subjective: States pain is much better than before.   Having BM's, tolerating soft diet   Objective: Vital signs in last 24 hours: Temp:  [98.2 F (36.8 C)-98.7 F (37.1 C)] 98.3 F (36.8 C) (12/10 0525) Pulse Rate:  [70-88] 70 (12/10 0525) Resp:  [18-20] 18 (12/10 0525) BP: (119-126)/(58-60) 119/60 mmHg (12/10 0525) SpO2:  [97 %-98 %] 97 % (12/10 0525) Weight:  [71.1 kg (156 lb 12 oz)] 71.1 kg (156 lb 12 oz) (12/10 0525) Last BM Date: 07/10/15   Intake/Output from previous day: 12/09 0701 - 12/10 0700 In: 480 [P.O.:480] Out: 800 [Urine:800] Intake/Output this shift:    General appearance: alert, cooperative and no distress GI: soft a little tender RLQ on palpation, she has some ecchymosis from heparin injections.  Over all better.  Lab Results:   Recent Labs  07/10/15 0350  WBC 6.3  HGB 13.1  HCT 37.6  PLT 236    BMET  Recent Labs  07/10/15 0350  NA 140  K 3.9  CL 107  CO2 26  GLUCOSE 105*  BUN 6  CREATININE 0.84  CALCIUM 9.3   PT/INR No results for input(s): LABPROT, INR in the last 72 hours.   Recent Labs Lab 07/07/15 1211 07/08/15 1356 07/09/15 0408 07/10/15 0350  AST 15 49* 41 36  ALT 21 63* 57* 56*  ALKPHOS 88 104 106 139*  BILITOT 2.6* 1.6* 1.3* 1.1  PROT 6.8 7.3 6.7 7.0  ALBUMIN 4.0 3.7 3.3* 3.3*     Lipase     Component Value Date/Time   LIPASE 11.0 07/07/2015 1211     Studies/Results: No results found.  Medications: . aspirin EC  81 mg Oral Daily  . atorvastatin  20 mg Oral Once per day on Mon Thu  . calcium carbonate  1,250 mg Oral BID WC  . cetirizine HCl  5 mg Oral BID  . folic acid  1 mg Oral Daily  . gabapentin  300 mg Oral QHS  . heparin  5,000 Units Subcutaneous 3 times per day  . montelukast  10 mg Oral QHS  . multivitamin with minerals  1 tablet Oral Daily  . piperacillin-tazobactam (ZOSYN)  IV  3.375 g Intravenous Q8H  . thiamine  100 mg Oral Daily  . triamcinolone  2 spray Nasal Daily  . valACYclovir   500 mg Oral Daily    Assessment/Plan Sigmoid diverticulitis with microperforation Hx of IBS Hx of tobacco use Restless leg syndrome - On Neurontin  History of Herpes - on Valtrex Hx of depression Hx of Right Breast cancer with bilateral mastectomies 2005 Antibiotics: Day 5 Zosyn DVT: Heparin/SCD    Plan:  Doing well with medical management.  D/C per primary team.  F/U PRN.  Will sign off.  Please call with any questions or concerns  LOS: 4 days    Heidi Lemay C. [id number]

## 2015-07-13 ENCOUNTER — Encounter: Payer: Self-pay | Admitting: Family Medicine

## 2015-07-13 LAB — CBC WITH DIFFERENTIAL/PLATELET
BASOS PCT: 1 %
Basophils Absolute: 0.1 10*3/uL (ref 0.0–0.1)
Eosinophils Absolute: 0.2 10*3/uL (ref 0.0–0.7)
Eosinophils Relative: 2 %
HEMATOCRIT: 40.8 % (ref 36.0–46.0)
HEMOGLOBIN: 13.7 g/dL (ref 12.0–15.0)
LYMPHS PCT: 34 %
Lymphs Abs: 2.1 10*3/uL (ref 0.7–4.0)
MCH: 31.6 pg (ref 26.0–34.0)
MCHC: 33.6 g/dL (ref 30.0–36.0)
MCV: 94.2 fL (ref 78.0–100.0)
MONOS PCT: 7 %
Monocytes Absolute: 0.4 10*3/uL (ref 0.1–1.0)
NEUTROS ABS: 3.6 10*3/uL (ref 1.7–7.7)
NEUTROS PCT: 56 %
Platelets: 312 10*3/uL (ref 150–400)
RBC: 4.33 MIL/uL (ref 3.87–5.11)
RDW: 13.4 % (ref 11.5–15.5)
WBC: 6.3 10*3/uL (ref 4.0–10.5)

## 2015-07-13 LAB — GLUCOSE, CAPILLARY: Glucose-Capillary: 96 mg/dL (ref 65–99)

## 2015-07-13 MED ORDER — AMOXICILLIN-POT CLAVULANATE 875-125 MG PO TABS: 1.0000 | ORAL_TABLET | Freq: Two times a day (BID) | ORAL | Status: DC

## 2015-07-13 NOTE — Discharge Summary (Signed)
Physician Discharge Summary  Nicole Dawson [id number] DOB: July 05, 1951 DOA: 07/08/2015  PCP: Garret Reddish, MD  Admit date: 07/08/2015 Discharge date: 07/13/2015  Time spent: 35 minutes  Recommendations for Outpatient Follow-up:  1. Complete Augmentin po 07/18/15-Rx called in to pharmacy 2. Note given for excuse from work till 07/14/15  3. Needs screening colonoscopy in ~ 6-8 weeks-PCP to kindly arrange 4. rec CBC + Bmet 1-2 weeks  5. Consider LFT's as aslightly elevtaed inpatient-might affect use of statin  Discharge Diagnoses:  Principal Problem:   Sigmoid diverticulitis Active Problems:   Hyperlipidemia   BREAST CANCER, HX OF   Restless leg syndrome   Herpes simplex   IBS (irritable bowel syndrome)   Hx of tobacco use, presenting hazards to health   Discharge Condition: fair  Diet recommendation: regular-high fiber after 5-6 days of soft bland diet  Filed Weights   07/09/15 0703 07/10/15 0525 07/12/15 0525  Weight: 71.5 kg (157 lb 10.1 oz) 71.4 kg (157 lb 6.5 oz) 71.1 kg (156 lb 12 oz)    History of present illness:  64 y/o ? H/o BR Ca DCIS 2004 s/p bilat Mastectomies 2004,  BTL 1984  prior appendectomy GERD, HTN Bipolar  Admitted 12.6.16 with Abd discomfort and pain as well as CT based evidence of diverticulitis  Hospital Course:    1. Diverticulitis-uncomplicated-IV zosyn-->Augementin on 12/10  No fever no chills.Can complete OP course Abx 12/16.  OK for work on 07/15/15 2. ESBL + with asymptomatic bacteriuria-only 20,000 CFU-would not treat as pyelo-patient has no dysuria 3. Rhinorrhea-has allergies-Claritin doesn't typically help-cont zyrtec and reassess-continue Montelukast and Monitor 4. Bipolar-continue meds 5. Neuropathy NOS-continue Gabapentin 300 qhs 6. HLD-Continue Atorvastatin 20 per schdule-LFT's are slighlty up. Continue Statin but recheck LFT's in 2-3 mo 7. Shingles-continue Valacyclovir 500 qd 8. Fecal incontinence-suggest follow  up with surgeon/GI for hemorrhoids  Procedures:  CT's  Consultations:  gen surgery  Discharge Exam: Filed Vitals:   07/12/15 2145 07/13/15 0609  BP: 121/56 112/58  Pulse: 84 72  Temp: 98.3 F (36.8 C) 97.8 F (36.6 C)  Resp: 20 15   Doing well, has eaten 5-6 meals No discomfort Ambulatory No diarrhea nor n/v  General: eomi,ncat Cardiovascular: s1 s2 no m/r/g Respiratory: clear no added sound ABd soft nt nd no rebound  Discharge Instructions   Discharge Instructions    Diet - low sodium heart healthy    Complete by:  As directed      Discharge instructions    Complete by:  As directed   Take you augmentin for 5 more day ending 07/18/15     Increase activity slowly    Complete by:  As directed           Current Discharge Medication List    START taking these medications   Details  amoxicillin-clavulanate (AUGMENTIN) 875-125 MG tablet Take 1 tablet by mouth every 12 (twelve) hours. Qty: 10 tablet, Refills: 0      CONTINUE these medications which have NOT CHANGED   Details  aspirin EC 81 MG tablet Take 81 mg by mouth daily.    atorvastatin (LIPITOR) 20 MG tablet Take 1 tablet (20 mg total) by mouth 3 (three) times a week. Qty: 90 tablet, Refills: 0   Associated Diagnoses: Hyperlipidemia    Calcium Carbonate (CALTRATE 600) 1500 MG TABS Take by mouth 2 (two) times daily.      Carboxymethylcellul-Glycerin (LUBRICATING EYE DROPS OP) Apply 1 drop to eye daily as needed (dry eyes).  cetirizine (ZYRTEC) 10 MG tablet Take 10 mg by mouth daily as needed for allergies.     CHERATUSSIN AC 100-10 MG/5ML syrup TAKE ONE TEASPOONFUL BY MOUTH EVERY 8 HOURS AS NEEDED FOR COUGH Qty: 180 mL, Refills: 0    Coenzyme Q10 (CO Q-10) 100 MG CAPS Take by mouth daily.     gabapentin (NEURONTIN) 100 MG capsule TAKE ONE TO THREE CAPSULES BY MOUTH THREE TIMES DAILY Qty: 90 capsule, Refills: 3    MAGNESIUM PO Take 1 capsule by mouth daily as needed (constipation).     montelukast (SINGULAIR) 10 MG tablet Take 10 mg by mouth at bedtime.    Multiple Vitamin (MULTIVITAMIN) tablet Take 1 tablet by mouth daily.      Omega-3 1000 MG CAPS Take 1 g by mouth daily as needed (constipation).     triamcinolone (NASACORT AQ) 55 MCG/ACT AERO nasal inhaler Place 2 sprays into the nose daily. Qty: 1 Inhaler, Refills: 12    valACYclovir (VALTREX) 500 MG tablet Take 500 mg by mouth daily.       Allergies  Allergen Reactions  . Hydrocod Polst-Cpm Polst Er     Patient says shes not allergic to tussionex  . Mometasone Furoate       The results of significant diagnostics from this hospitalization (including imaging, microbiology, ancillary and laboratory) are listed below for reference.    Significant Diagnostic Studies: Ct Abdomen Pelvis W Contrast  07/08/2015  CLINICAL DATA:  Left lower and right upper quadrant abdominal pain with fever and leukocytosis. EXAM: CT ABDOMEN AND PELVIS WITH CONTRAST TECHNIQUE: Multidetector CT imaging of the abdomen and pelvis was performed using the standard protocol following bolus administration of intravenous contrast. CONTRAST:  141mL OMNIPAQUE IOHEXOL 300 MG/ML  SOLN COMPARISON:  None. FINDINGS: Lower chest and abdominal wall:  No contributory findings. Hepatobiliary: No focal liver abnormality.No evidence of biliary obstruction or stone. Pancreas: Unremarkable. Spleen: Unremarkable. Adrenals/Urinary Tract: Negative adrenals. No hydronephrosis or stone. Renal sinus cysts, better seen on the right. Unremarkable bladder. Reproductive:No pathologic findings. Stomach/Bowel: Colonic diverticulosis with active inflammation around a proximal sigmoid diverticulum. The sigmoid mesentery is diffusely edematous/inflamed, with few bubbles of extraluminal intraperitoneal gas in the pelvis. There is no abscess. Oral contrast did not reach the site of perforation, but given small volume gas contrast leakage is not expected. Appendectomy. No  obstruction. Vascular/Lymphatic: No acute vascular abnormality. No mass or adenopathy. Peritoneal: No ascites or pneumoperitoneum. Musculoskeletal: No acute abnormalities. These results were called by telephone at the time of interpretation on 07/08/2015 at 11:59 am to Dr. Garret Reddish , who verbally acknowledged these results. IMPRESSION: Sigmoid diverticulitis complicated by small pneumoperitoneum in the pelvis. Electronically Signed   By: Monte Fantasia M.D.   On: 07/08/2015 11:59    Microbiology: Recent Results (from the past 240 hour(s))  Urine culture     Status: None   Collection Time: 07/07/15  1:38 PM  Result Value Ref Range Status   Culture ESCHERICHIA COLI  Final   Colony Count 20,OOO COLONIES/ML  Final   Organism ID, Bacteria ESCHERICHIA COLI  Final    Comment: Confirmed Extended Spectrum Beta-Lactamase Producer (ESBL)       Susceptibility   Escherichia coli -  (no method available)    AMPICILLIN >=32 Resistant     AMOX/CLAVULANIC 16 Intermediate     AMPICILLIN/SULBACTAM >=32 Resistant     PIP/TAZO <=4 Sensitive     IMIPENEM <=0.25 Sensitive     CEFAZOLIN >=64 Resistant  CEFTRIAXONE >=64 Resistant     CEFTAZIDIME  Resistant     CEFEPIME  Resistant     GENTAMICIN >=16 Resistant     TOBRAMYCIN >=16 Resistant     CIPROFLOXACIN >=4 Resistant     LEVOFLOXACIN >=8 Resistant     NITROFURANTOIN <=16 Sensitive     TRIMETH/SULFA* <=20 Sensitive      * NR=NOT REPORTABLE,SEE COMMENTORAL therapy:A cefazolin MIC of <32 predicts susceptibility to the oral agents cefaclor,cefdinir,cefpodoxime,cefprozil,cefuroxime,cephalexin,and loracarbef when used for therapy of uncomplicated UTIs due to E.coli,K.pneumomiae,and P.mirabilis. PARENTERAL therapy: A cefazolinMIC of >8 indicates resistance to parenteralcefazolin. An alternate test method must beperformed to confirm susceptibility to parenteralcefazolin.     Labs: Basic Metabolic Panel:  Recent Labs Lab 07/07/15 1211 07/08/15 1356  07/08/15 2120 07/09/15 0408 07/10/15 0350  NA 141 139  --  140 140  K 4.3 4.3  --  3.8 3.9  CL 102 103  --  105 107  CO2 29 31  --  27 26  GLUCOSE 107* 151*  --  132* 105*  BUN 14 11  --  6 6  CREATININE 0.93 0.91 0.82 0.76 0.84  CALCIUM 9.6 9.4  --  8.9 9.3   Liver Function Tests:  Recent Labs Lab 07/07/15 1211 07/08/15 1356 07/09/15 0408 07/10/15 0350  AST 15 49* 41 36  ALT 21 63* 57* 56*  ALKPHOS 88 104 106 139*  BILITOT 2.6* 1.6* 1.3* 1.1  PROT 6.8 7.3 6.7 7.0  ALBUMIN 4.0 3.7 3.3* 3.3*    Recent Labs Lab 07/07/15 1211  LIPASE 11.0   No results for input(s): AMMONIA in the last 168 hours. CBC:  Recent Labs Lab 07/07/15 1211 07/08/15 1356 07/08/15 2120 07/09/15 0408 07/10/15 0350  WBC 16.8* 11.0* 8.8 8.0 6.3  NEUTROABS 14.5* 9.5*  --   --   --   HGB 14.2 13.4 13.3 12.8 13.1  HCT 41.8 39.8 36.9 37.3 37.6  MCV 95.0 94.8 92.3 93.7 93.1  PLT 242.0 231.0 PLATELET CLUMPS NOTED ON SMEAR, COUNT APPEARS DECREASED 232 236   Cardiac Enzymes: No results for input(s): CKTOTAL, CKMB, CKMBINDEX, TROPONINI in the last 168 hours. BNP: BNP (last 3 results) No results for input(s): BNP in the last 8760 hours.  ProBNP (last 3 results) No results for input(s): PROBNP in the last 8760 hours.  CBG:  Recent Labs Lab 07/09/15 0811 07/11/15 0758 07/12/15 0729  GLUCAP 148* 135* 99       Signed:  Nita Sells  [facility] 07/13/2015, 7:57 AM

## 2015-07-13 NOTE — Progress Notes (Signed)
Patient discharged to home with family, discharge instructions reviewed with patient who verbalized understanding. No new RX to patient.

## 2015-07-18 ENCOUNTER — Ambulatory Visit (INDEPENDENT_AMBULATORY_CARE_PROVIDER_SITE_OTHER): Admitting: Family Medicine

## 2015-07-18 ENCOUNTER — Encounter: Payer: Self-pay | Admitting: Family Medicine

## 2015-07-18 VITALS — BP 114/64 | HR 80 | Temp 97.7°F | Wt 160.0 lb

## 2015-07-18 DIAGNOSIS — Z1612 Extended spectrum beta lactamase (ESBL) resistance: Secondary | ICD-10-CM

## 2015-07-18 DIAGNOSIS — J069 Acute upper respiratory infection, unspecified: Secondary | ICD-10-CM | POA: Diagnosis not present

## 2015-07-18 DIAGNOSIS — A499 Bacterial infection, unspecified: Secondary | ICD-10-CM

## 2015-07-18 DIAGNOSIS — K5732 Diverticulitis of large intestine without perforation or abscess without bleeding: Secondary | ICD-10-CM | POA: Diagnosis not present

## 2015-07-18 DIAGNOSIS — N179 Acute kidney failure, unspecified: Secondary | ICD-10-CM

## 2015-07-18 LAB — COMPREHENSIVE METABOLIC PANEL
ALK PHOS: 168 U/L — AB (ref 39–117)
ALT: 36 U/L — ABNORMAL HIGH (ref 0–35)
AST: 17 U/L (ref 0–37)
Albumin: 4.1 g/dL (ref 3.5–5.2)
BUN: 14 mg/dL (ref 6–23)
CO2: 34 meq/L — AB (ref 19–32)
Calcium: 10.1 mg/dL (ref 8.4–10.5)
Chloride: 103 mEq/L (ref 96–112)
Creatinine, Ser: 0.71 mg/dL (ref 0.40–1.20)
GFR: 88.5 mL/min (ref >60.00)
GLUCOSE: 78 mg/dL (ref 70–99)
POTASSIUM: 5.2 meq/L — AB (ref 3.5–5.1)
SODIUM: 143 meq/L (ref 135–145)
TOTAL PROTEIN: 7.8 g/dL (ref 6.0–8.3)
Total Bilirubin: 0.8 mg/dL (ref 0.2–1.2)

## 2015-07-18 LAB — CBC WITH DIFFERENTIAL/PLATELET
BASOS ABS: 0 10*3/uL (ref 0.0–0.1)
Basophils Relative: 0.2 % (ref 0.0–3.0)
Eosinophils Absolute: 0 10*3/uL (ref 0.0–0.7)
Eosinophils Relative: 0.5 % (ref 0.0–5.0)
HCT: 43 % (ref 36.0–46.0)
Hemoglobin: 14.3 g/dL (ref 12.0–15.0)
LYMPHS ABS: 1.7 10*3/uL (ref 0.7–4.0)
LYMPHS PCT: 17.9 % (ref 12.0–46.0)
MCHC: 33.3 g/dL (ref 30.0–36.0)
MCV: 96.4 fl (ref 78.0–100.0)
MONO ABS: 0.5 10*3/uL (ref 0.1–1.0)
MONOS PCT: 5.1 % (ref 3.0–12.0)
NEUTROS ABS: 7.2 10*3/uL (ref 1.4–7.7)
NEUTROS PCT: 76.3 % (ref 43.0–77.0)
PLATELETS: 358 10*3/uL (ref 150.0–400.0)
RBC: 4.46 Mil/uL (ref 3.87–5.11)
RDW: 14 % (ref 11.5–15.5)
WBC: 9.5 10*3/uL (ref 4.0–10.5)

## 2015-07-18 MED ORDER — HYDROCODONE-HOMATROPINE 5-1.5 MG/5ML PO SYRP: 2.5000 mL | ORAL_SOLUTION | Freq: Three times a day (TID) | ORAL | Status: DC | PRN

## 2015-07-18 NOTE — Patient Instructions (Signed)
Labs before you leave  Letter given for work  For lingering cough from URI- hycodan given  Refer to GI for repeat colonoscopy  Try to start with more fiber, more activity, more fluids to get bowels moving  Return for new or worsening symptoms or fever

## 2015-07-18 NOTE — Progress Notes (Signed)
Garret Reddish, MD  Subjective:  Nicole Dawson is a 64 y.o. year old very pleasant female patient who presents for/with See problem oriented charting ROS- no fever, chills, nausea, vomiting. No CVA tenderness. Minimal abdominal pain as compared to previous.  Past Medical History-  Patient Active Problem List   Diagnosis Date Noted  . IBS (irritable bowel syndrome) 07/08/2015    Priority: Medium  . Restless leg syndrome 07/05/2014    Priority: Medium  . Hyperlipidemia 05/28/2009    Priority: Medium  . BREAST CANCER, HX OF 03/11/2009    Priority: Medium  . Hx of tobacco use, presenting hazards to health 07/08/2015    Priority: Low  . Wheezing 05/30/2013    Priority: Low  . Low back pain 08/03/2012    Priority: Low  . TRICUSPID REGURGITATION 05/28/2009    Priority: Low  . DEPRESSION 03/11/2009    Priority: Low  . Allergic rhinitis 03/11/2009    Priority: Low  . HEART MURMUR, HX OF 03/11/2009    Priority: Low  . History of herpes genitalis 03/11/2009    Priority: Low  . Sigmoid diverticulitis 07/08/2015    Medications- reviewed and updated Current Outpatient Prescriptions  Medication Sig Dispense Refill  . aspirin EC 81 MG tablet Take 81 mg by mouth daily.    Marland Kitchen atorvastatin (LIPITOR) 20 MG tablet Take 1 tablet (20 mg total) by mouth 3 (three) times a week. (Patient taking differently: Take 20 mg by mouth 2 (two) times a week. ) 90 tablet 0  . Calcium Carbonate (CALTRATE 600) 1500 MG TABS Take by mouth 2 (two) times daily.      . Carboxymethylcellul-Glycerin (LUBRICATING EYE DROPS OP) Apply 1 drop to eye daily as needed (dry eyes).    . cetirizine (ZYRTEC) 10 MG tablet Take 10 mg by mouth daily as needed for allergies.     . Coenzyme Q10 (CO Q-10) 100 MG CAPS Take by mouth daily.     Marland Kitchen gabapentin (NEURONTIN) 100 MG capsule TAKE ONE TO THREE CAPSULES BY MOUTH THREE TIMES DAILY (Patient taking differently: take 300 mg at bedtime) 90 capsule 3  . MAGNESIUM PO Take 1 capsule  by mouth daily as needed (constipation).    . montelukast (SINGULAIR) 10 MG tablet Take 10 mg by mouth at bedtime.    . Multiple Vitamin (MULTIVITAMIN) tablet Take 1 tablet by mouth daily.      . Omega-3 1000 MG CAPS Take 1 g by mouth daily as needed (constipation).     . triamcinolone (NASACORT AQ) 55 MCG/ACT AERO nasal inhaler Place 2 sprays into the nose daily. 1 Inhaler 12  . valACYclovir (VALTREX) 500 MG tablet Take 500 mg by mouth daily.     Objective: BP 114/64 mmHg  Pulse 80  Temp(Src) 97.7 F (36.5 C)  Wt 160 lb (72.576 kg) Gen: NAD, resting comfortably. Moves around room comfortably which she was unable to do at last visit mucous membrane moist CV: RRR no murmurs rubs or gallops Lungs: CTAB no crackles, wheeze, rhonchi Abdomen: soft/very mild abdominal pain throughout lower abdomen/nondistended/normal bowel sounds. No rebound or guarding.  Ext: no edema Skin: warm, dry Neuro: grossly normal, moves all extremities   Assessment/Plan:  Sigmoid diverticulitis S: Hospital follow-up. patient hospitalized from 07/08/15 to [id number] with complicated sigmoid diverticulitis. Complicated by small volume pneumoperitoneum. Patient was treated with IV Zosyn and then transitioned to Augmentin. She is completing a ten-day course of antibiotics today.   no fever or chills. Very mild lower abdominal pain  which occurs sometimes when she is constipated. Her bowel movements are not quite yet back to normal. But improving. A/P:Exam is drastically improved from when I saw patient last visit. Appears to have resolved diverticulitis at this point. No further imaging indicated at this point. She initially had an elevated white count. Hospitalist advise repeat which is being completed today. She also had elevated creatinine due to AKI which improved before discharge which we are repeating today as well. Also had a slight bump in LFTs in the hospital so we will repeat. Finally per hospitalist recommendations,  we will refer for repeat colonoscopy in 8 weeks. If GI does not think this is necessary they may cancel.  Of note patient also had ESBL with asymptomatic bacteriuria at 20,000 colonies. Although not requiring coverage, this was covered by the Zosyn in the hospital.   URI S: Cough and congestion about 7 days which seemed to start the hospital. Was given Cheratussin which she does not tolerate well to help her sleep. hyccodan has worked in the past A/P: Refill Hycodan. Advised patient if she has continued symptoms after about a week she is to return to care. Sooner if new or worsening symptoms  Return precautions advised.   Orders Placed This Encounter  Procedures  . CBC with Differential/Platelet  . Comprehensive metabolic panel    Brea  . Ambulatory referral to Gastroenterology    Referral Priority:  Routine    Referral Type:  Consultation    Referral Reason:  Specialty Services Required    Number of Visits Requested:  1    Meds ordered this encounter  Medications  . HYDROcodone-homatropine (HYCODAN) 5-1.5 MG/5ML syrup    Sig: Take 2.5 mLs by mouth every 8 (eight) hours as needed for cough.    Dispense:  120 mL    Refill:  0   For constipation: Try to start with more fiber, more activity, more fluids to get bowels moving. Has to be cautious with hycodan

## 2015-07-18 NOTE — Assessment & Plan Note (Signed)
S: Hospital follow-up. patient hospitalized from 07/08/15 to [id number] with complicated sigmoid diverticulitis. Complicated by small volume pneumoperitoneum. Patient was treated with IV Zosyn and then transitioned to Augmentin. She is completing a ten-day course of antibiotics today.   no fever or chills. Very mild lower abdominal pain which occurs sometimes when she is constipated. Her bowel movements are not quite yet back to normal. But improving. A/P:Exam is drastically improved from when I saw patient last visit. Appears to have resolved diverticulitis at this point. No further imaging indicated at this point. She initially had an elevated white count. Hospitalist advise repeat which is being completed today. She also had elevated creatinine due to AKI which improved before discharge which we are repeating today as well. Also had a slight bump in LFTs in the hospital so we will repeat. Finally per hospitalist recommendations, we will refer for repeat colonoscopy in 8 weeks. If GI does not think this is necessary they may cancel.  Of note patient also had ESBL with asymptomatic bacteriuria at 20,000 colonies. Although not requiring coverage, this was covered by the Zosyn in the hospital.

## 2015-08-08 ENCOUNTER — Encounter: Payer: Self-pay | Admitting: Gastroenterology

## 2015-09-30 ENCOUNTER — Other Ambulatory Visit (INDEPENDENT_AMBULATORY_CARE_PROVIDER_SITE_OTHER)

## 2015-09-30 ENCOUNTER — Ambulatory Visit (INDEPENDENT_AMBULATORY_CARE_PROVIDER_SITE_OTHER): Admitting: Gastroenterology

## 2015-09-30 ENCOUNTER — Encounter: Payer: Self-pay | Admitting: Gastroenterology

## 2015-09-30 VITALS — BP 104/64 | HR 84 | Ht 62.0 in | Wt 159.2 lb

## 2015-09-30 DIAGNOSIS — K5732 Diverticulitis of large intestine without perforation or abscess without bleeding: Secondary | ICD-10-CM | POA: Diagnosis not present

## 2015-09-30 DIAGNOSIS — K648 Other hemorrhoids: Secondary | ICD-10-CM

## 2015-09-30 DIAGNOSIS — R159 Full incontinence of feces: Secondary | ICD-10-CM | POA: Diagnosis not present

## 2015-09-30 DIAGNOSIS — R945 Abnormal results of liver function studies: Secondary | ICD-10-CM

## 2015-09-30 DIAGNOSIS — R7989 Other specified abnormal findings of blood chemistry: Secondary | ICD-10-CM

## 2015-09-30 LAB — HEPATIC FUNCTION PANEL
ALBUMIN: 4.4 g/dL (ref 3.5–5.2)
ALT: 28 U/L (ref 0–35)
AST: 24 U/L (ref 0–37)
Alkaline Phosphatase: 101 U/L (ref 39–117)
BILIRUBIN DIRECT: 0.2 mg/dL (ref 0.0–0.3)
BILIRUBIN TOTAL: 1.2 mg/dL (ref 0.2–1.2)
TOTAL PROTEIN: 7.8 g/dL (ref 6.0–8.3)

## 2015-09-30 MED ORDER — NA SULFATE-K SULFATE-MG SULF 17.5-3.13-1.6 GM/177ML PO SOLN: 1.0000 | Freq: Once | ORAL | Status: DC

## 2015-09-30 NOTE — Patient Instructions (Signed)
Your physician has requested that you go to the basement for the following lab work before leaving today:  Hepatic function  You have been scheduled for a colonoscopy. Please follow written instructions given to you at your visit today.  Please pick up your prep supplies at the pharmacy within the next 1-3 days. If you use inhalers (even only as needed), please bring them with you on the day of your procedure.

## 2015-09-30 NOTE — Progress Notes (Signed)
HPI :  65 y/o female here in consultation for bowel symptoms with history of diverticulitis and IBS   She reports having diverticulitis in December 2016, first time she has had this. She had abdominal pain / fevers which led her to be evaluated, it awoke her from sleep. She had a CT scan which showed sigmoid diverticulitis with possible microperforation. She was admitted to the hospital for about a week and on IV antibiotics. She was evaluated by the surgical service but did not require surgery, she resolved with bowel rest and observation. She has had a prior colonoscopy, she thinks in 2006, and thinks it was normal. She is not sure if she had diverticulosis on that exam. She has no FH of colon cancer, but she has a personal history of breast cancer in 2004 for which she is in remission. Her pain has since resolved following treatment with antibiotics and not bothering her any more.   She has a history of IBS which is longstanding. She thinks she is more so constipation but can have some loose stools at times and tends to have both. She denies any blood in the stools.  She uses wet-wipes to help clean herself after a bowel movement, and has some ongoing leakage of stool after a bowel movement which can discolor her underwear. This has been ongoing for a while. No perianal pain or perianal rash. She has a history of hemorrhoids and is concerned this is related to her leakage symptoms.    Of note, patient had a mild elevation in LAEs during her hospitalization which was new for her.    Past Medical History  Diagnosis Date  . Allergy   . Depression   . Heart murmur   . Breast cancer ([place])   . Genital herpes   . IBS (irritable bowel syndrome) 07/08/2015    Diagnosis by Dr. Conley Canal her prior PCP  . Sigmoid diverticulitis 07/08/2015  . Hx of tobacco use, presenting hazards to health 07/08/2015    22 years at 1 PPD  . PONV (postoperative nausea and vomiting)   . Diverticulitis   . Diverticulosis    . HLD (hyperlipidemia)      Past Surgical History  Procedure Laterality Date  . Appendectomy  1962  . Tubal ligation  1984  . Bilateral total mastectomy with axillary lymph node dissection  2005    bilateral mastectomy  . Breast reconstruction  2005   Family History  Problem Relation Age of Onset  . Hyperlipidemia Mother   . Hypertension Mother   . COPD Mother   . Stroke Mother     in 94s.   Marland Kitchen Heart attack Father     79.   . Tuberculosis Father     1/2 of lung removed.   . Hyperlipidemia Father   . Lymphoma Maternal Aunt    Social History  Substance Use Topics  . Smoking status: Former Smoker -- 1.00 packs/day for 30 years    Types: Cigarettes    Quit date: 07/07/1992  . Smokeless tobacco: Never Used  . Alcohol Use: No   Current Outpatient Prescriptions  Medication Sig Dispense Refill  . aspirin EC 81 MG tablet Take 81 mg by mouth at bedtime.     Marland Kitchen atorvastatin (LIPITOR) 20 MG tablet Take 20 mg by mouth 2 (two) times a week.    . Calcium Carbonate (CALTRATE 600) 1500 MG TABS Take by mouth 2 (two) times daily.      . Carboxymethylcellul-Glycerin (LUBRICATING  EYE DROPS OP) Apply 1 drop to eye as needed (dry eyes).     . cetirizine (ZYRTEC) 10 MG tablet Take 10 mg by mouth daily as needed for allergies.     . Coenzyme Q10 (CO Q-10) 100 MG CAPS Take by mouth daily.     Marland Kitchen gabapentin (NEURONTIN) 100 MG capsule Take 300 mg by mouth 3 (three) times daily.    Marland Kitchen HYDROcodone-homatropine (HYCODAN) 5-1.5 MG/5ML syrup Take 2.5 mLs by mouth every 8 (eight) hours as needed for cough. 120 mL 0  . MAGNESIUM PO Take 1 capsule by mouth daily as needed (constipation).    . montelukast (SINGULAIR) 10 MG tablet Take 10 mg by mouth at bedtime.    . Multiple Vitamin (MULTIVITAMIN) tablet Take 1 tablet by mouth daily.      . Omega-3 1000 MG CAPS Take 1 g by mouth daily as needed (constipation).     . triamcinolone (NASACORT AQ) 55 MCG/ACT AERO nasal inhaler Place 2 sprays into the nose daily.  1 Inhaler 12  . valACYclovir (VALTREX) 500 MG tablet Take 500 mg by mouth at bedtime.      No current facility-administered medications for this visit.   No Known Allergies   Review of Systems: All systems reviewed and negative except where noted in HPI.   Lab Results  Component Value Date   WBC 9.5 07/18/2015   HGB 14.3 07/18/2015   HCT 43.0 07/18/2015   MCV 96.4 07/18/2015   PLT 358.0 07/18/2015    Lab Results  Component Value Date   CREATININE 0.71 07/18/2015   BUN 14 07/18/2015   NA 143 07/18/2015   K 5.2* 07/18/2015   CL 103 07/18/2015   CO2 34* 07/18/2015    Lab Results  Component Value Date   ALT 36* 07/18/2015   AST 17 07/18/2015   ALKPHOS 168* 07/18/2015   BILITOT 0.8 07/18/2015     Physical Exam: BP 104/64 mmHg  Pulse 84  Ht 5\' 2"  (1.575 m)  Wt 159 lb 4 oz (72.235 kg)  BMI 29.12 kg/m2 Constitutional: Pleasant,well-developed, female in no acute distress. HEENT: Normocephalic and atraumatic. Conjunctivae are normal. No scleral icterus. Neck supple.  Cardiovascular: Normal rate, regular rhythm.  Pulmonary/chest: Effort normal and breath sounds normal. No wheezing, rales or rhonchi. Abdominal: Soft, nondistended, nontender. Bowel sounds active throughout. There are no masses palpable. No hepatomegaly. Extremities: no edema Lymphadenopathy: No cervical adenopathy noted. Neurological: Alert and oriented to person place and time. Skin: Skin is warm and dry. No rashes noted. Psychiatric: Normal mood and affect. Behavior is normal.   ASSESSMENT AND PLAN: 65 y/o female here for evaluation for recent diagnosis of left sided diverticulitis with microperforation leading to hospitalization, but did not require surgery. She has recovered from this and has had no further symptoms. Her last colonoscopy was around 10 years ago. Recommend colonoscopy at this time, more than 6 weeks out from her hospitalization, for screening purposes and to ensure confirm the  diagnosis of diverticulosis of the left colon and ensure no polyp / mass lesion. The indications, risks, and benefits of colonoscopy were explained to the patient in detail. Risks include but are not limited to bleeding, perforation, adverse reaction to medications, and cardiopulmonary compromise. Sequelae include but are not limited to the possibility of surgery, hospitalization, and mortality. The patient verbalized understanding and wished to proceed. All questions answered, referred to the scheduler and bowel prep ordered. Further recommendations pending results of the exam.   Patient also with  history of IBS which appears stable at this time but main concern is fecal leakage / soiling after BMs. She has a history of hemorrhoids and think she may be a good candidate for hemorrhoid banding which can help symptoms of fecal leakage. I will await the result of her colonsocopy first to ensure no other process which could be contributing, and if only hemorrhoids are noted, we will proceed with banding. She agreed.   Elevated AP with mild ALT elevation during hospitalization - suspect likely drug effect from antibiotics. I will recheck LFTs at this time. If the elevation persists we will need to send additional serologies to further evaluate it. She agreed.   [place] Cellar, MD [facility] Pager [phone]

## 2015-10-22 ENCOUNTER — Encounter: Admitting: Gastroenterology

## 2015-10-27 ENCOUNTER — Telehealth: Payer: Self-pay | Admitting: Gastroenterology

## 2015-10-27 NOTE — Telephone Encounter (Signed)
Pt came into the office and resch'd her colon from 10-29-15 to 11-26-15.  Would you like pt charged the cancelation fee?

## 2015-10-27 NOTE — Telephone Encounter (Signed)
No that's okay. Thanks 

## 2015-10-27 NOTE — Telephone Encounter (Signed)
She does not have to cancel. Just do not eat any more nuts.

## 2015-10-29 ENCOUNTER — Encounter: Admitting: Gastroenterology

## 2015-10-29 ENCOUNTER — Telehealth: Payer: Self-pay | Admitting: Family Medicine

## 2015-10-29 MED ORDER — ONDANSETRON 4 MG PO TBDP: 4.0000 mg | ORAL_TABLET | Freq: Three times a day (TID) | ORAL | Status: DC | PRN

## 2015-10-29 NOTE — Telephone Encounter (Signed)
Dr. Yong Channel has spoke to the pt.  I will now close the note.

## 2015-10-29 NOTE — Telephone Encounter (Signed)
Patient Name: Nicole Dawson  DOB: 02-May-1951    Initial Comment caller states she has vomiting and diarrhea; has urinated    Nurse Assessment  Nurse: Raphael Gibney, RN, Vanita Ingles Date/Time (Eastern Time): 10/29/2015 9:49:26 AM  Confirm and document reason for call. If symptomatic, describe symptoms. You must click the next button to save text entered. ---Caller states she has vomiting and diarrhea. Vomiting and diarrhea started this am. She has urinated this am. Vomited x 4. Has abd cramping. Diarrhea x 3.  Has the patient traveled out of the country within the last 30 days? ---No  Does the patient have any new or worsening symptoms? ---Yes  Will a triage be completed? ---Yes  Related visit to physician within the last 2 weeks? ---No  Does the PT have any chronic conditions? (i.e. diabetes, asthma, etc.) ---Yes  List chronic conditions. ---diverticulitis in Dec  Is this a behavioral health or substance abuse call? ---No     Guidelines    Guideline Title Affirmed Question Affirmed Notes  Vomiting [1] MODERATE vomiting (e.g., 3 - 5 times/day) AND [2] age > 73    Final Disposition User   Go to ED Now (or PCP triage) Raphael Gibney, RN, Vera    Comments  No appts available at [facility] with 4 hrs. Pt does not want to go to ER, urgent care, or another [place] office. Called back line and spoke to [place] and advised her that pt has vomiting and diarrhea with outcome of go to ER (or PCP triage) and she does not want to go to ER, or urgent care or another office. Please call pt back.   Referrals  GO TO FACILITY REFUSED   Disagree/Comply: Disagree  Disagree/Comply Reason: Disagree with instructions

## 2015-10-29 NOTE — Telephone Encounter (Signed)
See below

## 2015-10-29 NOTE — Telephone Encounter (Signed)
Patient with diarrhea and vomiting starting today. Has been exposed to GI illness. No fever. Sent in zofran to help patient tolerate fluids- needs to go to ER or urgent care if signs of dehydration which we discussed. Discussed 3-8 days of symptoms likely though have seen some 24 hours bugs- would stay out of work until no vomiting or diarrhea for at least 24 hours. Strict return precautions given

## 2015-11-03 ENCOUNTER — Telehealth: Payer: Self-pay | Admitting: Family Medicine

## 2015-11-03 NOTE — Telephone Encounter (Signed)
LM on pt vm informing her that her letter is upfront available for pick up.

## 2015-11-03 NOTE — Telephone Encounter (Signed)
Pt call to ask for a note for work for her absents. She was out of work from 10/29/15 till 11/02/15

## 2015-11-03 NOTE — Telephone Encounter (Signed)
Ok to write note

## 2015-11-03 NOTE — Telephone Encounter (Signed)
Yes thanks 

## 2015-11-26 ENCOUNTER — Ambulatory Visit (AMBULATORY_SURGERY_CENTER): Admitting: Gastroenterology

## 2015-11-26 ENCOUNTER — Encounter: Payer: Self-pay | Admitting: Gastroenterology

## 2015-11-26 VITALS — BP 116/68 | HR 69 | Temp 98.6°F | Resp 14 | Ht 62.0 in | Wt 159.0 lb

## 2015-11-26 DIAGNOSIS — Z1211 Encounter for screening for malignant neoplasm of colon: Secondary | ICD-10-CM

## 2015-11-26 DIAGNOSIS — K5732 Diverticulitis of large intestine without perforation or abscess without bleeding: Secondary | ICD-10-CM | POA: Diagnosis not present

## 2015-11-26 DIAGNOSIS — D123 Benign neoplasm of transverse colon: Secondary | ICD-10-CM

## 2015-11-26 MED ORDER — SODIUM CHLORIDE 0.9 % IV SOLN: 500.0000 mL | INTRAVENOUS | Status: DC

## 2015-11-26 NOTE — Op Note (Signed)
[place] Patient Name: Nicole Dawson Procedure Date: 11/26/2015 2:44 PM MRN: [id number] Endoscopist: Remo Lipps P. Havery Moros , MD Age: 65 Date of Birth: 12-18-1950 Gender: Female Procedure:                Colonoscopy Indications:              Screening for malignant neoplasm in the colon,                            history of diverticulitis of the sigmoid colon with                            microperforation Medicines:                Monitored Anesthesia Care Procedure:                Pre-Anesthesia Assessment:                           - Prior to the procedure, a History and Physical                            was performed, and patient medications and                            allergies were reviewed. The patient's tolerance of                            previous anesthesia was also reviewed. The risks                            and benefits of the procedure and the sedation                            options and risks were discussed with the patient.                            All questions were answered, and informed consent                            was obtained. Prior Anticoagulants: The patient has                            taken aspirin, last dose was 1 day prior to                            procedure. ASA Grade Assessment: III - A patient                            with severe systemic disease. After reviewing the                            risks and benefits, the patient was deemed in  satisfactory condition to undergo the procedure.                           After obtaining informed consent, the colonoscope                            was passed under direct vision. Throughout the                            procedure, the patient's blood pressure, pulse, and                            oxygen saturations were monitored continuously. The                            Model PCF-H190L [id number]) scope was introduced                             through the anus and advanced to the the cecum,                            identified by appendiceal orifice and ileocecal                            valve. The colonoscopy was technically difficult                            and complex due to restricted mobility of the                            colon. The patient tolerated the procedure well.                            The quality of the bowel preparation was adequate.                            The ileocecal valve, appendiceal orifice, and                            rectum were photographed. Scope In: 2:50:18 PM Scope Out: 3:18:08 PM Scope Withdrawal Time: 0 hours 17 minutes 18 seconds  Total Procedure Duration: 0 hours 27 minutes 50 seconds  Findings:                 The perianal and digital rectal examinations were                            normal.                           Many small and large-mouthed diverticula were found                            in the entire colon. The sigmoid colon was quite  angulated with some restricted mobility secondary                            to diverticulosis, but was able to be traversed                            with slow insertion of the endoscope.                           A 5 mm polyp was found in the transverse colon. The                            polyp was flat. The polyp was removed with a cold                            snare. Resection and retrieval were complete.                           Non-bleeding internal hemorrhoids were found during                            endoscopy. The hemorrhoids were small.                           The exam was otherwise without abnormality.                            Retroflexion was not performed due to small rectum                            size. Complications:            No immediate complications. Estimated blood loss:                            Minimal. Estimated Blood Loss:     Estimated blood loss was  minimal. Impression:               - Diverticulosis in the entire examined colon.                           - One 5 mm polyp in the transverse colon, removed                            with a cold snare. Resected and retrieved.                           - Non-bleeding internal hemorrhoids.                           - The examination was otherwise normal. Recommendation:           - Patient has a contact number available for                            emergencies.  The signs and symptoms of potential                            delayed complications were discussed with the                            patient. Return to normal activities tomorrow.                            Written discharge instructions were provided to the                            patient.                           - Resume previous diet.                           - Continue present medications.                           - No aspirin, ibuprofen, naproxen, or other                            non-steroidal anti-inflammatory drugs for 2 weeks                            after polyp removal.                           - Await pathology results.                           - Repeat colonoscopy is recommended for                            surveillance. The colonoscopy date will be                            determined after pathology results from today's                            exam become available for review. Remo Lipps P. Shawnese Magner, MD 11/26/2015 3:23:36 PM This report has been signed electronically.

## 2015-11-26 NOTE — Progress Notes (Signed)
Called to room to assist during endoscopic procedure.  Patient ID and intended procedure confirmed with present staff. Received instructions for my participation in the procedure from the performing physician.  

## 2015-11-26 NOTE — Patient Instructions (Signed)
Colon polyps removed today. Handouts given on polyps, diverticulosis, high fiber diet, and hemorrhoids with banding. Result letter in your mail in 2-3 weeks.  No aspirin, ibuprofen, naproxen,Advil, or any other non-steroidal anti-inflammatory medications for 2 weeks (may resume on May 11,2017). Call us with any questions or concerns. Thank you!   YOU HAD AN ENDOSCOPIC PROCEDURE TODAY AT [facility]:   Refer to the procedure report that was given to you for any specific questions about what was found during the examination.  If the procedure report does not answer your questions, please call your gastroenterologist to clarify.  If you requested that your care partner not be given the details of your procedure findings, then the procedure report has been included in a sealed envelope for you to review at your convenience later.  YOU SHOULD EXPECT: Some feelings of bloating in the abdomen. Passage of more gas than usual.  Walking can help get rid of the air that was put into your GI tract during the procedure and reduce the bloating. If you had a lower endoscopy (such as a colonoscopy or flexible sigmoidoscopy) you may notice spotting of blood in your stool or on the toilet paper. If you underwent a bowel prep for your procedure, you may not have a normal bowel movement for a few days.  Please Note:  You might notice some irritation and congestion in your nose or some drainage.  This is from the oxygen used during your procedure.  There is no need for concern and it should clear up in a day or so.  SYMPTOMS TO REPORT IMMEDIATELY:   Following lower endoscopy (colonoscopy or flexible sigmoidoscopy):  Excessive amounts of blood in the stool  Significant tenderness or worsening of abdominal pains  Swelling of the abdomen that is new, acute  Fever of 100F or higher   For urgent or emergent issues, a gastroenterologist can be reached at any hour by calling [phone].   DIET:  Your first meal following the procedure should be a small meal and then it is ok to progress to your normal diet. Heavy or fried foods are harder to digest and may make you feel nauseous or bloated.  Likewise, meals heavy in dairy and vegetables can increase bloating.  Drink plenty of fluids but you should avoid alcoholic beverages for 24 hours.  ACTIVITY:  You should plan to take it easy for the rest of today and you should NOT DRIVE or use heavy machinery until tomorrow (because of the sedation medicines used during the test).    FOLLOW UP: Our staff will call the number listed on your records the next business day following your procedure to check on you and address any questions or concerns that you may have regarding the information given to you following your procedure. If we do not reach you, we will leave a message.  However, if you are feeling well and you are not experiencing any problems, there is no need to return our call.  We will assume that you have returned to your regular daily activities without incident.  If any biopsies were taken you will be contacted by phone or by letter within the next 1-3 weeks.  Please call us at [phone] if you have not heard about the biopsies in 3 weeks.    SIGNATURES/CONFIDENTIALITY: You and/or your care partner have signed paperwork which will be entered into your electronic medical record.  These signatures attest to the fact that that the  information above on your After Visit Summary has been reviewed and is understood.  Full responsibility of the confidentiality of this discharge information lies with you and/or your care-partner.

## 2015-11-26 NOTE — Progress Notes (Signed)
To pacu vss patent aw reprot to rn 

## 2015-11-27 ENCOUNTER — Telehealth: Payer: Self-pay

## 2015-11-27 NOTE — Telephone Encounter (Signed)
  Follow up Call-  Call back number 11/26/2015  Post procedure Call Back phone  # [phone]  Permission to leave phone message Yes     Patient questions:  Do you have a fever, pain , or abdominal swelling? No. Pain Score  0 *  Have you tolerated food without any problems? Yes.    Have you been able to return to your normal activities? Yes.    Do you have any questions about your discharge instructions: Diet   No. Medications  No. Follow up visit  No.  Do you have questions or concerns about your Care? No.  Actions: * If pain score is 4 or above: No action needed, pain <4.

## 2015-12-01 ENCOUNTER — Telehealth: Payer: Self-pay | Admitting: *Deleted

## 2015-12-01 NOTE — Telephone Encounter (Signed)
[facility] [address], [place] Fax [phone] Rx request atorvastatin (LIPITOR) 20 MG tablet Looks like this was prescribed by GI?

## 2015-12-01 NOTE — Telephone Encounter (Signed)
Yes thanks, may refill- should be through primary care

## 2015-12-01 NOTE — Telephone Encounter (Signed)
See Below:

## 2015-12-02 MED ORDER — ATORVASTATIN CALCIUM 20 MG PO TABS: 20.0000 mg | ORAL_TABLET | ORAL | Status: DC

## 2015-12-02 NOTE — Telephone Encounter (Signed)
Medication refilled

## 2015-12-08 ENCOUNTER — Encounter: Payer: Self-pay | Admitting: Gastroenterology

## 2015-12-16 ENCOUNTER — Other Ambulatory Visit: Payer: Self-pay | Admitting: Family Medicine

## 2016-02-11 ENCOUNTER — Other Ambulatory Visit: Payer: Self-pay | Admitting: Family Medicine

## 2016-03-14 ENCOUNTER — Other Ambulatory Visit: Payer: Self-pay | Admitting: Family Medicine

## 2016-03-22 ENCOUNTER — Ambulatory Visit (INDEPENDENT_AMBULATORY_CARE_PROVIDER_SITE_OTHER)
Admission: RE | Admit: 2016-03-22 | Discharge: 2016-03-22 | Disposition: A | Discharge status: Home / Self Care | Source: Ambulatory Visit | Attending: Family Medicine | Admitting: Family Medicine

## 2016-03-22 ENCOUNTER — Encounter: Payer: Self-pay | Admitting: Family Medicine

## 2016-03-22 ENCOUNTER — Ambulatory Visit (INDEPENDENT_AMBULATORY_CARE_PROVIDER_SITE_OTHER): Admitting: Family Medicine

## 2016-03-22 VITALS — BP 118/68 | HR 79 | Temp 97.8°F | Wt 158.0 lb

## 2016-03-22 DIAGNOSIS — M25552 Pain in left hip: Secondary | ICD-10-CM | POA: Diagnosis not present

## 2016-03-22 MED ORDER — MELOXICAM 7.5 MG PO TABS: 7.5000 mg | ORAL_TABLET | Freq: Every day | ORAL | 0 refills | Status: DC

## 2016-03-22 NOTE — Progress Notes (Signed)
Pre visit review using our clinic review tool, if applicable. No additional management support is needed unless otherwise documented below in the visit note. 

## 2016-03-22 NOTE — Patient Instructions (Signed)
Get x-ray at [facility] get results back before starting meloxicam. Gave you 14 but start with 10 then use the others as needed. Update me in about 2 weeks.   May need to consider sports medicine or ortho input if not improving

## 2016-03-22 NOTE — Progress Notes (Signed)
Subjective:  Nicole Dawson is a 65 y.o. year old very pleasant female patient who presents for/with See problem oriented charting ROS- see any ROS included in HPI as well.   Past Medical History-  Patient Active Problem List   Diagnosis Date Noted  . IBS (irritable bowel syndrome) 07/08/2015    Priority: Medium  . Restless leg syndrome 07/05/2014    Priority: Medium  . Hyperlipidemia 05/28/2009    Priority: Medium  . BREAST CANCER, HX OF 03/11/2009    Priority: Medium  . Hx of tobacco use, presenting hazards to health 07/08/2015    Priority: Low  . Wheezing 05/30/2013    Priority: Low  . Low back pain 08/03/2012    Priority: Low  . TRICUSPID REGURGITATION 05/28/2009    Priority: Low  . DEPRESSION 03/11/2009    Priority: Low  . Allergic rhinitis 03/11/2009    Priority: Low  . HEART MURMUR, HX OF 03/11/2009    Priority: Low  . History of herpes genitalis 03/11/2009    Priority: Low  . Sigmoid diverticulitis 07/08/2015    Medications- reviewed and updated Current Outpatient Prescriptions  Medication Sig Dispense Refill  . aspirin EC 81 MG tablet Take 81 mg by mouth at bedtime.     Marland Kitchen atorvastatin (LIPITOR) 20 MG tablet Take 1 tablet (20 mg total) by mouth 2 (two) times a week. 45 tablet 2  . Calcium Carbonate (CALTRATE 600) 1500 MG TABS Take by mouth 2 (two) times daily.      . Carboxymethylcellul-Glycerin (LUBRICATING EYE DROPS OP) Apply 1 drop to eye as needed (dry eyes).     . cetirizine (ZYRTEC) 10 MG tablet Take 10 mg by mouth daily as needed for allergies.     . Coenzyme Q10 (CO Q-10) 100 MG CAPS Take by mouth daily.     Marland Kitchen gabapentin (NEURONTIN) 100 MG capsule Take 300 mg by mouth 3 (three) times daily.    Marland Kitchen gabapentin (NEURONTIN) 100 MG capsule TAKE ONE TO THREE CAPSULES BY MOUTH THREE TIMES DAILY 90 capsule 1  . HYDROcodone-homatropine (HYCODAN) 5-1.5 MG/5ML syrup Take 2.5 mLs by mouth every 8 (eight) hours as needed for cough. 120 mL 0  . MAGNESIUM PO Take 1  capsule by mouth daily as needed (constipation).    . montelukast (SINGULAIR) 10 MG tablet Take 10 mg by mouth at bedtime.    . Multiple Vitamin (MULTIVITAMIN) tablet Take 1 tablet by mouth daily.      . Omega-3 1000 MG CAPS Take 1 g by mouth daily as needed (constipation).     . ondansetron (ZOFRAN-ODT) 4 MG disintegrating tablet Take 1 tablet (4 mg total) by mouth every 8 (eight) hours as needed for nausea or vomiting. 20 tablet 0  . triamcinolone (NASACORT AQ) 55 MCG/ACT AERO nasal inhaler Place 2 sprays into the nose daily. 1 Inhaler 12  . valACYclovir (VALTREX) 500 MG tablet Take 500 mg by mouth at bedtime.      No current facility-administered medications for this visit.     Objective: BP 118/68 (BP Location: Left Arm, Patient Position: Sitting, Cuff Size: Normal)   Pulse 79   Temp 97.8 F (36.6 C) (Oral)   Wt 158 lb (71.7 kg)   SpO2 94%   BMI 28.90 kg/m  Gen: NAD, resting comfortably on table. Some pain with getting onto table.  Ext: no edema Skin: warm, dry, no rash Neuro: grossly normal, moves all extremities  Hip: ROM IR: 80 Deg, ER: 80 Deg, Flexion: 120 Deg,  Extension: 100 Deg, Abduction: 45 Deg, Adduction: 45 Deg Strength IR: 5/5, ER: 5/5, Flexion: 5/5, Extension: 5/5, Abduction: 5/5, Adduction: 5/5 Pelvic alignment unremarkable to inspection and palpation. Greater trochanter without tenderness to palpation. No SI joint tenderness and normal minimal SI movement.  Patient with pain on ER of hip and with FABER (but only in groin)  No pain on back exam.   Assessment/Plan:  Left hip pain - Plan: DG HIP UNILAT WITH PELVIS 2-3 VIEWS LEFT S: pain starts in left groin. Worse when she lifts the left leg up to go up stairs. Sometimes feels a toothache pain into her upper leg, does not pass the knee. Pain level in groin 6/10- worsens up stairs to 8/10. Has not tried any medication yet. Going on for about 1 month. Not getting worse. Does not remember any injury. No lift  facility working at [employer]. Hurts worse to lay down on either side, better on her back. Does not wake her from sleep.  ROS- no numbness or tingling in leg. No fecal or urinary incontinence. No leg weakness A/P: 65 year old female with left groin pain concerning for OA of hip. Will get films. After films- likely 10-14 days of mobic. May need to consider sports medicine or ortho input if not improving. Could also consider PT.    Extended counseling on benefits/risks of nsaids and prednisone  14 day update by phone or mychart or in person  Orders Placed This Encounter  Procedures  . DG HIP UNILAT WITH PELVIS 2-3 VIEWS LEFT    Standing Status:   Future    Standing Expiration Date:   05/22/2017    Order Specific Question:   Reason for Exam (SYMPTOM  OR DIAGNOSIS REQUIRED)    Answer:   left groin pain. concern arthritis.    Order Specific Question:   Preferred imaging location?    Answer:   [facility]    Meds ordered this encounter  Medications  . meloxicam (MOBIC) 7.5 MG tablet    Sig: Take 1 tablet (7.5 mg total) by mouth daily.    Dispense:  14 tablet    Refill:  0   The duration of face-to-face time during this visit was 25 minutes. Greater than 50% of this time was spent in counseling, explanation of diagnosis, planning of further management, and/or coordination of care.    Return precautions advised.  Garret Reddish, MD

## 2016-03-24 ENCOUNTER — Telehealth: Payer: Self-pay | Admitting: Family Medicine

## 2016-03-24 NOTE — Telephone Encounter (Signed)
Patient Name: AVILYN MOAD DOB: 06-15-1951 Initial Comment Caller states she was prescribed an antiflammatory and she is having pain in her groin and left hip area. Nurse Assessment Guidelines Guideline Title Affirmed Question Affirmed Notes Final Disposition User FINAL ATTEMPT MADE - no message left [place], Therapist, sports, Kermit Balo

## 2016-03-25 NOTE — Telephone Encounter (Signed)
This appears incomplete- can you provide further information? Unclear what is needed here

## 2016-03-25 NOTE — Telephone Encounter (Signed)
Yes thanks, that's fine

## 2016-03-25 NOTE — Telephone Encounter (Signed)
Left message for pt to call back to get more information

## 2016-03-25 NOTE — Telephone Encounter (Signed)
Pt called back, would like to know if its safe for her to take her bayer aspirin with her Meloxicam? Please advise. Thanks.

## 2016-03-26 NOTE — Telephone Encounter (Signed)
Called and tried to notify patient but her mailbox is full and I am unable to leave a message.

## 2016-03-30 NOTE — Telephone Encounter (Signed)
Spoke with patient who verbalized understanding and stated she would take the ASA also.

## 2016-04-14 ENCOUNTER — Ambulatory Visit (INDEPENDENT_AMBULATORY_CARE_PROVIDER_SITE_OTHER): Admitting: Family Medicine

## 2016-04-14 ENCOUNTER — Encounter: Payer: Self-pay | Admitting: Family Medicine

## 2016-04-14 VITALS — BP 114/62 | HR 81 | Temp 97.7°F | Wt 161.2 lb

## 2016-04-14 DIAGNOSIS — M25552 Pain in left hip: Secondary | ICD-10-CM | POA: Diagnosis not present

## 2016-04-14 NOTE — Patient Instructions (Signed)
We will call you within a week about your referral to sports medicine. If you do not hear within 2 weeks, give [place] a call.

## 2016-04-14 NOTE — Progress Notes (Signed)
Subjective:  Nicole Dawson is a 65 y.o. year old very pleasant female patient who presents for/with See problem oriented charting ROS- see ROS included in HPI as well.   Past Medical History-  Patient Active Problem List   Diagnosis Date Noted  . IBS (irritable bowel syndrome) 07/08/2015    Priority: Medium  . Restless leg syndrome 07/05/2014    Priority: Medium  . Hyperlipidemia 05/28/2009    Priority: Medium  . BREAST CANCER, HX OF 03/11/2009    Priority: Medium  . Hx of tobacco use, presenting hazards to health 07/08/2015    Priority: Low  . Wheezing 05/30/2013    Priority: Low  . Low back pain 08/03/2012    Priority: Low  . TRICUSPID REGURGITATION 05/28/2009    Priority: Low  . DEPRESSION 03/11/2009    Priority: Low  . Allergic rhinitis 03/11/2009    Priority: Low  . HEART MURMUR, HX OF 03/11/2009    Priority: Low  . History of herpes genitalis 03/11/2009    Priority: Low  . Sigmoid diverticulitis 07/08/2015    Medications- reviewed and updated Current Outpatient Prescriptions  Medication Sig Dispense Refill  . aspirin EC 81 MG tablet Take 81 mg by mouth at bedtime.     Marland Kitchen atorvastatin (LIPITOR) 20 MG tablet Take 1 tablet (20 mg total) by mouth 2 (two) times a week. 45 tablet 2  . Calcium Carbonate (CALTRATE 600) 1500 MG TABS Take by mouth 2 (two) times daily.      . Carboxymethylcellul-Glycerin (LUBRICATING EYE DROPS OP) Apply 1 drop to eye as needed (dry eyes).     . cetirizine (ZYRTEC) 10 MG tablet Take 10 mg by mouth daily as needed for allergies.     . Coenzyme Q10 (CO Q-10) 100 MG CAPS Take by mouth daily.     Marland Kitchen gabapentin (NEURONTIN) 100 MG capsule Take 300 mg by mouth 3 (three) times daily.    Marland Kitchen gabapentin (NEURONTIN) 100 MG capsule TAKE ONE TO THREE CAPSULES BY MOUTH THREE TIMES DAILY 90 capsule 1  . HYDROcodone-homatropine (HYCODAN) 5-1.5 MG/5ML syrup Take 2.5 mLs by mouth every 8 (eight) hours as needed for cough. 120 mL 0  . MAGNESIUM PO Take 1 capsule  by mouth daily as needed (constipation).    . montelukast (SINGULAIR) 10 MG tablet Take 10 mg by mouth at bedtime.    . Multiple Vitamin (MULTIVITAMIN) tablet Take 1 tablet by mouth daily.      . Omega-3 1000 MG CAPS Take 1 g by mouth daily as needed (constipation).     . ondansetron (ZOFRAN-ODT) 4 MG disintegrating tablet Take 1 tablet (4 mg total) by mouth every 8 (eight) hours as needed for nausea or vomiting. 20 tablet 0  . triamcinolone (NASACORT AQ) 55 MCG/ACT AERO nasal inhaler Place 2 sprays into the nose daily. 1 Inhaler 12  . valACYclovir (VALTREX) 500 MG tablet Take 500 mg by mouth at bedtime.     Finished 2 week mobic course  Objective: BP 114/62 (BP Location: Left Arm, Patient Position: Sitting, Cuff Size: Normal)   Pulse 81   Temp 97.7 F (36.5 C) (Oral)   Wt 161 lb 3.2 oz (73.1 kg)   SpO2 96%   BMI 29.48 kg/m  Gen: NAD, resting comfortably on table. Last visit appeared to have some discomfort getting onto table- none at this visit.   Ext: no edema Skin: warm, dry, no rash Neuro: grossly normal, moves all extremities  Left and Right Hip: ROM remains normal  Strength IR: 5/5, ER: 5/5, Flexion: 5/5, Extension: 5/5, Abduction: 5/5, Adduction: 5/5 Pelvic alignment unremarkable to inspection and palpation. Equal leg length Greater trochanter without tenderness to palpation. No SI joint tenderness and normal minimal SI movement.  Patient with pain on ER of hip and with FABER. She also has mild tenderness in left groin with deep palpation- normal pulses in area  No pain on back exam once again  Assessment/Plan:  Left hip pain - Plan: Ambulatory referral to Sports Medicine S: Patient seen 2-3 weeks ago noted with left hip pain going into the left upper leg and sometimes to the knee going upstairs with tootache type pain from 6/10 up to 8/10 when going up stairs. X-rays did not show arthritis. SYmptoms now 6 weeks- this is despite a 10 day course of meloxicam which perhaps  gave 0-5% improvement. Once again denies injury history. Still hurts to lay on her side at night but she is finally sleeping ROS- no numbness or tingling in leg. No fecal or urinary incontinence. No leg weakness A/P: 65 year old female with left groin pain concerning for OA of hip but with films that were negative. Still suspect MSK cause but cannot pinpoint on exam. Considered PT but plan for therapy would not be clear. We had a discussion on consideration of course of prednisone (in case there was a back etiology) or sports med referral- she ultimately opted for sports medicine visit which was ordered  Orders Placed This Encounter  Procedures  . Ambulatory referral to Sports Medicine    Referral Priority:   Routine    Referral Type:   Consultation    Referred to Provider:   Hulan Saas, MD    Number of Visits Requested:   1   The duration of face-to-face time during this visit was 15 minutes. Greater than 50% of this time was spent in counseling about treatment options and fact that diagnosis is uncertain. In addition she asked about a refill of prior hydrocodone cough syrup from last year- discussed would maximize her allergy regimen- pretty reasonable right now and try dextromethorphan but discussed nature/risks of repeat opiates without severe symptoms in particular.    Return precautions advised.  Garret Reddish, MD

## 2016-04-14 NOTE — Progress Notes (Signed)
Pre visit review using our clinic review tool, if applicable. No additional management support is needed unless otherwise documented below in the visit note. 

## 2016-04-29 ENCOUNTER — Ambulatory Visit: Admitting: Family Medicine

## 2016-05-07 NOTE — Progress Notes (Signed)
[facility] [place],  [address] Phone: [phone] Subjective:    I'm seeing this patient by the request  of:  Garret Reddish, MD   CC: Left hip pain  [id number]  Nicole Dawson is a 65 y.o. female coming in with complaint of left hip pain. Patient has been having left hip pain for approximately 8 weeks. Seem to radiate to the left upper thigh. Describes it as a dull throbbing aching pain that can have a severity is bad is 8 out of 10. Patient's on primary care provider and did have x-rays. X-rays were independently visualized by me. X-rays were unremarkable for any bony abnormality. Patient was given oral anti-inflammatories with no significant improvement. Denies any injury. Denies any weakness.    Past Medical History:  Diagnosis Date  . Allergy   . Breast cancer ([place])   . Depression   . Diverticulitis   . Diverticulosis   . Genital herpes   . Heart murmur   . HLD (hyperlipidemia)   . Hx of tobacco use, presenting hazards to health 07/08/2015   22 years at 1 PPD  . IBS (irritable bowel syndrome) 07/08/2015   Diagnosis by Dr. Conley Canal her prior PCP  . PONV (postoperative nausea and vomiting)   . Sigmoid diverticulitis 07/08/2015   Past Surgical History:  Procedure Laterality Date  . APPENDECTOMY  1962  . BILATERAL TOTAL MASTECTOMY WITH AXILLARY LYMPH NODE DISSECTION  2005   bilateral mastectomy  . BREAST RECONSTRUCTION  2005  . TUBAL LIGATION  1984   Social History   Social History  . Marital status: Married    Spouse name: N/A  . Number of children: 2  . Years of education: N/A   Occupational History  . CNA    Social History Main Topics  . Smoking status: Former Smoker    Packs/day: 1.00    Years: 30.00    Types: Cigarettes    Quit date: 07/07/1992  . Smokeless tobacco: Never Used  . Alcohol use No  . Drug use: No  . Sexual activity: Not Asked   Other Topics Concern  . None   Social History  Narrative   Lives in [place]. Married 2007. 2 kids (son and daughter). 2 grandkids (granddaughter and grandson). Both in [place]. Grandson on on the way (son's) in April 2016.       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       Hobbies: computer games, tv   No Known Allergies Family History  Problem Relation Age of Onset  . Hyperlipidemia Mother   . Hypertension Mother   . COPD Mother   . Stroke Mother     in 88s.   Marland Kitchen Heart attack Father     6.   . Tuberculosis Father     1/2 of lung removed.   . Hyperlipidemia Father   . Lymphoma Maternal Aunt     Past medical history, social, surgical and family history all reviewed in electronic medical record.  No pertanent information unless stated regarding to the chief complaint.   Review of Systems: No headache, visual changes, nausea, vomiting, diarrhea, constipation, dizziness, abdominal pain, skin rash, fevers, chills, night sweats, weight loss, swollen lymph nodes, body aches, joint swelling, muscle aches, chest pain, shortness of breath, mood changes.   Objective  Blood pressure 112/66, pulse 83, weight 162 lb (73.5 kg), SpO2 95 %.  General: No apparent distress alert and oriented x3 mood  and affect normal, dressed appropriately.  HEENT: Pupils equal, extraocular movements intact  Respiratory: Patient's speak in full sentences and does not appear short of breath  Cardiovascular: No lower extremity edema, non tender, no erythema  Skin: Warm dry intact with no signs of infection or rash on extremities or on axial skeleton.  Abdomen: Soft nontender  Neuro: Cranial nerves II through XII are intact, neurovascularly intact in all extremities with 2+ DTRs and 2+ pulses.  Lymph: No lymphadenopathy of posterior or anterior cervical chain or axillae bilaterally.  Gait normal with good balance and coordination.  MSK:  Non tender with full range of motion and good stability and symmetric strength and tone of shoulders,  elbows, wrist,  knee and ankles bilaterally.  Foot exam shows the patient has significant breakdown of the longitudinal arch on the medial side of the left foot compared to the contralateral side. Mild splaying between the first and second toes. [id number] ROM IR: 25 Deg, ER: 45 Deg, Flexion: 120 Deg, Extension: 100 Deg, Abduction: 35 Deg, Adduction: 25 Deg Strength IR: 5/5, ER: 5/5, Flexion: 5/5, Extension: 5/5, Abduction: 4/5, Adduction: 4/5 Pelvic alignment unremarkable to inspection and palpation. Standing hip rotation and gait without trendelenburg sign / unsteadiness. Tender over the greater to enteric area on the left sign. Positive Faber No SI joint tenderness and normal minimal SI movement. Mild pain over the pubic bone     Procedure note [id number]; 15 minutes spent for Therapeutic exercises as stated in above notes.  This included exercises focusing on stretching, strengthening, with significant focus on eccentric aspects.Hip strengthening exercises which included:  Pelvic tilt/bracing to help with proper recruitment of the lower abs and pelvic floor muscles  Glute strengthening to properly contract glutes without over-engaging low back and hamstrings - prone hip extension and glute bridge exercises Proper stretching techniques to increase effectiveness for the hip flexors, groin, quads, piriformic and low back when appropriate      Proper technique shown and discussed handout in great detail with ATC.  All questions were discussed and answered.   Impression and Recommendations:     This case required medical decision making of moderate complexity.      Note: This dictation was prepared with Dragon dictation along with smaller phrase technology. Any transcriptional errors that result from this process are unintentional.

## 2016-05-10 ENCOUNTER — Other Ambulatory Visit: Payer: Self-pay | Admitting: Family Medicine

## 2016-05-10 ENCOUNTER — Ambulatory Visit (INDEPENDENT_AMBULATORY_CARE_PROVIDER_SITE_OTHER): Admitting: Family Medicine

## 2016-05-10 ENCOUNTER — Encounter: Payer: Self-pay | Admitting: Family Medicine

## 2016-05-10 ENCOUNTER — Other Ambulatory Visit: Payer: Self-pay

## 2016-05-10 VITALS — BP 112/66 | HR 83 | Wt 162.0 lb

## 2016-05-10 DIAGNOSIS — M7062 Trochanteric bursitis, left hip: Secondary | ICD-10-CM

## 2016-05-10 DIAGNOSIS — M25552 Pain in left hip: Secondary | ICD-10-CM | POA: Diagnosis not present

## 2016-05-10 NOTE — Assessment & Plan Note (Signed)
Patient does have more of a greater trochanteric bursitis. We'll try topical anti-inflammatories, icing protocol, home exercises. Encourage patient to avoid certain activities. Patient's pes planus could be also contribute in. We discussed over-the-counter orthotics. Patient come back again in 4 weeks.

## 2016-05-10 NOTE — Patient Instructions (Signed)
Good to see you.  Ice 20 minutes 2 times daily. Usually after activity and before bed. Exercises 3 times a week.  pennsaid pinkie amount topically 2 times daily as needed.  Vitamin D 2000 iu Daily  Spenco orthotics "total support" online would be great  Other shoes that can help [place], New balance, Jennet Maduro or [employer].  See me again in 4 weeks to make sure you are doing well.

## 2016-05-17 ENCOUNTER — Other Ambulatory Visit: Payer: Self-pay | Admitting: Obstetrics & Gynecology

## 2016-05-18 LAB — CYTOLOGY - PAP

## 2016-05-20 ENCOUNTER — Other Ambulatory Visit

## 2016-05-20 DIAGNOSIS — M25552 Pain in left hip: Secondary | ICD-10-CM

## 2016-06-06 NOTE — Progress Notes (Signed)
[name] [name], [place] [address] Phone: [phone] Subjective:    CC: Left hip pain f/u  [id number]  Nicole Dawson is a 65 y.o. female coming in with complaint of left hip pain. Patient was previously seen and was having 2 months of hip pain. Was found to have a greater trochanteric bursitis as well as an osteitis pubis. Patient's x-rays have been unremarkable. Patient was to do home exercises, icing protocol, and core strengthening as well as hip abductor strengthening. Patient states no significant improvement the patient is also been noncompliant. Not doing any the exercises. Patient states that the worst pain still seems to be on the lateral aspect of the hip. Patient states that she has not truly feeling better at all. Patient states that he continues to affect some daily activities.    Past Medical History:  Diagnosis Date  . Allergy   . Breast cancer ([place])   . Depression   . Diverticulitis   . Diverticulosis   . Genital herpes   . Heart murmur   . HLD (hyperlipidemia)   . Hx of tobacco use, presenting hazards to health 07/08/2015   22 years at 1 PPD  . IBS (irritable bowel syndrome) 07/08/2015   Diagnosis by Dr. Conley Canal her prior PCP  . PONV (postoperative nausea and vomiting)   . Sigmoid diverticulitis 07/08/2015   Past Surgical History:  Procedure Laterality Date  . APPENDECTOMY  1962  . BILATERAL TOTAL MASTECTOMY WITH AXILLARY LYMPH NODE DISSECTION  2005   bilateral mastectomy  . BREAST RECONSTRUCTION  2005  . TUBAL LIGATION  1984   Social History   Social History  . Marital status: Married    Spouse name: N/A  . Number of children: 2  . Years of education: N/A   Occupational History  . CNA    Social History Main Topics  . Smoking status: Former Smoker    Packs/day: 1.00    Years: 30.00    Types: Cigarettes    Quit date: 07/07/1992  . Smokeless tobacco: Never Used  . Alcohol use No  . Drug use: No   . Sexual activity: Not Asked   Other Topics Concern  . None   Social History Narrative   Lives in [place]. Married 2007. 2 kids (son and daughter). 2 grandkids (granddaughter and grandson). Both in [place]. Grandson on on the way (son's) in April 2016.       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       Hobbies: computer games, tv   No Known Allergies Family History  Problem Relation Age of Onset  . Hyperlipidemia Mother   . Hypertension Mother   . COPD Mother   . Stroke Mother     in 51s.   Marland Kitchen Heart attack Father     28.   . Tuberculosis Father     1/2 of lung removed.   . Hyperlipidemia Father   . Lymphoma Maternal Aunt     Past medical history, social, surgical and family history all reviewed in electronic medical record.  No pertanent information unless stated regarding to the chief complaint.   Review of Systems: No headache, visual changes, nausea, vomiting, diarrhea, constipation, dizziness, abdominal pain, skin rash, fevers, chills, night sweats, weight loss, swollen lymph nodes, body aches, joint swelling, muscle aches, chest pain, shortness of breath, mood changes.   Objective  Blood pressure 122/76, pulse 73, height 5\' 2"  (1.575 m), weight  162 lb (73.5 kg), SpO2 97 %.  Systems examined below as of 06/07/16 General: NAD A&O x3 mood, affect normal  HEENT: Pupils equal, extraocular movements intact no nystagmus Respiratory: not short of breath at rest or with speaking Cardiovascular: No lower extremity edema, non tender Skin: Warm dry intact with no signs of infection or rash on extremities or on axial skeleton. Abdomen: Soft nontender, no masses Neuro: Cranial nerves  intact, neurovascularly intact in all extremities with 2+ DTRs and 2+ pulses. Lymph: No lymphadenopathy appreciated today  Gait normal with good balance and coordination.  MSK: Non tender with full range of motion and good stability and symmetric strength and tone of shoulders,  elbows, wrist,  knee hips and ankles bilaterally.   [id number] ROM IR: 25 Deg, ER: 35 Deg, Flexion: 120 Deg, Extension: 100 Deg, Abduction: 35 Deg, Adduction: 25 Deg Strength IR: 5/5, ER: 5/5, Flexion: 5/5, Extension: 5/5, Abduction: 4/5, Adduction: 4/5 Pelvic alignment unremarkable to inspection and palpation. Standing hip rotation and gait without trendelenburg sign / unsteadiness. Continued tenderness over the greater trochanteric area and worse than previous exam Positive Faber left side No SI joint tenderness and normal minimal SI movement. Pubic bone minorly inflamed     Procedure: Real-time Ultrasound Guided Injection of left  greater trochanteric bursitis secondary to patient's body habitus Device: GE Logiq E  Ultrasound guided injection is preferred based studies that show increased duration, increased effect, greater accuracy, decreased procedural pain, increased response rate, and decreased cost with ultrasound guided versus blind injection.  Verbal informed consent obtained.  Time-out conducted.  Noted no overlying erythema, induration, or other signs of local infection.  Skin prepped in a sterile fashion.  Local anesthesia: Topical Ethyl chloride.  With sterile technique and under real time ultrasound guidance:  Greater trochanteric area was visualized and patient's bursa was noted. A 22-gauge 3 inch needle was inserted and 4 cc of 0.5% Marcaine and 1 cc of Kenalog 40 mg/dL was injected. Pictures taken Completed without difficulty  Pain immediately resolved suggesting accurate placement of the medication.  Advised to call if fevers/chills, erythema, induration, drainage, or persistent bleeding.  Images permanently stored and available for review in the ultrasound unit.  Impression: Technically successful ultrasound guided injection.   Impression and Recommendations:     This case required medical decision making of moderate complexity.      Note: This dictation was  prepared with Dragon dictation along with smaller phrase technology. Any transcriptional errors that result from this process are unintentional.

## 2016-06-07 ENCOUNTER — Ambulatory Visit: Payer: Self-pay

## 2016-06-07 ENCOUNTER — Encounter: Payer: Self-pay | Admitting: Family Medicine

## 2016-06-07 ENCOUNTER — Ambulatory Visit (INDEPENDENT_AMBULATORY_CARE_PROVIDER_SITE_OTHER): Admitting: Family Medicine

## 2016-06-07 VITALS — BP 122/76 | HR 73 | Ht 62.0 in | Wt 162.0 lb

## 2016-06-07 DIAGNOSIS — M25552 Pain in left hip: Secondary | ICD-10-CM | POA: Diagnosis not present

## 2016-06-07 DIAGNOSIS — M7062 Trochanteric bursitis, left hip: Secondary | ICD-10-CM

## 2016-06-07 NOTE — Patient Instructions (Addendum)
Great to see you  We injected the hip today  Starting in 2 days start the exercises again 3 times a week.  Ice 20 minutes 2 times daily. Usually after activity and before bed. Sleep with pillow between knees Call me in 1 week if not better and we will get you into physical therapy  Otherwise see me again in 4 weeks.

## 2016-06-07 NOTE — Assessment & Plan Note (Signed)
Worsening exam today. Patient was given multiple different treatment options and patient elected try the injection. Was feeling significantly better after the injection. We discussed icing regimen, home exercises. Patient declined formal physical therapy. Patient will continue to be active. Encourage him to the exercises on a more regular basis. Follow-up again in 4 weeks.

## 2016-07-05 ENCOUNTER — Ambulatory Visit: Admitting: Family Medicine

## 2016-07-06 ENCOUNTER — Other Ambulatory Visit: Payer: Self-pay | Admitting: Family Medicine

## 2016-07-13 ENCOUNTER — Ambulatory Visit (INDEPENDENT_AMBULATORY_CARE_PROVIDER_SITE_OTHER): Payer: BLUE CROSS/BLUE SHIELD | Admitting: Family Medicine

## 2016-07-13 ENCOUNTER — Encounter: Payer: Self-pay | Admitting: Family Medicine

## 2016-07-13 DIAGNOSIS — M7062 Trochanteric bursitis, left hip: Secondary | ICD-10-CM | POA: Diagnosis not present

## 2016-07-13 NOTE — Patient Instructions (Signed)
Good to see you  Nicole Dawson is your friend.  Try to do exercises 2-3 times a week  We will get yo into pt  See me again in 4-6 weeks Happy holidays!

## 2016-07-13 NOTE — Progress Notes (Signed)
[name] [address], [place] [address] Phone: [phone] Subjective:    CC: Left hip pain f/u  [id number]  Nicole Dawson is a 65 y.o. female coming in with complaint of left hip pain. Patient was previously seen and was having 2 months of hip pain. Was found to have a greater trochanteric bursitis as well as an osteitis pubis. Patient's x-rays have been unremarkable. Patient was to do home exercises, icing protocol, and core strengthening as well as hip abductor strengthening. Patient states no significant improvement the patient is also been noncompliant. Not doing any the exercises. Patient states that the worst pain still seems to be on the lateral aspect of the hip. Patient states that she has not truly feeling better at all. Patient states that he continues to affect some daily activities.    Past Medical History:  Diagnosis Date  . Allergy   . Breast cancer ([place])   . Depression   . Diverticulitis   . Diverticulosis   . Genital herpes   . Heart murmur   . HLD (hyperlipidemia)   . Hx of tobacco use, presenting hazards to health 07/08/2015   22 years at 1 PPD  . IBS (irritable bowel syndrome) 07/08/2015   Diagnosis by Dr. Conley Canal her prior PCP  . PONV (postoperative nausea and vomiting)   . Sigmoid diverticulitis 07/08/2015   Past Surgical History:  Procedure Laterality Date  . APPENDECTOMY  1962  . BILATERAL TOTAL MASTECTOMY WITH AXILLARY LYMPH NODE DISSECTION  2005   bilateral mastectomy  . BREAST RECONSTRUCTION  2005  . TUBAL LIGATION  1984   Social History   Social History  . Marital status: Married    Spouse name: N/A  . Number of children: 2  . Years of education: N/A   Occupational History  . CNA    Social History Main Topics  . Smoking status: Former Smoker    Packs/day: 1.00    Years: 30.00    Types: Cigarettes    Quit date: 07/07/1992  . Smokeless tobacco: Never Used  . Alcohol use No  . Drug use: No   . Sexual activity: Not on file   Other Topics Concern  . Not on file   Social History Narrative   Lives in [place]. Married 2007. 2 kids (son and daughter). 2 grandkids (granddaughter and grandson). Both in [place]. Grandson on on the way (son's) in April 2016.       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       Hobbies: computer games, tv   No Known Allergies Family History  Problem Relation Age of Onset  . Hyperlipidemia Mother   . Hypertension Mother   . COPD Mother   . Stroke Mother     in 56s.   Marland Kitchen Heart attack Father     43.   . Tuberculosis Father     1/2 of lung removed.   . Hyperlipidemia Father   . Lymphoma Maternal Aunt     Past medical history, social, surgical and family history all reviewed in electronic medical record.  No pertanent information unless stated regarding to the chief complaint.   Review of Systems: No headache, visual changes, nausea, vomiting, diarrhea, constipation, dizziness, abdominal pain, skin rash, fevers, chills, night sweats, weight loss, swollen lymph nodes, body aches, joint swelling, muscle aches, chest pain, shortness of breath, mood changes.  .   Objective  There were no vitals taken for  this visit.  Systems examined below as of 07/13/16 General: NAD A&O x3 mood, affect normal  HEENT: Pupils equal, extraocular movements intact no nystagmus Respiratory: not short of breath at rest or with speaking Cardiovascular: No lower extremity edema, non tender Skin: Warm dry intact with no signs of infection or rash on extremities or on axial skeleton. Abdomen: Soft nontender, no masses Neuro: Cranial nerves  intact, neurovascularly intact in all extremities with 2+ DTRs and 2+ pulses. Lymph: No lymphadenopathy appreciated today  Gait normal with good balance and coordination.  MSK: Non tender with full range of motion and good stability and symmetric strength and tone of shoulders, elbows, wrist,  knee and ankles  bilaterally.     [id number] ROM IR: 25 Deg, ER: 35 Deg, Flexion: 120 Deg, Extension: 100 Deg, Abduction: 35 Deg, Adduction: 25 Deg Strength IR: 5/5, ER: 5/5, Flexion: 5/5, Extension: 5/5, Abduction: 4/5, Adduction: 4/5 Pelvic alignment unremarkable to inspection and palpation. Standing hip rotation and gait without trendelenburg sign / unsteadiness. Continue mild discomfort over the greater trochanteric area Positive Faber left side No SI joint tenderness and normal minimal SI movement. Pubic bone still tender as well       Impression and Recommendations:     This case required medical decision making of moderate complexity.      Note: This dictation was prepared with Dragon dictation along with smaller phrase technology. Any transcriptional errors that result from this process are unintentional.

## 2016-07-13 NOTE — Assessment & Plan Note (Signed)
No improvement at this time. Sent to physical therapy today. We discussed be more regular on her exercises. Patient is not taking the gabapentin regularly as well. We discussed further workup such as x-rays which patient declined today. Patient knows if worsening symptoms to come back sooner otherwise we will see her again in 4-6 weeks. Spent  25 minutes with patient face-to-face and had greater than 50% of counseling including as described above in assessment and plan.

## 2016-07-21 ENCOUNTER — Ambulatory Visit (INDEPENDENT_AMBULATORY_CARE_PROVIDER_SITE_OTHER): Payer: BLUE CROSS/BLUE SHIELD | Admitting: Family Medicine

## 2016-07-21 VITALS — BP 142/80 | HR 114 | Temp 99.9°F | Ht 62.0 in | Wt 160.8 lb

## 2016-07-21 DIAGNOSIS — M791 Myalgia, unspecified site: Secondary | ICD-10-CM

## 2016-07-21 DIAGNOSIS — B349 Viral infection, unspecified: Secondary | ICD-10-CM | POA: Diagnosis not present

## 2016-07-21 LAB — POCT INFLUENZA A/B
INFLUENZA B, POC: NEGATIVE
Influenza A, POC: NEGATIVE

## 2016-07-21 MED ORDER — HYDROCODONE-HOMATROPINE 5-1.5 MG/5ML PO SYRP: 5.0000 mL | ORAL_SOLUTION | Freq: Four times a day (QID) | ORAL | 0 refills | Status: DC | PRN

## 2016-07-21 NOTE — Progress Notes (Signed)
Subjective:     Patient ID: Nicole Dawson, female   DOB: Jan 20, 1951, 65 y.o.   MRN: [id number]  HPI Acute visit for upper respiratory illness. Started couple days ago. She's had some myalgias, nasal congestion, mostly nonproductive cough, chills, low-grade fever, and intermittent headaches. Her fever has been as high as 101 but promptly came down with Tylenol. Cough has been severe at times. Not relieved with Robitussin-DM. No wheezing. Denies any nausea, vomiting, or diarrhea. She works as a Furniture conservator/restorer  Past Medical History:  Diagnosis Date  . Allergy   . Breast cancer ([place])   . Depression   . Diverticulitis   . Diverticulosis   . Genital herpes   . Heart murmur   . HLD (hyperlipidemia)   . Hx of tobacco use, presenting hazards to health 07/08/2015   22 years at 1 PPD  . IBS (irritable bowel syndrome) 07/08/2015   Diagnosis by Dr. Conley Canal her prior PCP  . PONV (postoperative nausea and vomiting)   . Sigmoid diverticulitis 07/08/2015   Past Surgical History:  Procedure Laterality Date  . APPENDECTOMY  1962  . BILATERAL TOTAL MASTECTOMY WITH AXILLARY LYMPH NODE DISSECTION  2005   bilateral mastectomy  . BREAST RECONSTRUCTION  2005  . TUBAL LIGATION  1984    reports that she quit smoking about 24 years ago. Her smoking use included Cigarettes. She has a 30.00 pack-year smoking history. She has never used smokeless tobacco. She reports that she does not drink alcohol or use drugs. family history includes COPD in her mother; Heart attack in her father; Hyperlipidemia in her father and mother; Hypertension in her mother; Lymphoma in her maternal aunt; Stroke in her mother; Tuberculosis in her father. No Known Allergies   Review of Systems  Constitutional: Positive for chills, fatigue and fever.  HENT: Positive for congestion and sore throat.   Respiratory: Positive for cough.   Gastrointestinal: Negative for abdominal pain.  Genitourinary: Negative for dysuria.  Skin:  Negative for rash.  Neurological: Positive for headaches.  Hematological: Negative for adenopathy.       Objective:   Physical Exam  Constitutional: She appears well-developed and well-nourished.  HENT:  Right Ear: External ear normal.  Left Ear: External ear normal.  Mouth/Throat: Oropharynx is clear and moist. No oropharyngeal exudate.  Neck: Neck supple.  Cardiovascular: Normal rate and regular rhythm.   Pulmonary/Chest: Effort normal and breath sounds normal. No respiratory distress. She has no wheezes. She has no rales.  Lymphadenopathy:    She has no cervical adenopathy.       Assessment:     Febrile illness. Suspect viral syndrome. Influenza screen negative    Plan:     -Stay well-hydrated -Continue Tylenol or Motrin for relief of fever and body aches -Hycodan cough syrup 1 teaspoon daily at bedtime for severe cough -Follow-up for any shortness of breath, persistent fever, or other concerns. Work note written for today through Friday  Eulas Post MD [facility]

## 2016-07-21 NOTE — Patient Instructions (Signed)

## 2016-07-21 NOTE — Progress Notes (Signed)
Pre visit review using our clinic review tool, if applicable. No additional management support is needed unless otherwise documented below in the visit note. 

## 2016-07-23 ENCOUNTER — Encounter: Payer: Self-pay | Admitting: Family Medicine

## 2016-07-23 ENCOUNTER — Ambulatory Visit (INDEPENDENT_AMBULATORY_CARE_PROVIDER_SITE_OTHER): Payer: BLUE CROSS/BLUE SHIELD | Admitting: Family Medicine

## 2016-07-23 ENCOUNTER — Telehealth: Payer: Self-pay | Admitting: Family Medicine

## 2016-07-23 VITALS — BP 110/74 | HR 109 | Temp 98.9°F | Wt 158.0 lb

## 2016-07-23 DIAGNOSIS — M791 Myalgia, unspecified site: Secondary | ICD-10-CM

## 2016-07-23 DIAGNOSIS — R51 Headache: Secondary | ICD-10-CM

## 2016-07-23 DIAGNOSIS — R059 Cough, unspecified: Secondary | ICD-10-CM

## 2016-07-23 DIAGNOSIS — R05 Cough: Secondary | ICD-10-CM

## 2016-07-23 DIAGNOSIS — R519 Headache, unspecified: Secondary | ICD-10-CM

## 2016-07-23 DIAGNOSIS — R509 Fever, unspecified: Secondary | ICD-10-CM

## 2016-07-23 MED ORDER — HYDROCOD POLST-CPM POLST ER 10-8 MG/5ML PO SUER: 5.0000 mL | Freq: Two times a day (BID) | ORAL | 0 refills | Status: DC | PRN

## 2016-07-23 NOTE — Progress Notes (Addendum)
PCP: Garret Reddish, MD  Subjective:  Nicole Dawson is a 65 y.o. year old very pleasant female patient who presents with flu like illness with headaches, fatigue, fever, nasal congestion, mild.  sore throat, cough. Thought URI seen by Dr. Elease Hashimoto on 07/21/16 with symptoms starting 2-3 days prior including myalgias, nasal congestion, low grade fever, intermittent headaches, nonproctive cough, fever up to 101. Her flu swab was negative. She was given hycodan for cough, tylenol for rever/bdoyaches. Out of work until Saturday. She has continued to have fevers through this time including febrile last night. She does have some yellow discharge and sinus pressure. Overall fever curve peaked around 102 and has been trending down with 101.2 last night and none today. Known deviated septum. Has tried nasacort and zyrtec with little help. Now day 5 of illness.  -sick contacts/travel/risks: denies flu exposure.  -Hx of: allergies  ROS-denies  SOB, NVD, tooth pain. Admits to fatigue, fever. No neck stiffness  Pertinent Past Medical History-  Patient Active Problem List   Diagnosis Date Noted  . IBS (irritable bowel syndrome) 07/08/2015    Priority: Medium  . Restless leg syndrome 07/05/2014    Priority: Medium  . Hyperlipidemia 05/28/2009    Priority: Medium  . BREAST CANCER, HX OF 03/11/2009    Priority: Medium  . Hx of tobacco use, presenting hazards to health 07/08/2015    Priority: Low  . Wheezing 05/30/2013    Priority: Low  . Low back pain 08/03/2012    Priority: Low  . TRICUSPID REGURGITATION 05/28/2009    Priority: Low  . DEPRESSION 03/11/2009    Priority: Low  . Allergic rhinitis 03/11/2009    Priority: Low  . HEART MURMUR, HX OF 03/11/2009    Priority: Low  . History of herpes genitalis 03/11/2009    Priority: Low  . Greater trochanteric bursitis of left hip 05/10/2016  . Sigmoid diverticulitis 07/08/2015   Medications- reviewed  Current Outpatient Prescriptions   Medication Sig Dispense Refill  . aspirin EC 81 MG tablet Take 81 mg by mouth at bedtime.     Marland Kitchen atorvastatin (LIPITOR) 20 MG tablet Take 1 tablet (20 mg total) by mouth 2 (two) times a week. 45 tablet 2  . Calcium Carbonate (CALTRATE 600) 1500 MG TABS Take by mouth 2 (two) times daily.      . Carboxymethylcellul-Glycerin (LUBRICATING EYE DROPS OP) Apply 1 drop to eye as needed (dry eyes).     . cetirizine (ZYRTEC) 10 MG tablet Take 10 mg by mouth daily as needed for allergies.     . Coenzyme Q10 (CO Q-10) 100 MG CAPS Take by mouth daily.     Marland Kitchen gabapentin (NEURONTIN) 100 MG capsule TAKE ONE TO THREE CAPSULES BY MOUTH THREE TIMES DAILY 90 capsule 1  . montelukast (SINGULAIR) 10 MG tablet Take 10 mg by mouth at bedtime.    . Multiple Vitamin (MULTIVITAMIN) tablet Take 1 tablet by mouth daily.      . ondansetron (ZOFRAN-ODT) 4 MG disintegrating tablet Take 1 tablet (4 mg total) by mouth every 8 (eight) hours as needed for nausea or vomiting. 20 tablet 0  . triamcinolone (NASACORT AQ) 55 MCG/ACT AERO nasal inhaler Place 2 sprays into the nose daily. 1 Inhaler 12  . valACYclovir (VALTREX) 500 MG tablet Take 500 mg by mouth at bedtime.     . hycodan As per prior rx 140 mL 0   No current facility-administered medications for this visit.     Objective: BP 110/74 (BP  Location: Right Arm, Patient Position: Sitting, Cuff Size: Normal)   Pulse (!) 109   Temp 98.9 F (37.2 C) (Oral)   Wt 158 lb (71.7 kg)   SpO2 96%   BMI 28.90 kg/m  Gen: NAD, fatigued appearing HEENT: Turbinates erythematous, TM normal, pharynx mildly erythematous with no tonsilar exudate or edema, no sinus tenderness CV: RRR no murmurs rubs or gallops Lungs: CTAB no crackles, wheeze, rhonchi Abdomen: soft/nontender/nondistended/normal bowel sounds. No rebound or guarding.  Ext: no edema Skin: warm, dry, no rash, slightly warm to touch   Assessment/Plan:  Viral flu like illness History and exam today are suggestive of  viral infection most likely due to flu like viral illness. Have seen several folks this week with fever above 101 yet flu negative. Could be false negative but even if it was would not change management.   We did discuss possible sinus infection with sinus pressure and yellow discharge. We both favored viral illness. Offered antibiotic course given office closed through Tuesday but she declines. Wrote out of work through Tuesday. Also states hycodan does not really help like tussionex has- I filled rx but there was no generic option listed so sent in as brand name.   Finally, we reviewed reasons to return to care including if symptoms worsen or persist or new concerns arise-  particularly shortness of breath or fever. Also no driving for 8 hours after tussionex  Meds ordered this encounter  Medications  . chlorpheniramine-HYDROcodone (TUSSIONEX PENNKINETIC ER) 10-8 MG/5ML SUER    Sig: Take 5 mLs by mouth every 12 (twelve) hours as needed for cough.    Dispense:  140 mL    Refill:  0   Garret Reddish, MD

## 2016-07-23 NOTE — Telephone Encounter (Signed)
Pt saw Dr Elease Hashimoto on Wed, and pt states she is worse. Dr wrote her out of work through today, but now she is running a fever. 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  Pt temp is running 100.2 -101. Pt has a deep bad cough. Can she be worked in today? 10:30 am?

## 2016-07-23 NOTE — Patient Instructions (Signed)
Discussed augmentin option for potential sinusitis. Think more likely viral illness though so we opted to continue conservative care with rest/hydration and staying out of work.   If still having fevers by Tuesday or worsening symptoms return to see [place]  Mychart?

## 2016-07-23 NOTE — Progress Notes (Signed)
Pre visit review using our clinic review tool, if applicable. No additional management support is needed unless otherwise documented below in the visit note. 

## 2016-07-23 NOTE — Telephone Encounter (Signed)
Pt was given and appt and seen by Dr.Hunter.

## 2016-07-24 ENCOUNTER — Encounter: Payer: Self-pay | Admitting: Family Medicine

## 2016-08-09 ENCOUNTER — Encounter: Payer: Self-pay | Admitting: Family Medicine

## 2016-08-09 ENCOUNTER — Ambulatory Visit (INDEPENDENT_AMBULATORY_CARE_PROVIDER_SITE_OTHER): Payer: BLUE CROSS/BLUE SHIELD | Admitting: Family Medicine

## 2016-08-09 VITALS — BP 114/70 | HR 93 | Temp 97.7°F | Ht 62.0 in | Wt 156.4 lb

## 2016-08-09 DIAGNOSIS — A0811 Acute gastroenteropathy due to Norwalk agent: Secondary | ICD-10-CM

## 2016-08-09 NOTE — Patient Instructions (Signed)
Push fluids  Lots of rest  Wash your hands (and anyone who helps you)- you are still infectious until at least 24 hours past your last bout of diarrhea

## 2016-08-09 NOTE — Progress Notes (Signed)
Pre visit review using our clinic review tool, if applicable. No additional management support is needed unless otherwise documented below in the visit note. 

## 2016-08-09 NOTE — Progress Notes (Signed)
Subjective:  Nicole Dawson is a 66 y.o. year old very pleasant female patient who presents for/with See problem oriented charting ROS- no vomiting. No fever. No bodyaches or headaches. No abdominal pain   Past Medical History-  Patient Active Problem List   Diagnosis Date Noted  . IBS (irritable bowel syndrome) 07/08/2015    Priority: Medium  . Restless leg syndrome 07/05/2014    Priority: Medium  . Hyperlipidemia 05/28/2009    Priority: Medium  . BREAST CANCER, HX OF 03/11/2009    Priority: Medium  . Hx of tobacco use, presenting hazards to health 07/08/2015    Priority: Low  . Wheezing 05/30/2013    Priority: Low  . Low back pain 08/03/2012    Priority: Low  . TRICUSPID REGURGITATION 05/28/2009    Priority: Low  . DEPRESSION 03/11/2009    Priority: Low  . Allergic rhinitis 03/11/2009    Priority: Low  . HEART MURMUR, HX OF 03/11/2009    Priority: Low  . History of herpes genitalis 03/11/2009    Priority: Low  . Greater trochanteric bursitis of left hip 05/10/2016  . Sigmoid diverticulitis 07/08/2015    Medications- reviewed and updated Current Outpatient Prescriptions  Medication Sig Dispense Refill  . aspirin EC 81 MG tablet Take 81 mg by mouth at bedtime.     Marland Kitchen atorvastatin (LIPITOR) 20 MG tablet Take 1 tablet (20 mg total) by mouth 2 (two) times a week. 45 tablet 2  . Calcium Carbonate (CALTRATE 600) 1500 MG TABS Take by mouth 2 (two) times daily.      . Carboxymethylcellul-Glycerin (LUBRICATING EYE DROPS OP) Apply 1 drop to eye as needed (dry eyes).     . cetirizine (ZYRTEC) 10 MG tablet Take 10 mg by mouth daily as needed for allergies.     . chlorpheniramine-HYDROcodone (TUSSIONEX PENNKINETIC ER) 10-8 MG/5ML SUER Take 5 mLs by mouth every 12 (twelve) hours as needed for cough. 140 mL 0  . Coenzyme Q10 (CO Q-10) 100 MG CAPS Take by mouth daily.     Marland Kitchen gabapentin (NEURONTIN) 100 MG capsule TAKE ONE TO THREE CAPSULES BY MOUTH THREE TIMES DAILY 90 capsule 1  .  montelukast (SINGULAIR) 10 MG tablet Take 10 mg by mouth at bedtime.    . Multiple Vitamin (MULTIVITAMIN) tablet Take 1 tablet by mouth daily.      . ondansetron (ZOFRAN-ODT) 4 MG disintegrating tablet Take 1 tablet (4 mg total) by mouth every 8 (eight) hours as needed for nausea or vomiting. 20 tablet 0  . triamcinolone (NASACORT AQ) 55 MCG/ACT AERO nasal inhaler Place 2 sprays into the nose daily. 1 Inhaler 12  . valACYclovir (VALTREX) 500 MG tablet Take 500 mg by mouth at bedtime.      No current facility-administered medications for this visit.     Objective: BP 114/70 (BP Location: Right Arm, Patient Position: Sitting, Cuff Size: Normal)   Pulse 93   Temp 97.7 F (36.5 C) (Oral)   Ht 5\' 2"  (1.575 m)   Wt 156 lb 6.4 oz (70.9 kg)   SpO2 97%   BMI 28.61 kg/m  Gen: NAD, resting comfortably Mildly dry mucus membranes CV: RRR no murmurs rubs or gallops Lungs: CTAB no crackles, wheeze, rhonchi Abdomen: soft/nontender/nondistended/normal bowel sounds. No rebound or guarding.  Ext: no edema Skin: warm, dry, no rash  Assessment/Plan:  Norovirus S: Patient works at [employer] and recent norovirus outbreak. She started with multiple episodes of nonbloody nonbilious diarrhea on Saturday. Slowed down yesterday. No BM  yet today but still early in the day. No vomiting, fever, or abdominal pain. Symptoms mildly improving. Has had some nausea- finally feeling like eating again today. PO intake of fluids has been down too A/P: high probability norovirus given exposure. Seems like mild version. Push fluids. Out of work until 24 hours no diarrhea or vomiting. Advised home care.   The duration of face-to-face time during this visit was greater than 15 minutes. Greater than 50% of this time was spent in counseling about infectivity, discussion of needing to wash hands with any potential GI bugs- she states was using hand gel only.   Return precautions advised.  Garret Reddish, MD

## 2016-08-12 ENCOUNTER — Ambulatory Visit (INDEPENDENT_AMBULATORY_CARE_PROVIDER_SITE_OTHER): Payer: BLUE CROSS/BLUE SHIELD | Admitting: Family Medicine

## 2016-08-12 ENCOUNTER — Encounter: Payer: Self-pay | Admitting: Family Medicine

## 2016-08-12 DIAGNOSIS — M7062 Trochanteric bursitis, left hip: Secondary | ICD-10-CM | POA: Diagnosis not present

## 2016-08-12 NOTE — Patient Instructions (Signed)
Good to see you  [phone] is number for pt if you need it.  Stay active and keep trucking along You should do well.  If pain comes back you know where I am and we can try another injection if needed.

## 2016-08-12 NOTE — Assessment & Plan Note (Signed)
Doing well overall. 2 months after the injection. Encourage still for patient to go to potentially formal physical therapy. Otherwise follow-up as needed

## 2016-08-12 NOTE — Progress Notes (Signed)
[facility] [address] [place], [place] [address] Phone: [phone] Subjective:    CC: Left hip pain f/u  [id number]  Nicole Dawson is a 66 y.o. female coming in with complaint of left hip pain. Patient sent have more of a greater trochanteric bursitis. Patient unfortunately was not making significant improvement. Patient was to go to formal physical therapy but never went. Patient unfortunately also got sick on 2 different occasions. But with patient resting she's been feeling significantly better overall. Mild discomfort on the side of the hip. Nothing that is waking her up at night. Patient seems to be doing relatively well but only is noticing at night.    Past Medical History:  Diagnosis Date  . Allergy   . Breast cancer ([place])   . Depression   . Diverticulitis   . Diverticulosis   . Genital herpes   . Heart murmur   . HLD (hyperlipidemia)   . Hx of tobacco use, presenting hazards to health 07/08/2015   22 years at 1 PPD  . IBS (irritable bowel syndrome) 07/08/2015   Diagnosis by Dr. Conley Canal her prior PCP  . PONV (postoperative nausea and vomiting)   . Sigmoid diverticulitis 07/08/2015   Past Surgical History:  Procedure Laterality Date  . APPENDECTOMY  1962  . BILATERAL TOTAL MASTECTOMY WITH AXILLARY LYMPH NODE DISSECTION  2005   bilateral mastectomy  . BREAST RECONSTRUCTION  2005  . TUBAL LIGATION  1984   Social History   Social History  . Marital status: Married    Spouse name: N/A  . Number of children: 2  . Years of education: N/A   Occupational History  . CNA    Social History Main Topics  . Smoking status: Former Smoker    Packs/day: 1.00    Years: 30.00    Types: Cigarettes    Quit date: 07/07/1992  . Smokeless tobacco: Never Used  . Alcohol use No  . Drug use: No  . Sexual activity: Not Asked   Other Topics Concern  . None   Social History Narrative   Lives in [place]. Married 2007. 2 kids (son and  daughter). 2 grandkids (granddaughter and grandson). Both in [place]. Grandson on on the way (son's) in April 2016.       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       Hobbies: computer games, tv   No Known Allergies Family History  Problem Relation Age of Onset  . Hyperlipidemia Mother   . Hypertension Mother   . COPD Mother   . Stroke Mother     in 33s.   Marland Kitchen Heart attack Father     41.   . Tuberculosis Father     1/2 of lung removed.   . Hyperlipidemia Father   . Lymphoma Maternal Aunt     Past medical history, social, surgical and family history all reviewed in electronic medical record.  No pertanent information unless stated regarding to the chief complaint.   Review of Systems: No headache, visual changes, nausea, vomiting, diarrhea, constipation, dizziness, abdominal pain, skin rash, fevers, chills, night sweats, weight loss, swollen lymph nodes, body aches, joint swelling, muscle aches, chest pain, shortness of breath, mood changes.    .   Objective  Blood pressure 130/80, pulse 78, height 5\' 2"  (1.575 m), weight 156 lb (70.8 kg), SpO2 96 %.  Systems examined below as of 08/12/16 General: NAD A&O x3 mood, affect normal  HEENT:  Pupils equal, extraocular movements intact no nystagmus Respiratory: not short of breath at rest or with speaking Cardiovascular: No lower extremity edema, non tender Skin: Warm dry intact with no signs of infection or rash on extremities or on axial skeleton. Abdomen: Soft nontender, no masses Neuro: Cranial nerves  intact, neurovascularly intact in all extremities with 2+ DTRs and 2+ pulses. Lymph: No lymphadenopathy appreciated today  Gait normal with good balance and coordination.  MSK: Non tender with full range of motion and good stability and symmetric strength and tone of shoulders, elbows, wrist,  knee and ankles bilaterally.    [id number] Near full range of motion noted today. Negative straight leg test. Mild  discomfort over the lateral greater trochanter area but significantly improved from previous exam. Mild positive Faber test. Minimal pain over the piriformis. No pain over the lower back. Neurovascular intact distally.       Impression and Recommendations:     This case required medical decision making of moderate complexity.      Note: This dictation was prepared with Dragon dictation along with smaller phrase technology. Any transcriptional errors that result from this process are unintentional.

## 2016-09-08 ENCOUNTER — Other Ambulatory Visit: Payer: Self-pay | Admitting: Family Medicine

## 2016-11-10 ENCOUNTER — Other Ambulatory Visit: Payer: Self-pay | Admitting: Family Medicine

## 2017-01-03 ENCOUNTER — Other Ambulatory Visit: Payer: Self-pay | Admitting: Family Medicine

## 2017-02-04 ENCOUNTER — Other Ambulatory Visit: Payer: Self-pay | Admitting: Family Medicine

## 2017-03-01 ENCOUNTER — Encounter: Payer: Self-pay | Admitting: Family Medicine

## 2017-03-01 ENCOUNTER — Ambulatory Visit (INDEPENDENT_AMBULATORY_CARE_PROVIDER_SITE_OTHER): Payer: BLUE CROSS/BLUE SHIELD | Admitting: Family Medicine

## 2017-03-01 VITALS — BP 110/70 | HR 79 | Temp 98.3°F | Wt 157.5 lb

## 2017-03-01 DIAGNOSIS — B9789 Other viral agents as the cause of diseases classified elsewhere: Secondary | ICD-10-CM

## 2017-03-01 DIAGNOSIS — J069 Acute upper respiratory infection, unspecified: Secondary | ICD-10-CM

## 2017-03-01 MED ORDER — HYDROCOD POLST-CPM POLST ER 10-8 MG/5ML PO SUER: 5.0000 mL | Freq: Two times a day (BID) | ORAL | 0 refills | Status: DC | PRN

## 2017-03-01 NOTE — Patient Instructions (Signed)
Upper Respiratory Infection, Adult Most upper respiratory infections (URIs) are a viral infection of the air passages leading to the lungs. A URI affects the nose, throat, and upper air passages. The most common type of URI is nasopharyngitis and is typically referred to as "the common cold." URIs run their course and usually go away on their own. Most of the time, a URI does not require medical attention, but sometimes a bacterial infection in the upper airways can follow a viral infection. This is called a secondary infection. Sinus and middle ear infections are common types of secondary upper respiratory infections. Bacterial pneumonia can also complicate a URI. A URI can worsen asthma and chronic obstructive pulmonary disease (COPD). Sometimes, these complications can require emergency medical care and may be life threatening. What are the causes? Almost all URIs are caused by viruses. A virus is a type of germ and can spread from one person to another. What increases the risk? You may be at risk for a URI if:  You smoke.  You have chronic heart or lung disease.  You have a weakened defense (immune) system.  You are very young or very old.  You have nasal allergies or asthma.  You work in crowded or poorly ventilated areas.  You work in health care facilities or schools.  What are the signs or symptoms? Symptoms typically develop 2-3 days after you come in contact with a cold virus. Most viral URIs last 7-10 days. However, viral URIs from the influenza virus (flu virus) can last 14-18 days and are typically more severe. Symptoms may include:  Runny or stuffy (congested) nose.  Sneezing.  Cough.  Sore throat.  Headache.  Fatigue.  Fever.  Loss of appetite.  Pain in your forehead, behind your eyes, and over your cheekbones (sinus pain).  Muscle aches.  How is this diagnosed? Your health care provider may diagnose a URI by:  Physical exam.  Tests to check that your  symptoms are not due to another condition such as: ? Strep throat. ? Sinusitis. ? Pneumonia. ? Asthma.  How is this treated? A URI goes away on its own with time. It cannot be cured with medicines, but medicines may be prescribed or recommended to relieve symptoms. Medicines may help:  Reduce your fever.  Reduce your cough.  Relieve nasal congestion.  Follow these instructions at home:  Take medicines only as directed by your health care provider.  Gargle warm saltwater or take cough drops to comfort your throat as directed by your health care provider.  Use a warm mist humidifier or inhale steam from a shower to increase air moisture. This may make it easier to breathe.  Drink enough fluid to keep your urine clear or pale yellow.  Eat soups and other clear broths and maintain good nutrition.  Rest as needed.  Return to work when your temperature has returned to normal or as your health care provider advises. You may need to stay home longer to avoid infecting others. You can also use a face mask and careful hand washing to prevent spread of the virus.  Increase the usage of your inhaler if you have asthma.  Do not use any tobacco products, including cigarettes, chewing tobacco, or electronic cigarettes. If you need help quitting, ask your health care provider. How is this prevented? The best way to protect yourself from getting a cold is to practice good hygiene.  Avoid oral or hand contact with people with cold symptoms.  Wash your   hands often if contact occurs.  There is no clear evidence that vitamin C, vitamin E, echinacea, or exercise reduces the chance of developing a cold. However, it is always recommended to get plenty of rest, exercise, and practice good nutrition. Contact a health care provider if:  You are getting worse rather than better.  Your symptoms are not controlled by medicine.  You have chills.  You have worsening shortness of breath.  You have  brown or red mucus.  You have yellow or brown nasal discharge.  You have pain in your face, especially when you bend forward.  You have a fever.  You have swollen neck glands.  You have pain while swallowing.  You have white areas in the back of your throat. Get help right away if:  You have severe or persistent: ? Headache. ? Ear pain. ? Sinus pain. ? Chest pain.  You have chronic lung disease and any of the following: ? Wheezing. ? Prolonged cough. ? Coughing up blood. ? A change in your usual mucus.  You have a stiff neck.  You have changes in your: ? Vision. ? Hearing. ? Thinking. ? Mood. This information is not intended to replace advice given to you by your health care provider. Make sure you discuss any questions you have with your health care provider. Document Released: 01/12/2001 Document Revised: 03/21/2016 Document Reviewed: 10/24/2013 Elsevier Interactive Patient Education  2017 Elsevier Inc.  

## 2017-03-01 NOTE — Progress Notes (Signed)
Subjective:     Patient ID: Nicole Dawson, female   DOB: 04-13-1951, 66 y.o.   MRN: [id number]  HPI Patient is seen with one-week history of sinus congestion and sore throat. She is over the past couple days developed some mostly dry cough. She initially had sore throat and no symptoms persist. Nasal congestion is clear. No body aches. No fevers or chills. Denies any nausea, vomiting, or diarrhea. She has perennial allergies to dust and takes had baseline Nasacort, Singulair, and Zyrtec. Sore throat symptoms are mild to moderate. Cough occasionally severe at night and not relieved with over-the-counter medications  Past Medical History:  Diagnosis Date  . Allergy   . Breast cancer ([place])   . Depression   . Diverticulitis   . Diverticulosis   . Genital herpes   . Heart murmur   . HLD (hyperlipidemia)   . Hx of tobacco use, presenting hazards to health 07/08/2015   22 years at 1 PPD  . IBS (irritable bowel syndrome) 07/08/2015   Diagnosis by Dr. Conley Canal her prior PCP  . PONV (postoperative nausea and vomiting)   . Sigmoid diverticulitis 07/08/2015   Past Surgical History:  Procedure Laterality Date  . APPENDECTOMY  1962  . BILATERAL TOTAL MASTECTOMY WITH AXILLARY LYMPH NODE DISSECTION  2005   bilateral mastectomy  . BREAST RECONSTRUCTION  2005  . TUBAL LIGATION  1984    reports that she quit smoking about 24 years ago. Her smoking use included Cigarettes. She has a 30.00 pack-year smoking history. She has never used smokeless tobacco. She reports that she does not drink alcohol or use drugs. family history includes COPD in her mother; Heart attack in her father; Hyperlipidemia in her father and mother; Hypertension in her mother; Lymphoma in her maternal aunt; Stroke in her mother; Tuberculosis in her father. No Known Allergies   Review of Systems  Constitutional: Negative for chills and fever.  HENT: Positive for congestion and sore throat.   Respiratory: Positive for cough.         Objective:   Physical Exam  Constitutional: She appears well-developed and well-nourished.  HENT:  Right Ear: External ear normal.  Left Ear: External ear normal.  Mouth/Throat: Oropharynx is clear and moist.  Neck: Neck supple.  Cardiovascular: Normal rate and regular rhythm.   Pulmonary/Chest: Effort normal and breath sounds normal. No respiratory distress. She has no wheezes. She has no rales.  Lymphadenopathy:    She has no cervical adenopathy.       Assessment:     Probable viral URI with cough. Nonfocal exam    Plan:     -No clear indication for antibiotics at this time -Follow-up promptly for any fever or increasing shortness of breath -Limited Tussionex 1 teaspoon daily at bedtime for severe cough  Eulas Post MD [facility]

## 2017-03-04 ENCOUNTER — Other Ambulatory Visit: Payer: Self-pay | Admitting: Family Medicine

## 2017-04-29 ENCOUNTER — Other Ambulatory Visit: Payer: Self-pay | Admitting: Family Medicine

## 2017-05-05 ENCOUNTER — Other Ambulatory Visit: Payer: Self-pay | Admitting: *Deleted

## 2017-05-05 MED ORDER — GABAPENTIN 100 MG PO CAPS: ORAL_CAPSULE | ORAL | 0 refills | Status: DC

## 2017-05-27 ENCOUNTER — Other Ambulatory Visit: Payer: Self-pay | Admitting: Family Medicine

## 2017-05-31 ENCOUNTER — Encounter: Payer: Self-pay | Admitting: Family Medicine

## 2017-06-03 ENCOUNTER — Encounter: Payer: BLUE CROSS/BLUE SHIELD | Admitting: Family Medicine

## 2017-06-16 ENCOUNTER — Encounter: Payer: BLUE CROSS/BLUE SHIELD | Admitting: Family Medicine

## 2017-06-30 ENCOUNTER — Encounter: Payer: BLUE CROSS/BLUE SHIELD | Admitting: Family Medicine

## 2017-07-25 ENCOUNTER — Encounter: Payer: BLUE CROSS/BLUE SHIELD | Admitting: Family Medicine

## 2017-09-01 ENCOUNTER — Ambulatory Visit (INDEPENDENT_AMBULATORY_CARE_PROVIDER_SITE_OTHER): Payer: BLUE CROSS/BLUE SHIELD | Admitting: Family Medicine

## 2017-09-01 ENCOUNTER — Encounter: Payer: Self-pay | Admitting: Family Medicine

## 2017-09-01 VITALS — BP 108/76 | HR 78 | Temp 97.8°F | Ht 62.0 in | Wt 164.8 lb

## 2017-09-01 DIAGNOSIS — Z780 Asymptomatic menopausal state: Secondary | ICD-10-CM | POA: Diagnosis not present

## 2017-09-01 DIAGNOSIS — Z114 Encounter for screening for human immunodeficiency virus [HIV]: Secondary | ICD-10-CM | POA: Diagnosis not present

## 2017-09-01 DIAGNOSIS — R319 Hematuria, unspecified: Secondary | ICD-10-CM | POA: Diagnosis not present

## 2017-09-01 DIAGNOSIS — E785 Hyperlipidemia, unspecified: Secondary | ICD-10-CM

## 2017-09-01 DIAGNOSIS — Z0000 Encounter for general adult medical examination without abnormal findings: Secondary | ICD-10-CM

## 2017-09-01 LAB — URINALYSIS, MICROSCOPIC ONLY

## 2017-09-01 LAB — COMPREHENSIVE METABOLIC PANEL
ALK PHOS: 103 U/L (ref 39–117)
ALT: 24 U/L (ref 0–35)
AST: 19 U/L (ref 0–37)
Albumin: 4.4 g/dL (ref 3.5–5.2)
BILIRUBIN TOTAL: 1.1 mg/dL (ref 0.2–1.2)
BUN: 19 mg/dL (ref 6–23)
CALCIUM: 9.5 mg/dL (ref 8.4–10.5)
CO2: 33 meq/L — AB (ref 19–32)
Chloride: 102 mEq/L (ref 96–112)
Creatinine, Ser: 0.71 mg/dL (ref 0.40–1.20)
GFR: 87.9 mL/min (ref >60.00)
Glucose, Bld: 103 mg/dL — ABNORMAL HIGH (ref 70–99)
Potassium: 4.2 mEq/L (ref 3.5–5.1)
Sodium: 139 mEq/L (ref 135–145)
TOTAL PROTEIN: 7.6 g/dL (ref 6.0–8.3)

## 2017-09-01 LAB — LIPID PANEL
Cholesterol: 178 mg/dL (ref 0–200)
HDL: 66.1 mg/dL (ref >39.00)
LDL Cholesterol: 90 mg/dL (ref 0–99)
NonHDL: 112.03
TRIGLYCERIDES: 110 mg/dL (ref 0.0–149.0)
Total CHOL/HDL Ratio: 3
VLDL: 22 mg/dL (ref 0.0–40.0)

## 2017-09-01 LAB — POC URINALSYSI DIPSTICK (AUTOMATED)
Bilirubin, UA: NEGATIVE
Glucose, UA: NEGATIVE
KETONES UA: NEGATIVE
Nitrite, UA: NEGATIVE
PH UA: 5.5 (ref 5.0–8.0)
PROTEIN UA: NEGATIVE
Urobilinogen, UA: 0.2 E.U./dL

## 2017-09-01 LAB — CBC
HCT: 44.2 % (ref 36.0–46.0)
HEMOGLOBIN: 15 g/dL (ref 12.0–15.0)
MCHC: 33.9 g/dL (ref 30.0–36.0)
MCV: 96.2 fl (ref 78.0–100.0)
Platelets: 261 10*3/uL (ref 150.0–400.0)
RBC: 4.6 Mil/uL (ref 3.87–5.11)
RDW: 13.2 % (ref 11.5–15.5)
WBC: 5.6 10*3/uL (ref 4.0–10.5)

## 2017-09-01 MED ORDER — ATORVASTATIN CALCIUM 20 MG PO TABS: ORAL_TABLET | ORAL | 3 refills | Status: DC

## 2017-09-01 NOTE — Addendum Note (Signed)
Addended by: Kayren Eaves T on: 09/01/2017 11:41 AM   Modules accepted: Orders

## 2017-09-01 NOTE — Addendum Note (Signed)
Addended by: Kayren Eaves T on: 09/01/2017 11:33 AM   Modules accepted: Orders

## 2017-09-01 NOTE — Patient Instructions (Addendum)
Schedule your bone density test at check out desk  Consider shingrix at any pharmacy- let us know if you get it - 2 part series  Please stop by lab before you go

## 2017-09-01 NOTE — Addendum Note (Signed)
Addended by: Lucianne Lei M on: 09/01/2017 11:38 AM   Modules accepted: Orders

## 2017-09-01 NOTE — Progress Notes (Signed)
Phone: [phone]  Subjective:  Patient presents today for their annual physical. Chief complaint-noted.   See problem oriented charting- ROS- full  review of systems was completed and negative except for: seasonal allergies, red spots on skin (cherry angiomas as below)  The following were reviewed and entered/updated in epic: Past Medical History:  Diagnosis Date  . Allergy   . Breast cancer ([place])   . Depression   . Diverticulitis   . Diverticulosis   . Genital herpes   . Heart murmur   . HLD (hyperlipidemia)   . Hx of tobacco use, presenting hazards to health 07/08/2015   22 years at 1 PPD  . IBS (irritable bowel syndrome) 07/08/2015   Diagnosis by Dr. Conley Canal her prior PCP  . PONV (postoperative nausea and vomiting)   . Sigmoid diverticulitis 07/08/2015   Patient Active Problem List   Diagnosis Date Noted  . IBS (irritable bowel syndrome) 07/08/2015    Priority: Medium  . Restless leg syndrome 07/05/2014    Priority: Medium  . Hyperlipidemia 05/28/2009    Priority: Medium  . BREAST CANCER, HX OF 03/11/2009    Priority: Medium  . Greater trochanteric bursitis of left hip 05/10/2016    Priority: Low  . Sigmoid diverticulitis 07/08/2015    Priority: Low  . Hx of tobacco use, presenting hazards to health 07/08/2015    Priority: Low  . Wheezing 05/30/2013    Priority: Low  . Low back pain 08/03/2012    Priority: Low  . TRICUSPID REGURGITATION 05/28/2009    Priority: Low  . DEPRESSION 03/11/2009    Priority: Low  . Allergic rhinitis 03/11/2009    Priority: Low  . HEART MURMUR, HX OF 03/11/2009    Priority: Low  . History of herpes genitalis 03/11/2009    Priority: Low   Past Surgical History:  Procedure Laterality Date  . APPENDECTOMY  1962  . BILATERAL TOTAL MASTECTOMY WITH AXILLARY LYMPH NODE DISSECTION  2005   bilateral mastectomy  . BREAST RECONSTRUCTION  2005  . TUBAL LIGATION  1984    Family History  Problem Relation Age of Onset  .  Hyperlipidemia Mother   . Hypertension Mother   . COPD Mother   . Stroke Mother        in 62s.   Marland Kitchen Heart attack Father        54.   . Tuberculosis Father        1/2 of lung removed.   . Hyperlipidemia Father   . Lymphoma Maternal Aunt     Medications- reviewed and updated Current Outpatient Medications  Medication Sig Dispense Refill  . aspirin EC 81 MG tablet Take 81 mg by mouth at bedtime.     Marland Kitchen atorvastatin (LIPITOR) 20 MG tablet TAKE ONE TABLET BY MOUTH TWICE A WEEK 10 tablet 13  . Calcium Carbonate (CALTRATE 600) 1500 MG TABS Take by mouth 2 (two) times daily.      . Carboxymethylcellul-Glycerin (LUBRICATING EYE DROPS OP) Apply 1 drop to eye as needed (dry eyes).     . cetirizine (ZYRTEC) 10 MG tablet Take 10 mg by mouth daily as needed for allergies.     . Coenzyme Q10 (CO Q-10) 100 MG CAPS Take by mouth daily.     . cyanocobalamin 1000 MCG tablet Take 1,000 mcg by mouth daily.    Marland Kitchen gabapentin (NEURONTIN) 100 MG capsule TAKE 1 TO 3 CAPSULES BY MOUTH THREE TIMES DAILY 90 capsule 5  . ketotifen (ZADITOR) 0.025 % ophthalmic solution  Place 1 drop into both eyes daily.    . montelukast (SINGULAIR) 10 MG tablet Take 10 mg by mouth at bedtime.    . Multiple Vitamin (MULTIVITAMIN) tablet Take 1 tablet by mouth daily.      Marland Kitchen triamcinolone (NASACORT AQ) 55 MCG/ACT AERO nasal inhaler Place 2 sprays into the nose daily. 1 Inhaler 12  . valACYclovir (VALTREX) 500 MG tablet Take 500 mg by mouth at bedtime.     . ondansetron (ZOFRAN-ODT) 4 MG disintegrating tablet Take 1 tablet (4 mg total) by mouth every 8 (eight) hours as needed for nausea or vomiting. (Patient not taking: Reported on 09/01/2017) 20 tablet 0   No current facility-administered medications for this visit.     Allergies-reviewed and updated No Known Allergies  Social History   Socioeconomic History  . Marital status: Married    Spouse name: None  . Number of children: 2  . Years of education: None  . Highest  education level: None  Social Needs  . Financial resource strain: None  . Food insecurity - worry: None  . Food insecurity - inability: None  . Transportation needs - medical: None  . Transportation needs - non-medical: None  Occupational History  . Occupation: CNA  Tobacco Use  . Smoking status: Former Smoker    Packs/day: 1.00    Years: 30.00    Pack years: 30.00    Types: Cigarettes    Last attempt to quit: 07/07/1992    Years since quitting: 25.1  . Smokeless tobacco: Never Used  Substance and Sexual Activity  . Alcohol use: No  . Drug use: No  . Sexual activity: None  Other Topics Concern  . None  Social History Narrative   Lives in [place]. Married 2007. 2 kids (son and daughter). 2 grandkids (granddaughter and grandson). Both in [place]. Grandson on on the way (son's) in April 2016.       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       Hobbies: computer games, tv    Objective: BP 108/76 (BP Location: Left Arm, Patient Position: Sitting, Cuff Size: Large)   Pulse 78   Temp 97.8 F (36.6 C) (Oral)   Ht 5\' 2"  (1.575 m)   Wt 164 lb 12.8 oz (74.8 kg)   SpO2 96%   BMI 30.14 kg/m  Gen: NAD, resting comfortably HEENT: Mucous membranes are moist. Oropharynx normal Neck: no thyromegaly CV: RRR no murmurs rubs or gallops Lungs: CTAB no crackles, wheeze, rhonchi Abdomen: soft/nontender/nondistended/normal bowel sounds. No rebound or guarding.  Ext: no edema Skin: warm, dry Neuro: grossly normal, moves all extremities, PERRLA  Assessment/Plan:  67 y.o. female presenting for annual physical.  Health Maintenance counseling: 1. Anticipatory guidance: Patient counseled regarding regular dental exams -q6 months, eye exams -yearly, wearing seatbelts.  2. Risk factor reduction:  Advised patient of need for regular exercise and diet rich and fruits and vegetables to reduce risk of heart attack and stroke. Exercise- not exercising regularly but hip feeling .  Diet-knows she needs to make improvement here- is going to cut down on her sugar intake. Cut cut out her pepsi as well Wt Readings from Last 3 Encounters:  09/01/17 164 lb 12.8 oz (74.8 kg)  03/01/17 157 lb 8 oz (71.4 kg)  08/12/16 156 lb (70.8 kg)  3. Immunizations/screenings/ancillary studies- discussed shingrix availability issues  .  Discussed HIV screening- opts in Immunization History  Administered Date(s) Administered  . Hepatitis B 04/02/2013  .  Influenza-Unspecified 04/02/2013, 05/02/2014, 06/11/2014, 06/18/2015, 06/02/2016, 03/28/2017  . Td 05/28/2009  . Zoster 07/27/2016  4. Cervical cancer screening- 05/17/16 Dr. Stann Mainland- up to date.  5. Breast cancer screening-  breast exam today with Dr. Stann Mainland and mammogram - prophylactic mastectomy with history of breast cancer in 2005- follows with [facility] yearly - plastic surgeon still yearly 6. Colon cancer screening - 11/16/15 with 10 year repeat (polyp but hyperplastic only) 7. Skin cancer screening- no dermatologist. advised regular sunscreen use. Denies worrisome, changing, or new skin lesions.  8. Birth control/STD check- declines as postmenopausal/monogamous  9. Osteoporosis screening at 63- will get DEXA now- only a few months away from 21 10. Former smoker- 22 pack years and quit around 20 years ago. Get ua to be on safe side for potential hematuria.   Status of chronic or acute concerns   Asks about red spots on skin- cherry angiomas only  HLD- controlled on atorvastatin twice a week. Update lipids  History genital herpes- on valtrex 500mg  daily believe through Dr. Stann Mainland  Some varicose veins- asks about compression stockings- I advised.   IBS- may trial probiotics. Some incontinence around her hemorrhoids.   Low back pain/joint pain-continues to deal with this- low dose gabapentin helps  Seasonal allergies- uses singulair, zyrtec, nasonex. Still has intermittent issues with nosebleeds, PND, sneezing, tinnitus, cough.   Has  painful sex- has stopped with husbands back issues- she states will discuss with GYn aif wants to be active again  Lab/Order associations: [facility] health [facility]  Hyperlipidemia, unspecified hyperlipidemia type - Plan: CBC, Comprehensive metabolic panel, Lipid panel, POCT Urinalysis Dipstick (Automated)  Screening for HIV (human immunodeficiency virus) - Plan: HIV antibody  Postmenopausal - Plan: DG Bone Density  Meds ordered this encounter  Medications  . atorvastatin (LIPITOR) 20 MG tablet    Sig: TAKE ONE TABLET BY MOUTH TWICE A WEEK    Dispense:  36 tablet    Refill:  3   Return precautions advised.  Garret Reddish, MD

## 2017-09-02 ENCOUNTER — Encounter: Payer: Self-pay | Admitting: Family Medicine

## 2017-09-02 LAB — URINE CULTURE
MICRO NUMBER:: 90133898
RESULT: NO GROWTH
SPECIMEN QUALITY: ADEQUATE

## 2017-09-02 LAB — HIV ANTIBODY (ROUTINE TESTING W REFLEX): HIV 1&2 Ab, 4th Generation: NONREACTIVE

## 2017-11-11 ENCOUNTER — Other Ambulatory Visit: Payer: Self-pay | Admitting: Family Medicine

## 2017-11-24 ENCOUNTER — Ambulatory Visit: Payer: BLUE CROSS/BLUE SHIELD | Admitting: Family Medicine

## 2017-12-01 ENCOUNTER — Ambulatory Visit
Admission: RE | Admit: 2017-12-01 | Discharge: 2017-12-01 | Disposition: A | Discharge status: Home / Self Care | Payer: BLUE CROSS/BLUE SHIELD | Source: Ambulatory Visit | Attending: Family Medicine | Admitting: Family Medicine

## 2017-12-01 DIAGNOSIS — Z780 Asymptomatic menopausal state: Secondary | ICD-10-CM

## 2017-12-08 ENCOUNTER — Encounter: Payer: Self-pay | Admitting: Family Medicine

## 2017-12-08 ENCOUNTER — Ambulatory Visit (INDEPENDENT_AMBULATORY_CARE_PROVIDER_SITE_OTHER): Payer: BLUE CROSS/BLUE SHIELD | Admitting: Family Medicine

## 2017-12-08 VITALS — BP 118/80 | HR 74 | Temp 97.7°F | Ht 62.0 in | Wt 168.2 lb

## 2017-12-08 DIAGNOSIS — L989 Disorder of the skin and subcutaneous tissue, unspecified: Secondary | ICD-10-CM | POA: Diagnosis not present

## 2017-12-08 DIAGNOSIS — E669 Obesity, unspecified: Secondary | ICD-10-CM

## 2017-12-08 NOTE — Assessment & Plan Note (Addendum)
S: Patient is concerned about her weight gain.  Patient asks what could be causing this.  We reviewed some habits from her lifestyle- Not exercising at all. At least 1 pepsi a day. Milk at night. Could cut back on sweets.  Wt Readings from Last 3 Encounters:  12/08/17 168 lb 3.2 oz (76.3 kg)  09/01/17 164 lb 12.8 oz (74.8 kg)  03/01/17 157 lb 8 oz (71.4 kg)  A/P: Encouraged need for healthy eating, regular exercise, weight loss.  In particular we focused on 0 sugar sweetened beverages.  Also discussed some other habits through AVS.  Would be reasonable to start and incorporate one habit at a time to help with success of changes.  Even just 5 pounds off by follow-up would be considered a success  If she makes these changes and continues to struggle with losing weight we could check a TSH at future visit.  Last check 2016.

## 2017-12-08 NOTE — Patient Instructions (Addendum)
Check with [facility] about shingrix. It is a 2 shot series. If they will cover it we can have you back for nurse visit.   Spot on your back looks like a seborrheic keratosis. This is a benign lesion. If it grows or changes significantly let us know- we could biopsy it to be on safe side if that happens   5: fruits and vegetables per day (work on 9 per day if you are at 5. Try to make half of this at least veggies) 4: exercise 4-5 times per week for at least 30 minutes (walking counts!) 3: meals per day (don't skip breakfast!) Rare treats: 1 sweet per day maximum (2 cookies, 1 small cup of ice cream) 0: sugar sweetened beverages  These are general tips for healthy living. Try to start with 1 or 2 habit TODAY and make it a part of your life for several weeks  Once you have 1 or 2 habits down for several weeks, try to begin working on your next healthy habit. With every single step you take, you will be leading a healthier lifestyle!

## 2017-12-08 NOTE — Progress Notes (Signed)
Subjective:  Nicole Dawson is a 67 y.o. year old very pleasant female patient who presents for/with See problem oriented charting ROS-some mild groin pain with walking at times.  No edema.  No erythema around lesion on the back.  No recent significant growth around spot on back.  Past Medical History-  Patient Active Problem List   Diagnosis Date Noted  . IBS (irritable bowel syndrome) 07/08/2015    Priority: Medium  . Restless leg syndrome 07/05/2014    Priority: Medium  . Hyperlipidemia 05/28/2009    Priority: Medium  . BREAST CANCER, HX OF 03/11/2009    Priority: Medium  . Obesity (BMI 30.0-34.9) 12/08/2017    Priority: Low  . Greater trochanteric bursitis of left hip 05/10/2016    Priority: Low  . Sigmoid diverticulitis 07/08/2015    Priority: Low  . Hx of tobacco use, presenting hazards to health 07/08/2015    Priority: Low  . Wheezing 05/30/2013    Priority: Low  . Low back pain 08/03/2012    Priority: Low  . TRICUSPID REGURGITATION 05/28/2009    Priority: Low  . DEPRESSION 03/11/2009    Priority: Low  . Allergic rhinitis 03/11/2009    Priority: Low  . HEART MURMUR, HX OF 03/11/2009    Priority: Low  . History of herpes genitalis 03/11/2009    Priority: Low    Medications- reviewed and updated Current Outpatient Medications  Medication Sig Dispense Refill  . cholecalciferol (VITAMIN D) 1000 units tablet Take 1,000 Units by mouth 2 (two) times daily at 8 am and 10 pm.    . aspirin EC 81 MG tablet Take 81 mg by mouth at bedtime.     Marland Kitchen atorvastatin (LIPITOR) 20 MG tablet TAKE ONE TABLET BY MOUTH TWICE A WEEK 36 tablet 3  . Carboxymethylcellul-Glycerin (LUBRICATING EYE DROPS OP) Apply 1 drop to eye as needed (dry eyes).     . cetirizine (ZYRTEC) 10 MG tablet Take 10 mg by mouth daily as needed for allergies.     . Coenzyme Q10 (CO Q-10) 100 MG CAPS Take by mouth daily.     . cyanocobalamin 1000 MCG tablet Take 1,000 mcg by mouth daily.    Marland Kitchen gabapentin  (NEURONTIN) 100 MG capsule TAKE 1 TO 3 CAPSULES BY MOUTH THREE TIMES DAILY 90 capsule 5  . ketotifen (ZADITOR) 0.025 % ophthalmic solution Place 1 drop into both eyes daily.    . montelukast (SINGULAIR) 10 MG tablet Take 10 mg by mouth at bedtime.    . Multiple Vitamin (MULTIVITAMIN) tablet Take 1 tablet by mouth daily.      . ondansetron (ZOFRAN-ODT) 4 MG disintegrating tablet Take 1 tablet (4 mg total) by mouth every 8 (eight) hours as needed for nausea or vomiting. (Patient not taking: Reported on 09/01/2017) 20 tablet 0  . triamcinolone (NASACORT AQ) 55 MCG/ACT AERO nasal inhaler Place 2 sprays into the nose daily. 1 Inhaler 12  . valACYclovir (VALTREX) 500 MG tablet Take 500 mg by mouth at bedtime.      No current facility-administered medications for this visit.     Objective: BP 118/80 (BP Location: Left Arm, Patient Position: Sitting, Cuff Size: Normal)   Pulse 74   Temp 97.7 F (36.5 C) (Oral)   Ht 5\' 2"  (1.575 m)   Wt 168 lb 3.2 oz (76.3 kg)   SpO2 96%   BMI 30.76 kg/m  Gen: NAD, resting comfortably CV: RRR  Lungs: Nonlabored normal respiratory rate Abdomen: Obese Skin: warm, dry, raised warty  stuck on lesion in central back primarily brown MSK-normal flexion, internal rotation, external rotation of bilateral hips  Assessment/Plan:  Other notes: 1.  Reassuring hip exam.  Mild pain in groin with walking up or down hills at times.  No trouble when walking flat.  We discussed stretching before exercise.  We also discussed exercising more.  We also discussed role of weight loss and reducing pain with activity  Skin lesion  s: Patient has noted a spot on her back which she is worried about.  Seems to be raised off the skin A/P: This appears to be a seborrheic keratosis.  I asked patient to keep an eye on this and if it grows further, changes color- we could consider shave biopsy.  She has also seen in the skin surgery center before and we discussed doing skin cancer screening  with them- she opts for this option  Obesity (BMI 30.0-34.9) S: Patient is concerned about her weight gain.  Patient asks what could be causing this.  We reviewed some habits from her lifestyle- Not exercising at all. At least 1 pepsi a day. Milk at night. Could cut back on sweets.  Wt Readings from Last 3 Encounters:  12/08/17 168 lb 3.2 oz (76.3 kg)  09/01/17 164 lb 12.8 oz (74.8 kg)  03/01/17 157 lb 8 oz (71.4 kg)  A/P: Encouraged need for healthy eating, regular exercise, weight loss.  In particular we focused on 0 sugar sweetened beverages.  Also discussed some other habits through AVS.  Would be reasonable to start and incorporate one habit at a time to help with success of changes.  Even just 5 pounds off by follow-up would be considered a success  If she makes these changes and continues to struggle with losing weight we could check a TSH at future visit.  Last check 2016.  Future Appointments  Date Time Provider [place]  01/19/2018 10:00 AM [facility] DX DEXA 1 [facility] CE   Time Stamp The duration of face-to-face time during this visit was greater than 15 minutes. Greater than 50% of this time was spent in counseling, explanation of diagnosis, planning of further management, and/or coordination of care including healthy lifestyle habits, discussion on seborrheic keratosis, importance of regular exercise and weight loss to reduce stress on the body.     Return precautions advised.  Garret Reddish, MD

## 2018-01-11 ENCOUNTER — Encounter: Payer: Self-pay | Admitting: Family Medicine

## 2018-01-11 ENCOUNTER — Ambulatory Visit (INDEPENDENT_AMBULATORY_CARE_PROVIDER_SITE_OTHER): Payer: Medicare Other | Admitting: Family Medicine

## 2018-01-11 VITALS — BP 126/80 | HR 73 | Temp 97.8°F | Ht 62.0 in | Wt 161.6 lb

## 2018-01-11 DIAGNOSIS — J069 Acute upper respiratory infection, unspecified: Secondary | ICD-10-CM | POA: Diagnosis not present

## 2018-01-11 MED ORDER — IPRATROPIUM BROMIDE 0.06 % NA SOLN: 2.0000 | Freq: Four times a day (QID) | NASAL | 0 refills | Status: DC

## 2018-01-11 NOTE — Progress Notes (Signed)
PCP: Marin Olp, MD  Subjective:  Nicole Dawson is a 67 y.o. year old very pleasant female patient who presents with Upper Respiratory infection symptoms including nasal congestion, scratchy throat, cough - mostly nonproductive -started: a week ago, symptoms are variable -previous treatments: tylenol, zyrtec -sick contacts: husband has been ill with similar -Hx of: allergies- on zyrtec  ROS-denies fever, SOB, NVD, tooth pain  Pertinent Past Medical History-  Patient Active Problem List   Diagnosis Date Noted  . IBS (irritable bowel syndrome) 07/08/2015    Priority: Medium  . Restless leg syndrome 07/05/2014    Priority: Medium  . Hyperlipidemia 05/28/2009    Priority: Medium  . BREAST CANCER, HX OF 03/11/2009    Priority: Medium  . Obesity (BMI 30.0-34.9) 12/08/2017    Priority: Low  . Greater trochanteric bursitis of left hip 05/10/2016    Priority: Low  . Sigmoid diverticulitis 07/08/2015    Priority: Low  . Hx of tobacco use, presenting hazards to health 07/08/2015    Priority: Low  . Wheezing 05/30/2013    Priority: Low  . Low back pain 08/03/2012    Priority: Low  . TRICUSPID REGURGITATION 05/28/2009    Priority: Low  . DEPRESSION 03/11/2009    Priority: Low  . Allergic rhinitis 03/11/2009    Priority: Low  . HEART MURMUR, HX OF 03/11/2009    Priority: Low  . History of herpes genitalis 03/11/2009    Priority: Low    Medications- reviewed  Current Outpatient Medications  Medication Sig Dispense Refill  . aspirin EC 81 MG tablet Take 81 mg by mouth at bedtime.     Marland Kitchen atorvastatin (LIPITOR) 20 MG tablet TAKE ONE TABLET BY MOUTH TWICE A WEEK 36 tablet 3  . Carboxymethylcellul-Glycerin (LUBRICATING EYE DROPS OP) Apply 1 drop to eye as needed (dry eyes).     . cetirizine (ZYRTEC) 10 MG tablet Take 10 mg by mouth daily as needed for allergies.     . cholecalciferol (VITAMIN D) 1000 units tablet Take 1,000 Units by mouth 2 (two) times daily at 8 am and 10  pm.    . Coenzyme Q10 (CO Q-10) 100 MG CAPS Take by mouth daily.     . cyanocobalamin 1000 MCG tablet Take 1,000 mcg by mouth daily.    Marland Kitchen gabapentin (NEURONTIN) 100 MG capsule TAKE 1 TO 3 CAPSULES BY MOUTH THREE TIMES DAILY 90 capsule 5  . ketotifen (ZADITOR) 0.025 % ophthalmic solution Place 1 drop into both eyes daily.    . montelukast (SINGULAIR) 10 MG tablet Take 10 mg by mouth at bedtime.    . Multiple Vitamin (MULTIVITAMIN) tablet Take 1 tablet by mouth daily.      Marland Kitchen triamcinolone (NASACORT AQ) 55 MCG/ACT AERO nasal inhaler Place 2 sprays into the nose daily. 1 Inhaler 12  . valACYclovir (VALTREX) 500 MG tablet Take 500 mg by mouth at bedtime.      No current facility-administered medications for this visit.     Objective: BP 126/80 (BP Location: Left Arm, Patient Position: Sitting, Cuff Size: Normal)   Pulse 73   Temp 97.8 F (36.6 C) (Oral)   Ht 5\' 2"  (1.575 m)   Wt 161 lb 9.6 oz (73.3 kg)   SpO2 97%   BMI 29.56 kg/m  Gen: NAD, resting comfortably HEENT: Turbinates erythematous- some dried blood in right nare- some clear discharge in both, TM normal, pharynx mildly erythematous with no tonsilar exudate or edema, no sinus tenderness CV: RRR no murmurs  rubs or gallops Lungs: CTAB no crackles, wheeze, rhonchi Ext: no edema Skin: warm, dry, no rash Neuro: grossly normal, moves all extremities  Assessment/Plan:  Upper Respiratory infection History and exam today are suggestive of viral infection most likely due to upper respiratory infection. Symptomatic treatment with: ipratropium nasal spray perhaps twice a day ore more if needed over next few days. Tylenol every 8 hours to help with throat discomfort. Sore throat lozenges also advised. Also a small amount of honey every few hours can provide some throat comfort and helpw ith cough.   We discussed that we did not find any infection that had higher probability of being bacterial such as pneumonia or strep throat. We discussed  signs that bacterial infection may have developed particularly fever or shortness of breath. Likely course of 2 weeks. Patient is contagious and advised good handwashing and consideration of mask If going to be in public places.   Finally, we reviewed reasons to return to care including if symptoms worsen or persist or new concerns arise- once again particularly shortness of breath or fever.  Meds ordered this encounter  Medications  . ipratropium (ATROVENT) 0.06 % nasal spray    Sig: Place 2 sprays into both nostrils 4 (four) times daily.    Dispense:  15 mL    Refill:  0    Garret Reddish, MD

## 2018-01-11 NOTE — Patient Instructions (Signed)
Upper Respiratory infection History and exam today are suggestive of viral infection most likely due to upper respiratory infection. Symptomatic treatment with: ipratropium nasal spray perhaps twice a day ore more if needed over next few days. Tylenol every 8 hours to help with throat discomfort. Sore throat lozenges also advised. Also a small amount of honey every few hours can provide some throat comfort and helpw ith cough.   We discussed that we did not find any infection that had higher probability of being bacterial such as pneumonia or strep throat. We discussed signs that bacterial infection may have developed particularly fever or shortness of breath. Likely course of 2 weeks. Patient is contagious and advised good handwashing and consideration of mask If going to be in public places.   Finally, we reviewed reasons to return to care including if symptoms worsen or persist or new concerns arise- once again particularly shortness of breath or fever.

## 2018-01-12 ENCOUNTER — Telehealth: Payer: Self-pay | Admitting: Family Medicine

## 2018-01-12 NOTE — Telephone Encounter (Unsigned)
Copied from [id number]. Topic: Quick Communication - See Telephone Encounter >> Jan 12, 2018  4:19 PM Neva Seat wrote: Insurance is not paying for pt's gabapentin (NEURONTIN) 100 MG capsule.  Pt is wondering why.   Asking if Dr. Yong Channel could prescribe an Rx that will be covered under her insurance. Call pt back to discuss.

## 2018-01-12 NOTE — Telephone Encounter (Signed)
See note

## 2018-01-13 NOTE — Telephone Encounter (Signed)
Called and spoke with patient. Instructed patient to call her insurance company. She states the pharmacy was just told her insurance is on hold. She verbalized understanding

## 2018-01-19 ENCOUNTER — Ambulatory Visit
Admission: RE | Admit: 2018-01-19 | Discharge: 2018-01-19 | Disposition: A | Discharge status: Home / Self Care | Payer: BLUE CROSS/BLUE SHIELD | Source: Ambulatory Visit | Attending: Family Medicine | Admitting: Family Medicine

## 2018-01-19 ENCOUNTER — Encounter: Payer: Self-pay | Admitting: Family Medicine

## 2018-01-19 DIAGNOSIS — M8580 Other specified disorders of bone density and structure, unspecified site: Secondary | ICD-10-CM | POA: Insufficient documentation

## 2018-01-30 ENCOUNTER — Encounter: Payer: Self-pay | Admitting: Family Medicine

## 2018-01-30 ENCOUNTER — Ambulatory Visit (INDEPENDENT_AMBULATORY_CARE_PROVIDER_SITE_OTHER): Payer: Medicare Other | Admitting: Family Medicine

## 2018-01-30 VITALS — BP 120/84 | HR 85 | Temp 98.3°F | Ht 62.0 in | Wt 166.4 lb

## 2018-01-30 DIAGNOSIS — J029 Acute pharyngitis, unspecified: Secondary | ICD-10-CM | POA: Diagnosis not present

## 2018-01-30 DIAGNOSIS — J301 Allergic rhinitis due to pollen: Secondary | ICD-10-CM

## 2018-01-30 LAB — POCT RAPID STREP A (OFFICE): Rapid Strep A Screen: NEGATIVE

## 2018-01-30 MED ORDER — PREDNISONE 20 MG PO TABS: ORAL_TABLET | ORAL | 0 refills | Status: DC

## 2018-01-30 NOTE — Progress Notes (Signed)
Subjective:  Nicole Dawson is a 67 y.o. year old very pleasant female patient who presents for/with See problem oriented charting ROS- no fever or shortness of breath. No wheeze or edema. Sinuses feel dry. Throat feels sore.    Past Medical History-  Patient Active Problem List   Diagnosis Date Noted  . IBS (irritable bowel syndrome) 07/08/2015    Priority: Medium  . Restless leg syndrome 07/05/2014    Priority: Medium  . Hyperlipidemia 05/28/2009    Priority: Medium  . BREAST CANCER, HX OF 03/11/2009    Priority: Medium  . Obesity (BMI 30.0-34.9) 12/08/2017    Priority: Low  . Greater trochanteric bursitis of left hip 05/10/2016    Priority: Low  . Sigmoid diverticulitis 07/08/2015    Priority: Low  . Hx of tobacco use, presenting hazards to health 07/08/2015    Priority: Low  . Wheezing 05/30/2013    Priority: Low  . Low back pain 08/03/2012    Priority: Low  . TRICUSPID REGURGITATION 05/28/2009    Priority: Low  . DEPRESSION 03/11/2009    Priority: Low  . Allergic rhinitis 03/11/2009    Priority: Low  . HEART MURMUR, HX OF 03/11/2009    Priority: Low  . History of herpes genitalis 03/11/2009    Priority: Low  . Osteopenia 01/19/2018    Medications- reviewed and updated Current Outpatient Medications  Medication Sig Dispense Refill  . aspirin EC 81 MG tablet Take 81 mg by mouth at bedtime.     Marland Kitchen atorvastatin (LIPITOR) 20 MG tablet TAKE ONE TABLET BY MOUTH TWICE A WEEK 36 tablet 3  . Carboxymethylcellul-Glycerin (LUBRICATING EYE DROPS OP) Apply 1 drop to eye as needed (dry eyes).     . cetirizine (ZYRTEC) 10 MG tablet Take 10 mg by mouth daily as needed for allergies.     . cholecalciferol (VITAMIN D) 1000 units tablet Take 1,000 Units by mouth 2 (two) times daily at 8 am and 10 pm.    . Coenzyme Q10 (CO Q-10) 100 MG CAPS Take by mouth daily.     . cyanocobalamin 1000 MCG tablet Take 1,000 mcg by mouth daily.    Marland Kitchen gabapentin (NEURONTIN) 100 MG capsule TAKE 1 TO  3 CAPSULES BY MOUTH THREE TIMES DAILY 90 capsule 5  . ipratropium (ATROVENT) 0.06 % nasal spray Place 2 sprays into both nostrils 4 (four) times daily. 15 mL 0  . ketotifen (ZADITOR) 0.025 % ophthalmic solution Place 1 drop into both eyes daily.    . montelukast (SINGULAIR) 10 MG tablet Take 10 mg by mouth at bedtime.    . Multiple Vitamin (MULTIVITAMIN) tablet Take 1 tablet by mouth daily.      . predniSONE (DELTASONE) 20 MG tablet Take 1 tablet by mouth daily for 5 days, then 1/2 tablet daily for 2 days 6 tablet 0  . triamcinolone (NASACORT AQ) 55 MCG/ACT AERO nasal inhaler Place 2 sprays into the nose daily. 1 Inhaler 12  . valACYclovir (VALTREX) 500 MG tablet Take 500 mg by mouth at bedtime.      No current facility-administered medications for this visit.     Objective: BP 120/84 (BP Location: Left Arm, Patient Position: Sitting, Cuff Size: Normal)   Pulse 85   Temp 98.3 F (36.8 C) (Oral)   Ht 5\' 2"  (1.575 m)   Wt 166 lb 6.4 oz (75.5 kg)   SpO2 95%   BMI 30.43 kg/m  Gen: NAD, resting comfortably Erythema through pharynx with signs of drainage. Tonsils  not enlarged and without exudate. TM normal bilaterally. Turbinates erythematous, edematous and with dried yellow discharge.  CV: RRR no murmurs rubs or gallops Lungs: CTAB no crackles, wheeze, rhonchi Abdomen: soft/nontender/nondistended/normal bowel sounds.  Ext: no edema Skin: warm, dry  Assessment/Plan:  Sore throat - Plan: POCT rapid strep A  Seasonal allergic rhinitis due to pollen S: Cough was improved though was minimal at last visit. Sore throat was very minimal within 1-2 weeks of our last visit on 01/11/18- she thought symptoms were almost gone and then worsened again. Currently- sore throat is the main thing. Was using ipratropium and felt too dry. No fever with it. Very initermittent cough but can get in coughing fits- better with cough drops or delsym. Putting new carpet in at [facility].   Hurts worse the longer  she waits to cough or swallow.   Singulair, zyrtec, nasocort have continued to be used.  A/P: From avs "This looks like your allergies are still poorly controlled. Lets boost with prednisone for 7 days. Switch zyrtec to xyzal over next month or so- then can switch back if better.   Also try your nasal saline rinses perhaps once a day to help with lubrication (neti pot)   If no better in 14 days please see me back"  Lab/Order associations: Sore throat - Plan: POCT rapid strep A  Seasonal allergic rhinitis due to pollen  Meds ordered this encounter  Medications  . predniSONE (DELTASONE) 20 MG tablet    Sig: Take 1 tablet by mouth daily for 5 days, then 1/2 tablet daily for 2 days    Dispense:  6 tablet    Refill:  0   Return precautions advised.  Garret Reddish, MD

## 2018-01-30 NOTE — Patient Instructions (Addendum)
Health Maintenance Due  Topic Date Due  . PNA vac Low Risk Adult (1 of 2 - PCV13) -  Future visit  01/04/2018   Once you are well: Please check with your pharmacy to see if they have the shingrix vaccine. If they do- please get this immunization and update [place] by phone call or mychart with dates you receive the vaccine.   This looks like your allergies are still poorly controlled. Lets boost with prednisone for 7 days. Switch zyrtec to xyzal over next month or so- then can switch back if better.   Also try your nasal saline rinses perhaps once a day to help with lubrication (neti pot)   If no better in 14 days please see me back  Strep test negative

## 2018-03-21 ENCOUNTER — Ambulatory Visit: Payer: Self-pay | Admitting: Family Medicine

## 2018-03-21 NOTE — Telephone Encounter (Signed)
Pt c/o left side of chest "muscle spasm" that pt describes a "sharp" and moderate in intensity. Pt stated the pain is "more annoying than hurting." Pt stated that the "spasms" are located where she had breast reconstruction surgery. Pt stated chest pain began last Monday and stated that the pain comes and goes. Pain lasts 10 to 15 minutes then goes away.  Pt also c/o neck pain that started last week. Pt stated the pain is moderate and she has been treating with ice packs to ease the pain. She stated that the neck pain is at the left side of her neck. Explained to pt that chest pain that she is having could be cardiac in nature and she will have to come in to be evaluated.  Chest pain disposition is to see physician within 24 hours and disposition for muscle aches is to see PCP when office is open.  Tana Felts at PCP office and discussed triage. After this I transferred the call to [facility] at the PCP office per her request.  Disposition to be decided by PCP.  Reason for Disposition . [1] MODERATE pain (e.g., interferes with normal activities) AND [2] present > 3 days    Neck pain began last week and then the chest pain or "muscle spasms" or "sharp pains" where her reconstructive surgery stitches are. . [1] Chest pain lasts > 5 minutes AND [2] occurred > 3 days ago (72 hours) AND [3] NO chest pain or cardiac symptoms now    Pt stated that chest pain lasts 10-15 minutes  Answer Assessment - Initial Assessment Questions 1. LOCATION: "Where does it hurt?"       Left side of chest 2. RADIATION: "Does the pain go anywhere else?" (e.g., into neck, jaw, arms, back)     No but had dull pain to neck and iced the neck then after she layed down ice pak on her left side 3. ONSET: "When did the chest pain begin?" (Minutes, hours or days)      Last Monday 4. PATTERN "Does the pain come and go, or has it been constant since it started?"  "Does it get worse with exertion?"      Comes and goes 5. DURATION: "How  long does it last" (e.g., seconds, minutes, hours)     10-15 minutes last night 10 minutes today on the way to work and today lasted 10 minutes 6. SEVERITY: "How bad is the pain?"  (e.g., Scale 1-10; mild, moderate, or severe)    - MILD (1-3): doesn't interfere with normal activities     - MODERATE (4-7): interferes with normal activities or awakens from sleep    - SEVERE (8-10): excruciating pain, unable to do any normal activities       Moderate 7/10 "More annoying than hurting" 7. CARDIAC RISK FACTORS: "Do you have any history of heart problems or risk factors for heart disease?" (e.g., prior heart attack, angina; high blood pressure, diabetes, being overweight, high cholesterol, smoking, or strong family history of heart disease)     Father with heart attack. Quit smoking20 years ago 14. PULMONARY RISK FACTORS: "Do you have any history of lung disease?"  (e.g., blood clots in lung, asthma, emphysema, birth control pills)     no 9. CAUSE: "What do you think is causing the chest pain?"     Muscle spasms 10. OTHER SYMPTOMS: "Do you have any other symptoms?" (e.g., dizziness, nausea, vomiting, sweating, fever, difficulty breathing, cough)       Cough  but pt thinks it is her allergies 11. PREGNANCY: "Is there any chance you are pregnant?" "When was your last menstrual period?"       n/a  Answer Assessment - Initial Assessment Questions 1. ONSET: "When did the muscle aches or body pains start?"      Last Monday 2. LOCATION: "What part of your body is hurting?" (e.g., entire body, arms, legs)      Left side of chest and left neck 3. SEVERITY: "How bad is the pain?" (Scale 1-10; or mild, moderate, severe)   - MILD (1-3): doesn't interfere with normal activities    - MODERATE (4-7): interferes with normal activities or awakens from sleep    - SEVERE (8-10):  excruciating pain, unable to do any normal activities      moderate 4. CAUSE: "What do you think is causing the pains?"     Muscle  spasms 5. FEVER: "Have you been having fever?"     no 6. OTHER SYMPTOMS: "Do you have any other symptoms?" (e.g., chest pain, weakness, rash, cold or flu symptoms, weight loss)     Chest pain, neck pain,  7. PREGNANCY: "Is there any chance you are pregnant?" "When was your last menstrual period?"     n/a 8. TRAVEL: "Have you traveled out of the country in the last month?" (e.g., travel history, exposures)     no  Protocols used: MUSCLE ACHES AND BODY PAIN-A-AH, CHEST PAIN-A-AH

## 2018-03-21 NOTE — Telephone Encounter (Signed)
Noted, see other message

## 2018-03-21 NOTE — Progress Notes (Signed)
Received msg from [facility] (did not see under patient calls) per Lonell Grandchild.  Pt calling in with c/o left sided neck pain that radiates down left side/rib area.  Pain also radiates to left upper back/shoulder area.  Denies SOB, chest pain, nausea, No acute distress currently.  Not experiencing any sx at time of call but states that pain comes and goes; worse at night time.  Per Dr. Yong Channel, patient can be seen tomorrow-8/21.  Patient offered appt tomorrow and refused.  Appt scheduled for 8/23, Pt given strict ED precautions and advised to go directly to ED if develops any chest pain, SOB, nausea.  Pt verbalized understanding.

## 2018-03-21 NOTE — Telephone Encounter (Signed)
See note

## 2018-03-24 ENCOUNTER — Ambulatory Visit (INDEPENDENT_AMBULATORY_CARE_PROVIDER_SITE_OTHER): Payer: Medicare Other | Admitting: Family Medicine

## 2018-03-24 ENCOUNTER — Encounter: Payer: Self-pay | Admitting: Family Medicine

## 2018-03-24 VITALS — BP 124/70 | HR 76 | Temp 98.6°F | Ht 62.0 in | Wt 159.0 lb

## 2018-03-24 DIAGNOSIS — I517 Cardiomegaly: Secondary | ICD-10-CM | POA: Diagnosis not present

## 2018-03-24 DIAGNOSIS — R9431 Abnormal electrocardiogram [ECG] [EKG]: Secondary | ICD-10-CM | POA: Diagnosis not present

## 2018-03-24 DIAGNOSIS — Z1331 Encounter for screening for depression: Secondary | ICD-10-CM

## 2018-03-24 DIAGNOSIS — R079 Chest pain, unspecified: Secondary | ICD-10-CM | POA: Diagnosis not present

## 2018-03-24 DIAGNOSIS — Z23 Encounter for immunization: Secondary | ICD-10-CM | POA: Diagnosis not present

## 2018-03-24 NOTE — Addendum Note (Signed)
Addended by: Agnes Lawrence on: 03/24/2018 01:52 PM   Modules accepted: Orders

## 2018-03-24 NOTE — Progress Notes (Signed)
Subjective:  Sheilah Rayos is a 67 y.o. year old very pleasant female patient who presents for/with See problem oriented charting ROS-  no dizziness, lightheadedness, shortness of breath, nausea, abnormal diaphoresis  Past Medical History-  Patient Active Problem List   Diagnosis Date Noted  . IBS (irritable bowel syndrome) 07/08/2015    Priority: Medium  . Restless leg syndrome 07/05/2014    Priority: Medium  . Hyperlipidemia 05/28/2009    Priority: Medium  . BREAST CANCER, HX OF 03/11/2009    Priority: Medium  . Obesity (BMI 30.0-34.9) 12/08/2017    Priority: Low  . Greater trochanteric bursitis of left hip 05/10/2016    Priority: Low  . Sigmoid diverticulitis 07/08/2015    Priority: Low  . Hx of tobacco use, presenting hazards to health 07/08/2015    Priority: Low  . Wheezing 05/30/2013    Priority: Low  . Low back pain 08/03/2012    Priority: Low  . TRICUSPID REGURGITATION 05/28/2009    Priority: Low  . DEPRESSION 03/11/2009    Priority: Low  . Allergic rhinitis 03/11/2009    Priority: Low  . HEART MURMUR, HX OF 03/11/2009    Priority: Low  . History of herpes genitalis 03/11/2009    Priority: Low  . Osteopenia 01/19/2018    Medications- reviewed and updated Current Outpatient Medications  Medication Sig Dispense Refill  . aspirin EC 81 MG tablet Take 81 mg by mouth at bedtime.     Marland Kitchen atorvastatin (LIPITOR) 20 MG tablet TAKE ONE TABLET BY MOUTH TWICE A WEEK 36 tablet 3  . Carboxymethylcellul-Glycerin (LUBRICATING EYE DROPS OP) Apply 1 drop to eye as needed (dry eyes).     . cetirizine (ZYRTEC) 10 MG tablet Take 10 mg by mouth daily as needed for allergies.     . cholecalciferol (VITAMIN D) 1000 units tablet Take 1,000 Units by mouth 2 (two) times daily at 8 am and 10 pm.    . Coenzyme Q10 (CO Q-10) 100 MG CAPS Take by mouth daily.     . cyanocobalamin 1000 MCG tablet Take 1,000 mcg by mouth daily.    Marland Kitchen gabapentin (NEURONTIN) 100 MG capsule TAKE 1 TO 3 CAPSULES  BY MOUTH THREE TIMES DAILY 90 capsule 5  . ipratropium (ATROVENT) 0.06 % nasal spray Place 2 sprays into both nostrils 4 (four) times daily. 15 mL 0  . ketotifen (ZADITOR) 0.025 % ophthalmic solution Place 1 drop into both eyes daily.    . montelukast (SINGULAIR) 10 MG tablet Take 10 mg by mouth at bedtime.    . Multiple Vitamin (MULTIVITAMIN) tablet Take 1 tablet by mouth daily.      Marland Kitchen OVER THE COUNTER MEDICATION Caltrate    . triamcinolone (NASACORT AQ) 55 MCG/ACT AERO nasal inhaler Place 2 sprays into the nose daily. 1 Inhaler 12  . valACYclovir (VALTREX) 500 MG tablet Take 500 mg by mouth at bedtime.      No current facility-administered medications for this visit.     Objective: BP 124/70 (BP Location: Left Arm, Patient Position: Sitting, Cuff Size: Normal)   Pulse 76   Temp 98.6 F (37 C) (Oral)   Ht 5\' 2"  (1.575 m)   Wt 159 lb (72.1 kg)   SpO2 96%   BMI 29.08 kg/m  Gen: NAD, resting comfortably CV: RRR no murmurs rubs or gallops No pain with palpation over breast wall. Normal breast reconstruction noted. No bruising or ecchymosis. No lymphadenopathy in axilla Lungs: CTAB no crackles, wheeze, rhonchi Abdomen: soft/nontender/nondistended/normal bowel  sounds. No rebound or guarding.  Ext: no edema Skin: warm, dry Neuro: grossly normal, moves all extremities  EKG: sinus rhythm with rate 65, normal axis, normal intervals, LVH based on R1 and sIII >2.5 mv but no st or t wave chages  Assessment/Plan:  Chest pain, unspecified type - Plan: EKG 12-Lead S:This week on Monday- Patient complaieds of "muscle spasm" per her report in the left chest that is sharp and moderate intensity (felt like spasms she had after her implant surgery and this felt like that). Had tingling sensation. Started at lunch the first time. Pain over area of prior breast construction. Just 1 day of symptoms. Comes on for 10-15 minutes then goes away- about four episodes total ending on wedneday (2 on Monday, 1  on Tuesday, 1 on Wednesday). This pain is NOT exertional in naturea nd not relieved by rest. Most of the episodes happened at rest either eating or driving.   The prior week- She also has some left neck painand shoulder  and had tried ice packs. She states these symptoms resolved. Pain lasted until early this week   We asked her to be seen on day of calling in 8/20 but she declined- given ED precautions. Offered 8/21 as well but she declined until evaluation today. Luckily she has had no symptoms since wednesday  Patient states she does wonder if symptoms occur related to her bra which tends to curl up during the day- she is going to try a new bra at work.  A/P: 67 year old woman with history of hyperlipidemia on treatment, breast cancer on left side, former smoker but quit many years ago presenting with left sided chest pain at site of old breast cancer. I suspect this was either from her bra or msk in nature. Does not sound cardiac. With that being said with possible LVH on ekg we will get an echocardiogram and would consider stress test if any wall motion abnormalities. She agrees to this plan. She will return to see [place] if has new or worsening symptoms.   Lab/Order associations: Chest pain, unspecified type - Plan: EKG 12-Lead  LVH (left ventricular hypertrophy) - Plan: ECHOCARDIOGRAM COMPLETE  Screening for depression  Abnormal EKG - Plan: ECHOCARDIOGRAM COMPLETE  Return precautions advised.  Garret Reddish, MD

## 2018-03-24 NOTE — Patient Instructions (Addendum)
-  advise you call to  schedule a flu shot in the fall   -I suspect this pain was either from your bra or muscular in nature. Does not sound cardiac. With that being said with possible left side of heart enlargement on ekg so we will get an echocardiogram and would consider stress test depending on echocardiogram results.   -We will call you within two weeks about your referral for echocardiogram. If you do not hear within 3 weeks, give [place] a call.   -She will return to see [place] if has new or worsening symptoms.

## 2018-04-06 ENCOUNTER — Other Ambulatory Visit (HOSPITAL_COMMUNITY): Payer: TRICARE For Life (TFL)

## 2018-04-06 ENCOUNTER — Ambulatory Visit (INDEPENDENT_AMBULATORY_CARE_PROVIDER_SITE_OTHER): Payer: Medicare HMO | Admitting: Family Medicine

## 2018-04-06 ENCOUNTER — Encounter: Payer: Self-pay | Admitting: Family Medicine

## 2018-04-06 VITALS — BP 110/86 | HR 86 | Temp 98.8°F | Ht 62.0 in | Wt 157.6 lb

## 2018-04-06 DIAGNOSIS — J029 Acute pharyngitis, unspecified: Secondary | ICD-10-CM

## 2018-04-06 DIAGNOSIS — J301 Allergic rhinitis due to pollen: Secondary | ICD-10-CM

## 2018-04-06 DIAGNOSIS — J00 Acute nasopharyngitis [common cold]: Secondary | ICD-10-CM | POA: Diagnosis not present

## 2018-04-06 LAB — POCT RAPID STREP A (OFFICE): Rapid Strep A Screen: NEGATIVE

## 2018-04-06 NOTE — Patient Instructions (Signed)
Please switch back to xyzal in case allergy symptoms contributing but I think this is primarily a ...  Upper Respiratory infection History and exam today are suggestive of viral infection most likely due to upper respiratory infection. Symptomatic treatment with: delsym for cough  We discussed that we did not find any infection that had higher probability of being bacterial such as pneumonia or strep throat. We discussed signs that bacterial infection may have developed particularly fever or shortness of breath. Likely course of 1-2 weeks. Patient is contagious and advised good handwashing and consideration of mask If going to be in public places.   Finally, we reviewed reasons to return to care including if symptoms worsen or persist or new concerns arise- once again particularly shortness of breath or fever.

## 2018-04-06 NOTE — Progress Notes (Signed)
PCP: Marin Olp, MD  Subjective:  Nicole Dawson is a 67 y.o. year old very pleasant female patient who presents with Upper Respiratory infection symptoms including nasal scratchy throat, cough. Tuesday started with scratchy throat. Yesterday felt really fatigued. Took day off work yesterday. Started feeling some better today. Dry cough with some rattling sensation. No runny nose, watery itchy eyes.   Ran out of xyzal and when she started zyrtec symptoms seemed to worsen. Still on nasonex and singulair.  -sick contacts/travel/risks: denies flu exposure. 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 aspirin EC 81 MG tablet Take 81 mg by mouth at bedtime.     Marland Kitchen atorvastatin (LIPITOR) 20 MG tablet TAKE ONE TABLET BY MOUTH TWICE A WEEK 36 tablet 3  . Carboxymethylcellul-Glycerin (LUBRICATING EYE DROPS OP) Apply 1 drop to eye as needed (dry eyes).     . cetirizine (ZYRTEC) 10 MG tablet Take 10 mg by mouth daily as needed for allergies.     . cholecalciferol (VITAMIN D) 1000 units tablet Take 1,000 Units by mouth 2 (two) times daily at 8 am and 10 pm.    . Coenzyme Q10 (CO Q-10) 100 MG CAPS Take by mouth daily.     . cyanocobalamin 1000 MCG tablet Take 1,000 mcg by mouth daily.    Marland Kitchen gabapentin (NEURONTIN) 100 MG capsule TAKE 1 TO 3 CAPSULES BY MOUTH THREE TIMES DAILY 90 capsule 5  . ipratropium (ATROVENT) 0.06 % nasal spray Place 2 sprays into both nostrils 4 (four) times daily. 15 mL 0  . ketotifen (ZADITOR) 0.025 % ophthalmic solution Place 1 drop into both eyes daily.    . montelukast (SINGULAIR) 10 MG tablet Take 10 mg by mouth at bedtime.    . Multiple Vitamin (MULTIVITAMIN) tablet Take 1 tablet by mouth daily.      Marland Kitchen OVER THE COUNTER MEDICATION  Caltrate    . triamcinolone (NASACORT AQ) 55 MCG/ACT AERO nasal inhaler Place 2 sprays into the nose daily. 1 Inhaler 12  . valACYclovir (VALTREX) 500 MG tablet Take 500 mg by mouth at bedtime.      Objective: BP 110/86 (BP Location: Left Arm, Patient Position: Sitting, Cuff Size: Large)   Pulse 86   Temp 98.8 F (37.1 C) (Oral)   Ht 5\' 2"  (1.575 m)   Wt 157 lb 9.6 oz (71.5 kg)   SpO2 97%   BMI 28.83 kg/m  Gen: NAD, resting comfortably HEENT: Turbinates erythematous and edematous, TM normal, pharynx mildly erythematous with no tonsilar exudate or edema, no sinus tenderness CV: RRR no murmurs rubs or gallops Lungs: CTAB no crackles, wheeze, rhonchi Ext: no edema Skin: warm, dry, no rash Neuro: speech normal  Results for orders placed or performed in visit on 04/06/18 (from the past 24 hour(s))  POCT rapid strep A     Status: None   Collection Time: 04/06/18 10:44 AM  Result Value Ref Range   Rapid Strep A Screen Negative Negative    Assessment/Plan:  Please switch back to xyzal in case allergy symptoms contributing but I think this is primarily a ...  Upper Respiratory infection History and exam today are suggestive of viral infection most likely due to upper respiratory infection. Symptomatic treatment with: delsym for cough  We discussed that we  did not find any infection that had higher probability of being bacterial such as pneumonia or strep throat. We discussed signs that bacterial infection may have developed particularly fever or shortness of breath. Likely course of 1-2 weeks. Patient is contagious and advised good handwashing and consideration of mask If going to be in public places.   Finally, we reviewed reasons to return to care including if symptoms worsen or persist or new concerns arise- once again particularly shortness of breath or fever.  Garret Reddish, MD

## 2018-04-10 ENCOUNTER — Encounter: Payer: Self-pay | Admitting: Family Medicine

## 2018-04-10 ENCOUNTER — Ambulatory Visit (INDEPENDENT_AMBULATORY_CARE_PROVIDER_SITE_OTHER): Payer: Medicare HMO | Admitting: Family Medicine

## 2018-04-10 VITALS — BP 108/68 | HR 86 | Temp 98.2°F | Ht 62.0 in | Wt 157.8 lb

## 2018-04-10 DIAGNOSIS — J329 Chronic sinusitis, unspecified: Secondary | ICD-10-CM | POA: Diagnosis not present

## 2018-04-10 DIAGNOSIS — B9689 Other specified bacterial agents as the cause of diseases classified elsewhere: Secondary | ICD-10-CM | POA: Diagnosis not present

## 2018-04-10 MED ORDER — AMOXICILLIN-POT CLAVULANATE 875-125 MG PO TABS: 1.0000 | ORAL_TABLET | Freq: Two times a day (BID) | ORAL | 0 refills | Status: AC

## 2018-04-10 MED ORDER — HYDROCODONE-HOMATROPINE 5-1.5 MG/5ML PO SYRP: 5.0000 mL | ORAL_SOLUTION | Freq: Four times a day (QID) | ORAL | 0 refills | Status: DC | PRN

## 2018-04-10 NOTE — Progress Notes (Signed)
PCP: Marin Olp, MD  Subjective:  Nicole Dawson is a 67 y.o. year old very pleasant female patient who presents with sinusitis symptoms including nasal congestion, sinus tenderness, cough with postnasal drip.  7days of total symptoms- after she left last visit within 24 hours she had drastically worsen Frontal sinus pressure on left Some nosebleeds yesterday on nasocort Feels congested- not blowing out much rom nose- getting drainage down throat Clear sputum Symptoms worsening.  A lot of pressure with bending over in frontal sinuses.   ROS-denies fever, SOB, NVD  Pertinent Past Medical History-  Patient Active Problem List   Diagnosis Date Noted  . IBS (irritable bowel syndrome) 07/08/2015    Priority: Medium  . Restless leg syndrome 07/05/2014    Priority: Medium  . Hyperlipidemia 05/28/2009    Priority: Medium  . BREAST CANCER, HX OF 03/11/2009    Priority: Medium  . Obesity (BMI 30.0-34.9) 12/08/2017    Priority: Low  . Greater trochanteric bursitis of left hip 05/10/2016    Priority: Low  . Sigmoid diverticulitis 07/08/2015    Priority: Low  . Hx of tobacco use, presenting hazards to health 07/08/2015    Priority: Low  . Wheezing 05/30/2013    Priority: Low  . Low back pain 08/03/2012    Priority: Low  . TRICUSPID REGURGITATION 05/28/2009    Priority: Low  . DEPRESSION 03/11/2009    Priority: Low  . Allergic rhinitis 03/11/2009    Priority: Low  . HEART MURMUR, HX OF 03/11/2009    Priority: Low  . History of herpes genitalis 03/11/2009    Priority: Low  . Osteopenia 01/19/2018    Medications- reviewed  Current Outpatient Medications  Medication Sig Dispense Refill  . aspirin EC 81 MG tablet Take 81 mg by mouth at bedtime.     Marland Kitchen atorvastatin (LIPITOR) 20 MG tablet TAKE ONE TABLET BY MOUTH TWICE A WEEK 36 tablet 3  . Carboxymethylcellul-Glycerin (LUBRICATING EYE DROPS OP) Apply 1 drop to eye as needed (dry eyes).     . cetirizine (ZYRTEC) 10 MG  tablet Take 10 mg by mouth daily as needed for allergies.     . cholecalciferol (VITAMIN D) 1000 units tablet Take 1,000 Units by mouth 2 (two) times daily at 8 am and 10 pm.    . Coenzyme Q10 (CO Q-10) 100 MG CAPS Take by mouth daily.     . cyanocobalamin 1000 MCG tablet Take 1,000 mcg by mouth daily.    Marland Kitchen gabapentin (NEURONTIN) 100 MG capsule TAKE 1 TO 3 CAPSULES BY MOUTH THREE TIMES DAILY 90 capsule 5  . ipratropium (ATROVENT) 0.06 % nasal spray Place 2 sprays into both nostrils 4 (four) times daily. 15 mL 0  . ketotifen (ZADITOR) 0.025 % ophthalmic solution Place 1 drop into both eyes daily.    . montelukast (SINGULAIR) 10 MG tablet Take 10 mg by mouth at bedtime.    . Multiple Vitamin (MULTIVITAMIN) tablet Take 1 tablet by mouth daily.      Marland Kitchen OVER THE COUNTER MEDICATION Caltrate    . triamcinolone (NASACORT AQ) 55 MCG/ACT AERO nasal inhaler Place 2 sprays into the nose daily. 1 Inhaler 12  . valACYclovir (VALTREX) 500 MG tablet Take 500 mg by mouth at bedtime.     Marland Kitchen amoxicillin-clavulanate (AUGMENTIN) 875-125 MG tablet Take 1 tablet by mouth 2 (two) times daily for 7 days. 14 tablet 0  . HYDROcodone-homatropine (HYCODAN) 5-1.5 MG/5ML syrup Take 5 mLs by mouth every 6 (six) hours as  needed for cough (dont drive for 8 hours after taking). 120 mL 0   No current facility-administered medications for this visit.     Objective: BP 108/68 (BP Location: Left Arm, Patient Position: Sitting, Cuff Size: Large)   Pulse 86   Temp 98.2 F (36.8 C) (Oral)   Ht 5\' 2"  (1.575 m)   Wt 157 lb 12.8 oz (71.6 kg)   SpO2 95%   BMI 28.86 kg/m  Gen: appears fatigued, wearing mask HEENT: Turbinates erythematous with yellow drainage- some dried blood in right nare, TM normal, pharynx mildly erythematous with no tonsilar exudate or edema, left frontal sinus tenderness CV: RRR no murmurs rubs or gallops Lungs: CTAB no crackles, wheeze, rhonchi Ext: no edema Skin: warm, dry, no  rash   Assessment/Plan:  Sinsusitis Bacterial based on:  double sickening  Treatment: -considered steroid: we opted out due to concern for weight gain -other symptomatic care with  Hycodan cough syrup. We reviewed nccsrs and has received narcotic rx at times for respiratory symptoms but no other use. She thinks this would help her sleep better at night- post nasal drip and associated cough really bothering her.  -Antibiotic indicated: yes we discussed could start augmentin now- she would prefer to give it a few more days before starting which is reasonable- will print augmentin for her to use if not improving within next few days  Finally, we reviewed reasons to return to care including if symptoms worsen or persist or new concerns arise (particularly fever or shortness of breath)  Meds ordered this encounter  Medications  . HYDROcodone-homatropine (HYCODAN) 5-1.5 MG/5ML syrup    Sig: Take 5 mLs by mouth every 6 (six) hours as needed for cough (dont drive for 8 hours after taking).    Dispense:  120 mL    Refill:  0  . amoxicillin-clavulanate (AUGMENTIN) 875-125 MG tablet    Sig: Take 1 tablet by mouth 2 (two) times daily for 7 days.    Dispense:  14 tablet    Refill:  0    Garret Reddish, MD

## 2018-04-10 NOTE — Patient Instructions (Signed)
Sinsusitis Bacterial based on:  double sickening  Treatment: -considered steroid: we opted out due to concern for weight gain -other symptomatic care with  Hycodan cough syrup -Antibiotic indicated: yes we discussed could start augmentin now- she would prefer to give it a few more days before starting which is reasonable- will print augmentin for her to use if not improving within next few days  Finally, we reviewed reasons to return to care including if symptoms worsen or persist or new concerns arise (particularly fever or shortness of breath)  Meds ordered this encounter  Medications  . HYDROcodone-homatropine (HYCODAN) 5-1.5 MG/5ML syrup    Sig: Take 5 mLs by mouth every 6 (six) hours as needed for cough (dont drive for 8 hours after taking).    Dispense:  120 mL    Refill:  0  . amoxicillin-clavulanate (AUGMENTIN) 875-125 MG tablet    Sig: Take 1 tablet by mouth 2 (two) times daily for 7 days.    Dispense:  14 tablet    Refill:  0

## 2018-04-19 ENCOUNTER — Other Ambulatory Visit: Payer: Self-pay

## 2018-04-19 ENCOUNTER — Ambulatory Visit (HOSPITAL_COMMUNITY): Payer: Medicare HMO | Attending: Internal Medicine

## 2018-04-19 DIAGNOSIS — Z87891 Personal history of nicotine dependence: Secondary | ICD-10-CM | POA: Diagnosis not present

## 2018-04-19 DIAGNOSIS — E669 Obesity, unspecified: Secondary | ICD-10-CM | POA: Insufficient documentation

## 2018-04-19 DIAGNOSIS — R9431 Abnormal electrocardiogram [ECG] [EKG]: Secondary | ICD-10-CM | POA: Diagnosis not present

## 2018-04-19 DIAGNOSIS — Z853 Personal history of malignant neoplasm of breast: Secondary | ICD-10-CM | POA: Diagnosis not present

## 2018-04-19 DIAGNOSIS — E785 Hyperlipidemia, unspecified: Secondary | ICD-10-CM | POA: Diagnosis not present

## 2018-04-19 DIAGNOSIS — R011 Cardiac murmur, unspecified: Secondary | ICD-10-CM | POA: Diagnosis not present

## 2018-04-19 DIAGNOSIS — I517 Cardiomegaly: Secondary | ICD-10-CM

## 2018-04-19 DIAGNOSIS — Z6828 Body mass index (BMI) 28.0-28.9, adult: Secondary | ICD-10-CM | POA: Diagnosis not present

## 2018-04-19 DIAGNOSIS — Z9882 Breast implant status: Secondary | ICD-10-CM | POA: Insufficient documentation

## 2018-04-27 ENCOUNTER — Other Ambulatory Visit: Payer: Self-pay | Admitting: Family Medicine

## 2018-05-01 DIAGNOSIS — Z9882 Breast implant status: Secondary | ICD-10-CM | POA: Diagnosis not present

## 2018-05-25 ENCOUNTER — Ambulatory Visit (INDEPENDENT_AMBULATORY_CARE_PROVIDER_SITE_OTHER): Payer: Medicare HMO

## 2018-05-25 DIAGNOSIS — Z23 Encounter for immunization: Secondary | ICD-10-CM

## 2018-05-29 DIAGNOSIS — Z23 Encounter for immunization: Secondary | ICD-10-CM

## 2018-08-17 ENCOUNTER — Encounter: Payer: Self-pay | Admitting: Family Medicine

## 2018-08-17 ENCOUNTER — Ambulatory Visit (INDEPENDENT_AMBULATORY_CARE_PROVIDER_SITE_OTHER): Payer: Medicare HMO | Admitting: Family Medicine

## 2018-08-17 VITALS — BP 116/68 | HR 77 | Temp 98.2°F | Ht 62.0 in | Wt 159.0 lb

## 2018-08-17 DIAGNOSIS — B9789 Other viral agents as the cause of diseases classified elsewhere: Secondary | ICD-10-CM

## 2018-08-17 DIAGNOSIS — J329 Chronic sinusitis, unspecified: Secondary | ICD-10-CM | POA: Diagnosis not present

## 2018-08-17 DIAGNOSIS — J069 Acute upper respiratory infection, unspecified: Secondary | ICD-10-CM | POA: Diagnosis not present

## 2018-08-17 DIAGNOSIS — Z79899 Other long term (current) drug therapy: Secondary | ICD-10-CM | POA: Diagnosis not present

## 2018-08-17 MED ORDER — BENZONATATE 100 MG PO CAPS: 100.0000 mg | ORAL_CAPSULE | Freq: Three times a day (TID) | ORAL | 0 refills | Status: AC | PRN

## 2018-08-17 MED ORDER — PROMETHAZINE-CODEINE 6.25-10 MG/5ML PO SYRP: 5.0000 mL | ORAL_SOLUTION | Freq: Four times a day (QID) | ORAL | 0 refills | Status: DC | PRN

## 2018-08-17 NOTE — Progress Notes (Signed)
PCP: Marin Olp, MD  Subjective:  Nicole Dawson is a 68 y.o. year old very pleasant female patient who presents with Upper Respiratory infection symptoms including nasal congestion, sore throat, cough   Monday woke up with sore throat- thought related to her snoring But also felt run down Tuesday still with sore throat and feeling worn down Started with cough on Tuesday Then Wednesday with worsening sore throat Started coughing more Getting some thick clear discharge from nose as well Thinks exposed at work Denies shortness of breath and has no chest pain Nasal congestion but no chest congestion Rattling to the cough. Primarily dry cough.  Delsym helping some.   -sick contacts/travel/risks: denies flu exposure.  Has been around several sick patients though  ROS-denies fever, SOB, NVD, tooth pain.  No wheezing reported  Pertinent Past Medical History-  Patient Active Problem List   Diagnosis Date Noted  . IBS (irritable bowel syndrome) 07/08/2015    Priority: Medium  . Restless leg syndrome 07/05/2014    Priority: Medium  . Hyperlipidemia 05/28/2009    Priority: Medium  . BREAST CANCER, HX OF 03/11/2009    Priority: Medium  . Obesity (BMI 30.0-34.9) 12/08/2017    Priority: Low  . Greater trochanteric bursitis of left hip 05/10/2016    Priority: Low  . Sigmoid diverticulitis 07/08/2015    Priority: Low  . Hx of tobacco use, presenting hazards to health 07/08/2015    Priority: Low  . Wheezing 05/30/2013    Priority: Low  . Low back pain 08/03/2012    Priority: Low  . TRICUSPID REGURGITATION 05/28/2009    Priority: Low  . DEPRESSION 03/11/2009    Priority: Low  . Allergic rhinitis 03/11/2009    Priority: Low  . HEART MURMUR, HX OF 03/11/2009    Priority: Low  . History of herpes genitalis 03/11/2009    Priority: Low  . Osteopenia 01/19/2018   Medications- reviewed  Current Outpatient Medications  Medication Sig Dispense Refill  . aspirin EC 81 MG  tablet Take 81 mg by mouth at bedtime.     Marland Kitchen atorvastatin (LIPITOR) 20 MG tablet TAKE ONE TABLET BY MOUTH TWICE A WEEK 36 tablet 3  . Biotin 10000 MCG TABS Take 1 tablet by mouth daily.    . Carboxymethylcellul-Glycerin (LUBRICATING EYE DROPS OP) Apply 1 drop to eye as needed (dry eyes).     . cetirizine (ZYRTEC) 10 MG tablet Take 10 mg by mouth daily as needed for allergies.     . cholecalciferol (VITAMIN D) 1000 units tablet Take 1,000 Units by mouth 2 (two) times daily at 8 am and 10 pm.    . Coenzyme Q10 (CO Q-10) 100 MG CAPS Take by mouth daily.     . cyanocobalamin 1000 MCG tablet Take 1,000 mcg by mouth daily.    Marland Kitchen gabapentin (NEURONTIN) 100 MG capsule TAKE 1 TO 3 CAPSULES BY MOUTH THREE TIMES DAILY 90 capsule 5  . ipratropium (ATROVENT) 0.06 % nasal spray Place 2 sprays into both nostrils 4 (four) times daily. 15 mL 0  . ketotifen (ZADITOR) 0.025 % ophthalmic solution Place 1 drop into both eyes daily.    . montelukast (SINGULAIR) 10 MG tablet Take 10 mg by mouth at bedtime.    . Multiple Vitamin (MULTIVITAMIN) tablet Take 1 tablet by mouth daily.      Marland Kitchen OVER THE COUNTER MEDICATION Caltrate    . triamcinolone (NASACORT AQ) 55 MCG/ACT AERO nasal inhaler Place 2 sprays into the nose daily. 1  Inhaler 12  . valACYclovir (VALTREX) 500 MG tablet Take 500 mg by mouth at bedtime.     . benzonatate (TESSALON) 100 MG capsule Take 1 capsule (100 mg total) by mouth 3 (three) times daily as needed for up to 20 days for cough. 30 capsule 0  . promethazine-codeine (PHENERGAN WITH CODEINE) 6.25-10 MG/5ML syrup Take 5 mLs by mouth every 6 (six) hours as needed for cough. 120 mL 0   No current facility-administered medications for this visit.     Objective: BP 116/68 (BP Location: Left Arm, Patient Position: Sitting, Cuff Size: Large)   Pulse 77   Temp 98.2 F (36.8 C) (Oral)   Ht 5\' 2"  (1.575 m)   Wt 159 lb (72.1 kg)   SpO2 97%   BMI 29.08 kg/m  Gen: NAD, resting comfortably HEENT: Turbinates  erythematous with clear discharge, TM normal, pharynx mildly erythematous with no tonsilar exudate or edema, frontal sinus tenderness noted CV: RRR no murmurs rubs or gallops Lungs: CTAB no crackles, wheeze, rhonchi Ext: no edema Skin: warm, dry, no rash  Assessment/Plan:  Upper Respiratory infection History and exam today are suggestive of viral infection most likely due to upper respiratory infection. Symptomatic treatment with: Tessalon Perles in the daytime.  Can trial codeine cough syrup with Phenergan at nighttime or if she is going to be home for at least 6 hours-should not drive for 6 hours after taking medication.  She does have some sinus pressure- likely has viral sinusitis as well-we discussed if sinus symptoms last over 10 days or if she has worsening of symptoms after they begin to improve-that we could call in a antibiotic for her such as Augmentin  We discussed that we did not find any infection that had higher probability of being bacterial such as pneumonia or strep throat. We discussed signs that bacterial infection may have developed particularly fever or shortness of breath. Likely course of 1-2 weeks. advised good handwashing and should cough into elbow each time.  Finally, we reviewed reasons to return to care including if symptoms worsen or persist or new concerns arise- once again particularly shortness of breath or fever.  Meds ordered this encounter  Medications  . promethazine-codeine (PHENERGAN WITH CODEINE) 6.25-10 MG/5ML syrup    Sig: Take 5 mLs by mouth every 6 (six) hours as needed for cough.    Dispense:  120 mL    Refill:  0  . benzonatate (TESSALON) 100 MG capsule    Sig: Take 1 capsule (100 mg total) by mouth 3 (three) times daily as needed for up to 20 days for cough.    Dispense:  30 capsule    Refill:  0   Garret Reddish, MD

## 2018-08-17 NOTE — Patient Instructions (Signed)
Upper Respiratory infection History and exam today are suggestive of viral infection most likely due to upper respiratory infection. Symptomatic treatment with: Tessalon Perles in the daytime.  Can trial codeine cough syrup with Phenergan at nighttime or if she is going to be home for at least 6 hours-should not drive for 6 hours after taking medication.  She does have some sinus pressure- likely has viral sinusitis as well-we discussed if sinus symptoms last over 10 days or if she has worsening of symptoms after they begin to improve-that we could call in a antibiotic for her such as Augmentin  We discussed that we did not find any infection that had higher probability of being bacterial such as pneumonia or strep throat. We discussed signs that bacterial infection may have developed particularly fever or shortness of breath. Likely course of 1-2 weeks. advised good handwashing and should cough into elbow each time.  Finally, we reviewed reasons to return to care including if symptoms worsen or persist or new concerns arise- once again particularly shortness of breath or fever.

## 2018-08-18 ENCOUNTER — Telehealth: Payer: Self-pay | Admitting: Family Medicine

## 2018-08-18 NOTE — Telephone Encounter (Signed)
Copied from [place]. Topic: General - Other >> Aug 18, 2018 10:00 AM Sheran Luz wrote: Reason for CRM: Patient was seen yesterday and would like to know if she can get a note excusing her from work today 1/17 as she did not feel well enough to make it into work. Please advise.

## 2018-08-18 NOTE — Telephone Encounter (Signed)
Work Note provided as requested. Husband picked up

## 2018-08-21 ENCOUNTER — Ambulatory Visit (INDEPENDENT_AMBULATORY_CARE_PROVIDER_SITE_OTHER): Payer: Medicare HMO | Admitting: Family Medicine

## 2018-08-21 ENCOUNTER — Encounter: Payer: Self-pay | Admitting: Family Medicine

## 2018-08-21 VITALS — BP 114/72 | HR 81 | Temp 97.5°F | Ht 62.0 in | Wt 157.0 lb

## 2018-08-21 DIAGNOSIS — B9689 Other specified bacterial agents as the cause of diseases classified elsewhere: Secondary | ICD-10-CM | POA: Diagnosis not present

## 2018-08-21 DIAGNOSIS — J329 Chronic sinusitis, unspecified: Secondary | ICD-10-CM | POA: Diagnosis not present

## 2018-08-21 MED ORDER — AMOXICILLIN-POT CLAVULANATE 875-125 MG PO TABS: 1.0000 | ORAL_TABLET | Freq: Two times a day (BID) | ORAL | 0 refills | Status: AC

## 2018-08-21 NOTE — Patient Instructions (Addendum)
Bacterial sinusitis based on double sickening-Unfortunately her sinus symptoms are worsening instead of improving with time and conservative treatment.  This likely represents a bacterial superinfection on top of baseline virus.  We will treat with Augmentin for 7 days.  If she fails to improve or symptoms worsen I asked her to return to see us-particularly for fever or shortness of breath

## 2018-08-21 NOTE — Progress Notes (Signed)
Subjective:  Nicole Dawson is a 68 y.o. year old very pleasant female patient who presents for/with See problem oriented charting ROS- no fever, chills, nausea, vomiting, shortness of breath. No wheezing.    Past Medical History-  Patient Active Problem List   Diagnosis Date Noted  . IBS (irritable bowel syndrome) 07/08/2015    Priority: Medium  . Restless leg syndrome 07/05/2014    Priority: Medium  . Hyperlipidemia 05/28/2009    Priority: Medium  . BREAST CANCER, HX OF 03/11/2009    Priority: Medium  . Obesity (BMI 30.0-34.9) 12/08/2017    Priority: Low  . Greater trochanteric bursitis of left hip 05/10/2016    Priority: Low  . Sigmoid diverticulitis 07/08/2015    Priority: Low  . Hx of tobacco use, presenting hazards to health 07/08/2015    Priority: Low  . Wheezing 05/30/2013    Priority: Low  . Low back pain 08/03/2012    Priority: Low  . TRICUSPID REGURGITATION 05/28/2009    Priority: Low  . DEPRESSION 03/11/2009    Priority: Low  . Allergic rhinitis 03/11/2009    Priority: Low  . HEART MURMUR, HX OF 03/11/2009    Priority: Low  . History of herpes genitalis 03/11/2009    Priority: Low  . Osteopenia 01/19/2018    Medications- reviewed and updated Current Outpatient Medications  Medication Sig Dispense Refill  . aspirin EC 81 MG tablet Take 81 mg by mouth at bedtime.     Marland Kitchen atorvastatin (LIPITOR) 20 MG tablet TAKE ONE TABLET BY MOUTH TWICE A WEEK 36 tablet 3  . Biotin 10000 MCG TABS Take 1 tablet by mouth daily.    . Carboxymethylcellul-Glycerin (LUBRICATING EYE DROPS OP) Apply 1 drop to eye as needed (dry eyes).     . cetirizine (ZYRTEC) 10 MG tablet Take 10 mg by mouth daily as needed for allergies.     . cholecalciferol (VITAMIN D) 1000 units tablet Take 1,000 Units by mouth 2 (two) times daily at 8 am and 10 pm.    . Coenzyme Q10 (CO Q-10) 100 MG CAPS Take by mouth daily.     . cyanocobalamin 1000 MCG tablet Take 1,000 mcg by mouth daily.    Marland Kitchen gabapentin  (NEURONTIN) 100 MG capsule TAKE 1 TO 3 CAPSULES BY MOUTH THREE TIMES DAILY 90 capsule 5  . ipratropium (ATROVENT) 0.06 % nasal spray Place 2 sprays into both nostrils 4 (four) times daily. 15 mL 0  . ketotifen (ZADITOR) 0.025 % ophthalmic solution Place 1 drop into both eyes daily.    . montelukast (SINGULAIR) 10 MG tablet Take 10 mg by mouth at bedtime.    . Multiple Vitamin (MULTIVITAMIN) tablet Take 1 tablet by mouth daily.      Marland Kitchen OVER THE COUNTER MEDICATION Caltrate    . promethazine-codeine (PHENERGAN WITH CODEINE) 6.25-10 MG/5ML syrup Take 5 mLs by mouth every 6 (six) hours as needed for cough. 120 mL 0  . triamcinolone (NASACORT AQ) 55 MCG/ACT AERO nasal inhaler Place 2 sprays into the nose daily. 1 Inhaler 12  . valACYclovir (VALTREX) 500 MG tablet Take 500 mg by mouth at bedtime.     Marland Kitchen amoxicillin-clavulanate (AUGMENTIN) 875-125 MG tablet Take 1 tablet by mouth 2 (two) times daily for 7 days. 14 tablet 0  . benzonatate (TESSALON) 100 MG capsule Take 1 capsule (100 mg total) by mouth 3 (three) times daily as needed for up to 20 days for cough. (Patient not taking: Reported on 08/21/2018) 30 capsule 0  No current facility-administered medications for this visit.     Objective: BP 114/72 (BP Location: Left Arm, Patient Position: Sitting, Cuff Size: Large)   Pulse 81   Temp (!) 97.5 F (36.4 C) (Oral)   Ht 5\' 2"  (1.575 m)   Wt 157 lb (71.2 kg)   SpO2 97%   BMI 28.72 kg/m  Gen: NAD, appears fatigued Nasal turbinates erythematous with yellow discharge noted.  Pharynx with moderate erythema without tonsillar enlargement or exudate.  Bilateral maxillary and frontal sinus tenderness CV: RRR no murmurs rubs or gallops Lungs: CTAB no crackles, wheeze, rhonchi Ext: no edema Skin: warm, dry  Assessment/Plan:   Bacterial sinusitis  S: Patient was seen on the 16th with a sore throat, cough, thick clear discharge from nose, rattling with cough-diagnosed as viral URI.  Trial of codeine  cough syrup with Phenergan at nighttime as well as Tessalon Perles for the daytime.  She was having some sinus pressure and we discussed possibility of viral sinusitis at that time.  Today marks day 8 of illness. 2 days ago noted yellow discharge instead of clear discharge from sinuses.  Now with pressure in maxillary sinuses as well as frontal sinuses.  Notes some blood tinged from nasal discharge. Had tried nasal steroid. Still with sore throat and didn't feel like she could go to work today. Frontal sinus pressure stable and not improving. No fever. Cough medicines were helpful- able to space out now as cough has improved some.  She feels more fatigued. A/P:Bacterial sinusitis based on double sickening-Unfortunately her sinus symptoms are worsening instead of improving with time and conservative treatment.  This likely represents a bacterial superinfection on top of baseline virus.  We will treat with Augmentin for 7 days.  If she fails to improve or symptoms worsen I asked her to return to see us-particularly for fever or shortness of breath  Meds ordered this encounter  Medications  . amoxicillin-clavulanate (AUGMENTIN) 875-125 MG tablet    Sig: Take 1 tablet by mouth 2 (two) times daily for 7 days.    Dispense:  14 tablet    Refill:  0   Return precautions advised.  Garret Reddish, MD

## 2018-09-11 DIAGNOSIS — J3089 Other allergic rhinitis: Secondary | ICD-10-CM | POA: Diagnosis not present

## 2018-09-18 ENCOUNTER — Encounter: Payer: Self-pay | Admitting: Family Medicine

## 2018-09-18 ENCOUNTER — Ambulatory Visit (INDEPENDENT_AMBULATORY_CARE_PROVIDER_SITE_OTHER): Payer: Medicare HMO | Admitting: Family Medicine

## 2018-09-18 VITALS — BP 124/68 | HR 80 | Temp 98.7°F | Ht 62.0 in | Wt 157.8 lb

## 2018-09-18 DIAGNOSIS — J069 Acute upper respiratory infection, unspecified: Secondary | ICD-10-CM | POA: Diagnosis not present

## 2018-09-18 DIAGNOSIS — Z79899 Other long term (current) drug therapy: Secondary | ICD-10-CM

## 2018-09-18 MED ORDER — PROMETHAZINE-CODEINE 6.25-10 MG/5ML PO SYRP: 5.0000 mL | ORAL_SOLUTION | Freq: Four times a day (QID) | ORAL | 0 refills | Status: DC | PRN

## 2018-09-18 NOTE — Progress Notes (Signed)
PCP: Marin Olp, MD  Subjective:  Nicole Dawson is a 68 y.o. year old very pleasant female patient who presents with Upper Respiratory infection symptoms including nasal congestion, sore throat, cough   Treated about 3-4 weeks ago for sinusitis- sinuses seemed to clear.  Then Wednesday of last week started with sore throat Day 6 today Then developed worsening cough- mainly dry.  Seemed to lose voice yesterday.  Continued singulair, zyrtec, nasacort.  Still has some atrovent- using sparingly  No shortness of breath. No fever.  Symptoms largely stable Multiple potential sick contacts-works as a CNA at [employer] No known flu exposure  ROS-denies fever, SOB, NVD, tooth pain  Pertinent Past Medical History-  Patient Active Problem List   Diagnosis Date Noted  . IBS (irritable bowel syndrome) 07/08/2015    Priority: Medium  . Restless leg syndrome 07/05/2014    Priority: Medium  . Hyperlipidemia 05/28/2009    Priority: Medium  . BREAST CANCER, HX OF 03/11/2009    Priority: Medium  . Obesity (BMI 30.0-34.9) 12/08/2017    Priority: Low  . Greater trochanteric bursitis of left hip 05/10/2016    Priority: Low  . Sigmoid diverticulitis 07/08/2015    Priority: Low  . Hx of tobacco use, presenting hazards to health 07/08/2015    Priority: Low  . Wheezing 05/30/2013    Priority: Low  . Low back pain 08/03/2012    Priority: Low  . TRICUSPID REGURGITATION 05/28/2009    Priority: Low  . DEPRESSION 03/11/2009    Priority: Low  . Allergic rhinitis 03/11/2009    Priority: Low  . HEART MURMUR, HX OF 03/11/2009    Priority: Low  . History of herpes genitalis 03/11/2009    Priority: Low  . Osteopenia 01/19/2018    Medications- reviewed  Current Outpatient Medications  Medication Sig Dispense Refill  . aspirin EC 81 MG tablet Take 81 mg by mouth at bedtime.     Marland Kitchen atorvastatin (LIPITOR) 20 MG tablet TAKE ONE TABLET BY MOUTH TWICE A WEEK 36 tablet 3  . Biotin 10000 MCG  TABS Take 1 tablet by mouth daily.    . Carboxymethylcellul-Glycerin (LUBRICATING EYE DROPS OP) Apply 1 drop to eye as needed (dry eyes).     . cetirizine (ZYRTEC) 10 MG tablet Take 10 mg by mouth daily as needed for allergies.     . cholecalciferol (VITAMIN D) 1000 units tablet Take 1,000 Units by mouth 2 (two) times daily at 8 am and 10 pm.    . Coenzyme Q10 (CO Q-10) 100 MG CAPS Take by mouth daily.     . cyanocobalamin 1000 MCG tablet Take 1,000 mcg by mouth daily.    Marland Kitchen gabapentin (NEURONTIN) 100 MG capsule TAKE 1 TO 3 CAPSULES BY MOUTH THREE TIMES DAILY 90 capsule 5  . ipratropium (ATROVENT) 0.06 % nasal spray Place 2 sprays into both nostrils 4 (four) times daily. 15 mL 0  . ketotifen (ZADITOR) 0.025 % ophthalmic solution Place 1 drop into both eyes daily.    . montelukast (SINGULAIR) 10 MG tablet Take 10 mg by mouth at bedtime.    . Multiple Vitamin (MULTIVITAMIN) tablet Take 1 tablet by mouth daily.      Marland Kitchen OVER THE COUNTER MEDICATION Caltrate    . promethazine-codeine (PHENERGAN WITH CODEINE) 6.25-10 MG/5ML syrup Take 5 mLs by mouth every 6 (six) hours as needed for cough (dont drive for 8 hour after taking). 120 mL 0  . triamcinolone (NASACORT AQ) 55 MCG/ACT AERO nasal inhaler  Place 2 sprays into the nose daily. 1 Inhaler 12  . valACYclovir (VALTREX) 500 MG tablet Take 500 mg by mouth at bedtime.      No current facility-administered medications for this visit.     Objective: BP 124/68   Pulse 80   Temp 98.7 F (37.1 C) (Oral)   Ht 5\' 2"  (1.575 m)   Wt 157 lb 12.8 oz (71.6 kg)   SpO2 98%   BMI 28.86 kg/m  Gen: NAD, resting comfortably HEENT: Turbinates erythematous, TM normal, pharynx mildly erythematous with no tonsilar exudate or edema, no sinus tenderness CV: RRR no murmurs rubs or gallops Lungs: CTAB no crackles, wheeze, rhonchi Abdomen: soft/nontender/nondistended/normal bowel sounds. No rebound or guarding.  Ext: no edema Skin: warm, dry, no rash Neuro: grossly  normal, moves all extremities  Assessment/Plan:  Upper Respiratory infection History and exam today are suggestive of viral infection most likely due to upper respiratory infection. 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  We discussed that we did not find any infection that had higher probability of being bacterial such as pneumonia or strep throat. We discussed signs that bacterial infection may have developed particularly fever or shortness of breath. Likely course of 2 weeks. Patient is contagious and advised good handwashing and consideration of mask If going to be in public places. Can wear mask at work when you get back if having lingering cough  Finally, we reviewed reasons to return to care including if symptoms worsen or persist or new concerns arise- once again particularly shortness of breath or fever or symptoms over 2 weeks  Meds ordered this encounter  Medications  . promethazine-codeine (PHENERGAN WITH CODEINE) 6.25-10 MG/5ML syrup    Sig: Take 5 mLs by mouth every 6 (six) hours as needed for cough (dont drive for 8 hour after taking).    Dispense:  120 mL    Refill:  0  Prior bacterial sinusitis has fully resolved.  This is a new acute condition.  Patient has failed Tessalon Perles in the past- request for Phenergan/codeine which has been used in the past.  She actually asked about Hycodan or Cheratussin- we reviewed since these are stronger narcotics we wanted to avoid their use-she is understanding  Garret Reddish, MD

## 2018-09-18 NOTE — Patient Instructions (Signed)
Upper Respiratory infection History and exam today are suggestive of viral infection most likely due to upper respiratory infection. Symptomatic treatment with: rest- take 2 days off of work to help you heal up, cough  We discussed that we did not find any infection that had higher probability of being bacterial such as pneumonia or strep throat. We discussed signs that bacterial infection may have developed particularly fever or shortness of breath. Likely course of 2 weeks. Patient is contagious and advised good handwashing and consideration of mask If going to be in public places. Can wear mask at work when you get back if having lingering cough  Finally, we reviewed reasons to return to care including if symptoms worsen or persist or new concerns arise- once again particularly shortness of breath or fever or symptoms over 2 weeks

## 2018-10-17 ENCOUNTER — Other Ambulatory Visit: Payer: Self-pay | Admitting: Family Medicine

## 2018-11-16 ENCOUNTER — Other Ambulatory Visit: Payer: Self-pay | Admitting: Family Medicine

## 2018-11-17 ENCOUNTER — Telehealth: Payer: Self-pay | Admitting: *Deleted

## 2018-11-17 NOTE — Telephone Encounter (Signed)
Left message on voicemail to call office. Pt needs to schedule virtual follow up for medication refill for cholesterol.

## 2018-11-20 ENCOUNTER — Encounter: Payer: Self-pay | Admitting: Family Medicine

## 2018-11-20 ENCOUNTER — Ambulatory Visit (INDEPENDENT_AMBULATORY_CARE_PROVIDER_SITE_OTHER): Payer: Medicare HMO | Admitting: Family Medicine

## 2018-11-20 VITALS — HR 75

## 2018-11-20 DIAGNOSIS — G2581 Restless legs syndrome: Secondary | ICD-10-CM | POA: Diagnosis not present

## 2018-11-20 DIAGNOSIS — E785 Hyperlipidemia, unspecified: Secondary | ICD-10-CM | POA: Diagnosis not present

## 2018-11-20 MED ORDER — ATORVASTATIN CALCIUM 20 MG PO TABS: ORAL_TABLET | ORAL | 3 refills | Status: DC

## 2018-11-20 MED ORDER — GABAPENTIN 100 MG PO CAPS: ORAL_CAPSULE | ORAL | 3 refills | Status: DC

## 2018-11-20 NOTE — Patient Instructions (Signed)
Video visit

## 2018-11-20 NOTE — Progress Notes (Signed)
Phone [phone]   Subjective:  Virtual visit via Video note. Chief complaint: Chief Complaint  Patient presents with   Follow-up    This visit type was conducted due to national recommendations for restrictions regarding the COVID-19 Pandemic (e.g. social distancing).  This format is felt to be most appropriate for this patient at this time balancing risks to patient and risks to population by having him in for in person visit.  No physical exam was performed (except for noted visual exam or audio findings with Telehealth visits).    Our team/I connected with Nicole Dawson on 11/20/18 at  2:20 PM EDT by a video enabled telemedicine application ([email or web address]) and verified that I am speaking with the correct person using two identifiers.  Location patient: [facility] Location provider: [facility], office Persons participating in the virtual visit:  patient  Our team/I discussed the limitations of evaluation and management by telemedicine and the availability of in person appointments. In light of current covid-19 pandemic, patient also understands that we are trying to protect them by minimizing in office contact if at all possible.  The patient expressed consent for telemedicine visit and agreed to proceed. Patient understands insurance will be billed.   ROS- No chest pain or shortness of breath. No headache or blurry vision.    Past Medical History-  Patient Active Problem List   Diagnosis Date Noted   IBS (irritable bowel syndrome) 07/08/2015    Priority: Medium   Restless leg syndrome 07/05/2014    Priority: Medium   Hyperlipidemia 05/28/2009    Priority: Medium   BREAST CANCER, HX OF 03/11/2009    Priority: Medium   Obesity (BMI 30.0-34.9) 12/08/2017    Priority: Low   Greater trochanteric bursitis of left hip 05/10/2016    Priority: Low   Sigmoid diverticulitis 07/08/2015    Priority: Low   Hx of tobacco use, presenting hazards to health 07/08/2015    Priority:  Low   Wheezing 05/30/2013    Priority: Low   Low back pain 08/03/2012    Priority: Low   TRICUSPID REGURGITATION 05/28/2009    Priority: Low   DEPRESSION 03/11/2009    Priority: Low   Allergic rhinitis 03/11/2009    Priority: [place], HX OF 03/11/2009    Priority: Low   History of herpes genitalis 03/11/2009    Priority: Low   Osteopenia 01/19/2018    Medications- reviewed and updated Current Outpatient Medications  Medication Sig Dispense Refill   aspirin EC 81 MG tablet Take 81 mg by mouth at bedtime.      atorvastatin (LIPITOR) 20 MG tablet TAKE 1 TABLET BY MOUTH TWICE A WEEK 19 tablet 0   Biotin 10000 MCG TABS Take 1 tablet by mouth daily.     Carboxymethylcellul-Glycerin (LUBRICATING EYE DROPS OP) Apply 1 drop to eye as needed (dry eyes).      cetirizine (ZYRTEC) 10 MG tablet Take 10 mg by mouth daily as needed for allergies.      cholecalciferol (VITAMIN D) 1000 units tablet Take 1,000 Units by mouth 2 (two) times daily at 8 am and 10 pm.     Coenzyme Q10 (CO Q-10) 100 MG CAPS Take by mouth daily.      cyanocobalamin 1000 MCG tablet Take 1,000 mcg by mouth daily.     gabapentin (NEURONTIN) 100 MG capsule TAKE 1 TO 3 CAPSULES BY MOUTH THREE TIMES DAILY 90 capsule 0   ipratropium (ATROVENT) 0.06 % nasal spray Place 2 sprays into  both nostrils 4 (four) times daily. 15 mL 0   ketotifen (ZADITOR) 0.025 % ophthalmic solution Place 1 drop into both eyes daily.     montelukast (SINGULAIR) 10 MG tablet Take 10 mg by mouth at bedtime.     Multiple Vitamin (MULTIVITAMIN) tablet Take 1 tablet by mouth daily.       OVER THE COUNTER MEDICATION Caltrate     promethazine-codeine (PHENERGAN WITH CODEINE) 6.25-10 MG/5ML syrup Take 5 mLs by mouth every 6 (six) hours as needed for cough (dont drive for 8 hour after taking). 120 mL 0   triamcinolone (NASACORT AQ) 55 MCG/ACT AERO nasal inhaler Place 2 sprays into the nose daily. 1 Inhaler 12   valACYclovir  (VALTREX) 500 MG tablet Take 500 mg by mouth at bedtime.      No current facility-administered medications for this visit.      Objective:  Pulse 75  Gen: NAD, resting comfortably Lungs: nonlabored, normal respiratory rate  Skin: appears dry, no obvious rash     Assessment and Plan    #hyperlipidemia S: Reasonably controlled on atorvastatin 20 mg twice a week Lab Results  Component Value Date   CHOL 178 09/01/2017   HDL 66.10 09/01/2017   LDLCALC 90 09/01/2017   LDLDIRECT 94.6 07/05/2014   TRIG 110.0 09/01/2017   CHOLHDL 3 09/01/2017   A/P: Suspect stable/controlled.  Patient is due for lipid panel-in light of current COVID 19 pandemic she would like to push this out slightly.  I asked her to call in for a physical in approximately 3 months and we will update labs at that time  #Restless legs S: Patient states most the time she takes 3 gabapentin 100 mg tablets right before bed.  She finds this very helpful for restless legs.   A/P: Stable issue- continue current medication.Occasionally if she is resting for prolonged period in the day she will have some issues and uses 100 mg in the daytime-she does not want to convert to 300 mg tablet  Of note patient did have presumed upper respiratory infection in February- she wonders if she possibly had COVID-19 and would be interested in IgG testing if this becomes available and is reasonably cost effective.  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 may request to stay off the covid-19 unit given her age-I think this is reasonable and I would be happy to write a letter if needed.  3-4 months physical  Future Appointments  Date Time Provider [place]  11/20/2018  2:20 PM Marin Olp, MD [facility]   No follow-ups on file.  Lab/Order associations: No diagnosis found.  No orders of the defined types were placed in this encounter.   Return precautions advised.  Garret Reddish, MD

## 2018-12-20 ENCOUNTER — Encounter: Payer: Self-pay | Admitting: Family Medicine

## 2019-01-02 DIAGNOSIS — H2513 Age-related nuclear cataract, bilateral: Secondary | ICD-10-CM | POA: Diagnosis not present

## 2019-01-18 ENCOUNTER — Other Ambulatory Visit: Payer: Self-pay | Admitting: Family Medicine

## 2019-01-19 NOTE — Telephone Encounter (Signed)
See note

## 2019-02-22 ENCOUNTER — Encounter: Payer: Medicare HMO | Admitting: Family Medicine

## 2019-02-23 ENCOUNTER — Other Ambulatory Visit: Payer: Self-pay

## 2019-02-23 ENCOUNTER — Ambulatory Visit (INDEPENDENT_AMBULATORY_CARE_PROVIDER_SITE_OTHER): Payer: Medicare HMO | Admitting: Family Medicine

## 2019-02-23 ENCOUNTER — Encounter: Payer: Self-pay | Admitting: Family Medicine

## 2019-02-23 VITALS — BP 110/62 | HR 96 | Temp 98.3°F | Ht 62.0 in | Wt 155.0 lb

## 2019-02-23 DIAGNOSIS — E785 Hyperlipidemia, unspecified: Secondary | ICD-10-CM | POA: Diagnosis not present

## 2019-02-23 DIAGNOSIS — S43429A Sprain of unspecified rotator cuff capsule, initial encounter: Secondary | ICD-10-CM

## 2019-02-23 DIAGNOSIS — M79604 Pain in right leg: Secondary | ICD-10-CM

## 2019-02-23 MED ORDER — PREDNISONE 20 MG PO TABS: ORAL_TABLET | ORAL | 0 refills | Status: DC

## 2019-02-23 NOTE — Progress Notes (Signed)
Phone [phone]   Subjective:  Nicole Dawson is a 68 y.o. year old very pleasant female patient who presents for/with See problem oriented charting Chief Complaint  Patient presents with  . Acute Visit  . Shoulder Pain    Sx= 3weeks ago. Pt claimed it may be work related.   . Leg Pain    hurts near groin area when pt walks.   ROS-no fever/chills/cough.  Does complain of shoulder pain and right upper leg pain  Past Medical History-  Patient Active Problem List   Diagnosis Date Noted  . IBS (irritable bowel syndrome) 07/08/2015    Priority: Medium  . Restless leg syndrome 07/05/2014    Priority: Medium  . Hyperlipidemia 05/28/2009    Priority: Medium  . BREAST CANCER, HX OF 03/11/2009    Priority: Medium  . Obesity (BMI 30.0-34.9) 12/08/2017    Priority: Low  . Greater trochanteric bursitis of left hip 05/10/2016    Priority: Low  . Sigmoid diverticulitis 07/08/2015    Priority: Low  . Hx of tobacco use, presenting hazards to health 07/08/2015    Priority: Low  . Wheezing 05/30/2013    Priority: Low  . Low back pain 08/03/2012    Priority: Low  . TRICUSPID REGURGITATION 05/28/2009    Priority: Low  . DEPRESSION 03/11/2009    Priority: Low  . Allergic rhinitis 03/11/2009    Priority: Low  . HEART MURMUR, HX OF 03/11/2009    Priority: Low  . History of herpes genitalis 03/11/2009    Priority: Low  . Osteopenia 01/19/2018    Medications- reviewed and updated Current Outpatient Medications  Medication Sig Dispense Refill  . aspirin EC 81 MG tablet Take 81 mg by mouth at bedtime.     Marland Kitchen atorvastatin (LIPITOR) 20 MG tablet TAKE 1 TABLET BY MOUTH TWICE A WEEK 26 tablet 1  . Biotin 10000 MCG TABS Take 1 tablet by mouth daily.    . Carboxymethylcellul-Glycerin (LUBRICATING EYE DROPS OP) Apply 1 drop to eye as needed (dry eyes).     . cetirizine (ZYRTEC) 10 MG tablet Take 10 mg by mouth daily as needed for allergies.     . cholecalciferol (VITAMIN D) 1000 units  tablet Take 1,000 Units by mouth 2 (two) times daily at 8 am and 10 pm.    . Coenzyme Q10 (CO Q-10) 100 MG CAPS Take by mouth daily.     . cyanocobalamin 1000 MCG tablet Take 1,000 mcg by mouth daily.    Marland Kitchen gabapentin (NEURONTIN) 100 MG capsule TAKE 1 TO 3 CAPSULES BY MOUTH THREE TIMES DAILY 270 capsule 3  . ketotifen (ZADITOR) 0.025 % ophthalmic solution Place 1 drop into both eyes daily.    . montelukast (SINGULAIR) 10 MG tablet Take 10 mg by mouth at bedtime.    . Multiple Vitamin (MULTIVITAMIN) tablet Take 1 tablet by mouth daily.      Marland Kitchen OVER THE COUNTER MEDICATION Caltrate    . triamcinolone (NASACORT AQ) 55 MCG/ACT AERO nasal inhaler Place 2 sprays into the nose daily. 1 Inhaler 12  . valACYclovir (VALTREX) 500 MG tablet Take 500 mg by mouth at bedtime.     . predniSONE (DELTASONE) 20 MG tablet Take 2 pills for 3 days, 1 pill for 4 days 10 tablet 0   No current facility-administered medications for this visit.      Objective:  BP 110/62   Pulse 96   Temp 98.3 F (36.8 C) (Oral)   Ht 5\' 2"  (1.575 m)  Wt 155 lb (70.3 kg)   SpO2 96%   BMI 28.35 kg/m  Gen: NAD, resting comfortably CV: RRR no murmurs rubs or gallops Lungs: CTAB no crackles, wheeze, rhonchi Skin: warm, dry, no rash in areas where pain is described  Right shoulder: Inspection reveals no abnormalities, atrophy or asymmetry. Palpation is normal with no tenderness over AC joint or bicipital groove.  She does have some pain over the lateral shoulder though. ROM is full in all planes. Rotator cuff strength normal throughout. signs of impingement noted with positive Neer and Hawkin's tests, empty can. She also has pain with resisted internal rotation No painful arc and no drop arm sign.  Left shoulder is normal  Right hip: Full ROM and normal strength No pain noted with hip movement and could not reproduce pain patient was complaining of.     Assessment and Plan    # Right Shoulder Pain S:No recent XR of R  shoulder or C-spine.   Patient has started with right shoulder pain 3-4 weeks ago. 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 Reaching back behind her head last week with forearm up was very painful. Doesn't feel like she can reach across her body. Hurts out into collarbone and shoulder- feels like a  Charlie horse. 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 She is right handed. Pain right now up to 5/10 with movement. Pain if lays on it.  Dull ache all day long A/P: Suspect injury of right rotator cuff.  Patient strongly considered injection versus trial of prednisone but ultimately opted for trial of prednisone and agrees to see Dr. Tamala Julian or Dr. Junius Roads if no better in 7-14 days.  She knows if she has new or worsening symptoms to let us know.  Has had slightly elevated A1c in the past and I discussed risks for blood sugar.  Also discussed other potential prednisone side effects such as jitteriness or difficulty sleeping. - Team misheard me and placed to sports medicine referral-I canceled this for now.  I asked them to mail a copy of sports medicine advisor with exercises which I instructed patient to do 3 times a week for a month and then once monthly after that as long as pain less than 1-2 out of 10  # Right groin/hip/leg Pain S:No recent XR of  Lumbar-spine or legs. Had XR L hip in 2017.  3 weeks ago was getting out of the bed and noted pain into right groin. Has continued to have pain on and off in the right upper leg/groin-on and off not constant.  A/P: No obvious hernia noted.  Hip examination largely normal.  Could not reproduce pain today.  If there is some mild hip arthritis-prednisone may be beneficial.  She will let us know if new or worsening symptoms.  #hyperlipidemia S: hopefully controlled on atorvastatin twice  A week Lab Results  Component Value Date   CHOL 178 09/01/2017   HDL 66.10 09/01/2017   LDLCALC 90  09/01/2017   LDLDIRECT 94.6 07/05/2014   TRIG 110.0 09/01/2017   CHOLHDL 3 09/01/2017   A/P: We discussed need for updated physical and blood work-she is going to schedule before she leaves  Recommended follow up: Does appear patient schedule physical as per below Future Appointments  Date Time Provider [place]  04/13/2019 11:00 AM Marin Olp, MD [facility]   Lab/Order associations:  ICD-10-CM   1. Sprain of rotator cuff capsule, unspecified laterality, initial encounter  S43.429A CANCELED: Ambulatory referral to [place] ordered this encounter  Medications  . predniSONE (DELTASONE) 20 MG tablet    Sig: Take 2 pills for 3 days, 1 pill for 4 days    Dispense:  10 tablet    Refill:  0    Return precautions advised.  Garret Reddish, MD

## 2019-02-23 NOTE — Patient Instructions (Addendum)
Trial prednisone for 7 days  If not at least 50% better in 7-14 days-  Lets get you in with Dr. Tamala Julian or Dr. Junius Roads- both excellent sports medicine doctors  If you have worsening symptoms please let us know  Do exercises 3x a week for a month and then once a week after that for maintenance- stop if pain over 1-2/10 with exercise

## 2019-03-05 ENCOUNTER — Ambulatory Visit: Payer: Self-pay | Admitting: Family Medicine

## 2019-03-05 NOTE — Telephone Encounter (Signed)
See note

## 2019-03-05 NOTE — Telephone Encounter (Signed)
Pt reports cough, productive for clear phlegm, onset 3 days ago. Reports "Just a lot of drainage, post nasal drip,nose runny, all clear." Denies any sinus tenderness, afebrile. States cough is moderate, eyes itchy, h/o allergies. States has been taking allergy medications.  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 Also reports "A little more fatigued than usual." Denies any SOB, no chest tightness or wheezing.  Pt aware practice is presently closed. Assured pt [place] would route to practice for Dr. Ansel Bong review. Pts email and ph # verified . CAre advise given per protocol. CB# [phone]  Reason for Disposition . [1] Continuous (nonstop) coughing interferes with work or school AND [2] no improvement using cough treatment per Care Advice  Answer Assessment - Initial Assessment Questions 1. ONSET: "When did the cough begin?"      3 days ago 2. SEVERITY: "How bad is the cough today?"      Moderate, mostly drainage 3. RESPIRATORY DISTRESS: "Describe your breathing."      no 4. FEVER: "Do you have a fever?" If so, ask: "What is your temperature, how was it measured, and when did it start?"     Unsure, not warm 5. SPUTUM: "Describe the color of your sputum" (clear, white, yellow, green)     clear 6. HEMOPTYSIS: "Are you coughing up any blood?" If so ask: "How much?" (flecks, streaks, tablespoons, etc.)    no 7. CARDIAC HISTORY: "Do you have any history of heart disease?" (e.g., heart attack, congestive heart failure)      *No Answer* 8. LUNG HISTORY: "Do you have any history of lung disease?"  (e.g., pulmonary embolus, asthma, emphysema)     *No Answer* 9. PE RISK FACTORS: "Do you have a history of blood clots?" (or: recent major surgery, recent prolonged travel, bedridden)     *No Answer* 10. OTHER SYMPTOMS: "Do you have any other symptoms?" (e.g., runny nose, wheezing, chest pain)       Runny nose, clear. Eyes itchy 12. 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

## 2019-03-06 ENCOUNTER — Ambulatory Visit: Payer: Medicare HMO | Admitting: Family Medicine

## 2019-03-06 NOTE — Telephone Encounter (Signed)
Can you please schedule pt for a VV today to be tested for Covid-19 Please

## 2019-03-06 NOTE — Telephone Encounter (Signed)
Patient declined to get seen at all and does not want to get tested, she is feeling better.   She also stated that she had shoulder pain and wants to give it a week to see if it gets better before scheduling.   No further action required.

## 2019-03-13 ENCOUNTER — Telehealth: Payer: Self-pay

## 2019-03-13 DIAGNOSIS — M79604 Pain in right leg: Secondary | ICD-10-CM

## 2019-03-13 NOTE — Telephone Encounter (Signed)
Copied from [place] [id number]. Topic: Quick Communication - See Telephone Encounter >> Mar 13, 2019  4:21 PM Loma [place] wrote: CRM for notification. See Telephone encounter for: 03/13/19. Pt saw Dr Lemmie Evens on 7/24 for shoulder and leg pain, was better while taking steroids but really is causing issues now, wants a referral for sports medicine. If need to discuss call her Friday at [phone]

## 2019-03-14 NOTE — Telephone Encounter (Signed)
Referral has been placed. 

## 2019-03-14 NOTE — Addendum Note (Signed)
Addended by: Jasper Loser on: 03/14/2019 10:33 AM   Modules accepted: Orders

## 2019-04-09 NOTE — Progress Notes (Signed)
[facility] [place], [place] [address] Phone: [phone] Subjective:    I'm seeing this patient by the request  of:  Marin Olp, MD   CC: Shoulder pain  [id number]   08/12/2016 Doing well overall. 2 months after the injection. Encourage still for patient to go to potentially formal physical therapy. Otherwise follow-up as needed  04/10/2019 Nicole Dawson is a 68 y.o. female coming in with complaint of right shoulder and right hip pain. States that she is painful. Has been alternating between aleve and extra strength tylenol.  Right shoulder seems to be the most aggravating, affecting daily activities and waking her up at night, some mild radiation down the arm as well.  Denies any neck pain associated with it.  Feels somewhat similar to the contralateral side.  Patient had this difficulty for years..    Past Medical History:  Diagnosis Date  . Allergy   . Breast cancer ([place])   . Depression   . Diverticulitis   . Diverticulosis   . Genital herpes   . Heart murmur   . HLD (hyperlipidemia)   . Hx of tobacco use, presenting hazards to health 07/08/2015   22 years at 1 PPD  . IBS (irritable bowel syndrome) 07/08/2015   Diagnosis by Dr. Conley Canal her prior PCP  . PONV (postoperative nausea and vomiting)   . Sigmoid diverticulitis 07/08/2015   Past Surgical History:  Procedure Laterality Date  . APPENDECTOMY  1962  . BILATERAL TOTAL MASTECTOMY WITH AXILLARY LYMPH NODE DISSECTION  2005   bilateral mastectomy  . BREAST RECONSTRUCTION  2005  . TUBAL LIGATION  1984   Social History   Socioeconomic History  . Marital status: Married    Spouse name: Not on file  . Number of children: 2  . Years of education: Not on file  . Highest education level: Not on file  Occupational History  . Occupation: CNA  Social Needs  . Financial resource strain: Not on file  . Food insecurity    Worry: Not on file    Inability: Not on file   . Transportation needs    Medical: Not on file    Non-medical: Not on file  Tobacco Use  . Smoking status: Former Smoker    Packs/day: 1.00    Years: 30.00    Pack years: 30.00    Types: Cigarettes    Quit date: 07/07/1992    Years since quitting: 26.7  . Smokeless tobacco: Never Used  Substance and Sexual Activity  . Alcohol use: No  . Drug use: No  . Sexual activity: Not on file  Lifestyle  . Physical activity    Days per week: Not on file    Minutes per session: Not on file  . Stress: Not on file  Relationships  . Social Herbalist on phone: Not on file    Gets together: Not on file    Attends religious service: Not on file    Active member of club or organization: Not on file    Attends meetings of clubs or organizations: Not on file    Relationship status: Not on file  Other Topics Concern  . Not on file  Social History Narrative   Lives in [place]. Married 2007. 2 kids (son and daughter). 2 grandkids (granddaughter and grandson). Both in [place]. Grandson on on the way (son's) in April 2016.       Works at [employer]  hospital- a branch of [employer].    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       Hobbies: computer games, tv   No Known Allergies Family History  Problem Relation Age of Onset  . Hyperlipidemia Mother   . Hypertension Mother   . COPD Mother   . Stroke Mother        in 50s.   Marland Kitchen Heart attack Father        49.   . Tuberculosis Father        1/2 of lung removed.   . Hyperlipidemia Father   . Lymphoma Maternal Aunt     Current Outpatient Medications (Endocrine & Metabolic):  .  predniSONE (DELTASONE) 20 MG tablet, Take 2 pills for 3 days, 1 pill for 4 days  Current Outpatient Medications (Cardiovascular):  .  atorvastatin (LIPITOR) 20 MG tablet, TAKE 1 TABLET BY MOUTH TWICE A WEEK  Current Outpatient Medications (Respiratory):  .  cetirizine (ZYRTEC) 10 MG tablet, Take 10 mg by mouth daily as needed for  allergies.  .  montelukast (SINGULAIR) 10 MG tablet, Take 10 mg by mouth at bedtime. .  triamcinolone (NASACORT AQ) 55 MCG/ACT AERO nasal inhaler, Place 2 sprays into the nose daily.  Current Outpatient Medications (Analgesics):  .  aspirin EC 81 MG tablet, Take 81 mg by mouth at bedtime.   Current Outpatient Medications (Hematological):  .  cyanocobalamin 1000 MCG tablet, Take 1,000 mcg by mouth daily.  Current Outpatient Medications (Other):  .  Biotin 10000 MCG TABS, Take 1 tablet by mouth daily. .  Carboxymethylcellul-Glycerin (LUBRICATING EYE DROPS OP), Apply 1 drop to eye as needed (dry eyes).  .  cholecalciferol (VITAMIN D) 1000 units tablet, Take 1,000 Units by mouth 2 (two) times daily at 8 am and 10 pm. .  Coenzyme Q10 (CO Q-10) 100 MG CAPS, Take by mouth daily.  Marland Kitchen  gabapentin (NEURONTIN) 100 MG capsule, TAKE 1 TO 3 CAPSULES BY MOUTH THREE TIMES DAILY .  ketotifen (ZADITOR) 0.025 % ophthalmic solution, Place 1 drop into both eyes daily. .  Multiple Vitamin (MULTIVITAMIN) tablet, Take 1 tablet by mouth daily.   Marland Kitchen  OVER THE COUNTER MEDICATION, Caltrate .  valACYclovir (VALTREX) 500 MG tablet, Take 500 mg by mouth at bedtime.     Past medical history, social, surgical and family history all reviewed in electronic medical record.  No pertanent information unless stated regarding to the chief complaint.   Review of Systems:  No headache, visual changes, nausea, vomiting, diarrhea, constipation, dizziness, abdominal pain, skin rash, fevers, chills, night sweats, weight loss, swollen lymph nodes, body aches, joint swelling, muscle aches, chest pain, shortness of breath, mood changes.   Objective  There were no vitals taken for this visit. Systems examined below as of    General: No apparent distress alert and oriented x3 mood and affect normal, dressed appropriately.  HEENT: Pupils equal, extraocular movements intact  Respiratory: Patient's speak in full sentences and does not  appear short of breath  Cardiovascular: No lower extremity edema, non tender, no erythema  Skin: Warm dry intact with no signs of infection or rash on extremities or on axial skeleton.  Abdomen: Soft nontender  Neuro: Cranial nerves II through XII are intact, neurovascularly intact in all extremities with 2+ DTRs and 2+ pulses.  Lymph: No lymphadenopathy of posterior or anterior cervical chain or axillae bilaterally.  Gait normal with good balance and coordination.  MSK:  Non tender with  full range of motion and good stability and symmetric strength and tone of  elbows, wrist, hip, knee and ankles bilaterally.  Shoulder: Right Inspection reveals no abnormalities, atrophy or asymmetry. Palpation is normal with no tenderness over AC joint or bicipital groove. ROM is full in all planes passively. Rotator cuff strength normal throughout. signs of impingement with positive Neer and Hawkin's tests, but negative empty can sign. Speeds and Yergason's tests normal. No labral pathology noted with negative Obrien's, negative clunk and good stability. Normal scapular function observed. No painful arc and no drop arm sign. No apprehension sign  MSK US performed of: Right This study was ordered, performed, and interpreted by Charlann Boxer D.O.  Shoulder:   Supraspinatus:  Appears normal on long and transverse views, Bursal bulge seen with shoulder abduction on impingement view. Infraspinatus:  Appears normal on long and transverse views. Significant increase in Doppler flow Subscapularis:  Appears normal on long and transverse views. Positive bursa Teres Minor:  Appears normal on long and transverse views. AC joint:  Capsule undistended, no geyser sign. Glenohumeral Joint:  Appears normal without effusion. Glenoid Labrum:  Intact without visualized tears. Biceps Tendon:  Appears normal on long and transverse views, no fraying of tendon, tendon located in intertubercular groove, no subluxation with  shoulder internal or external rotation.  Impression: Subacromial bursitis  Procedure: Real-time Ultrasound Guided Injection of right glenohumeral joint Device: GE Logiq E  Ultrasound guided injection is preferred based studies that show increased duration, increased effect, greater accuracy, decreased procedural pain, increased response rate with ultrasound guided versus blind injection.  Verbal informed consent obtained.  Time-out conducted.  Noted no overlying erythema, induration, or other signs of local infection.  Skin prepped in a sterile fashion.  Local anesthesia: Topical Ethyl chloride.  With sterile technique and under real time ultrasound guidance:  Joint visualized.  23g 1  inch needle inserted posterior approach. Pictures taken for needle placement. Patient did have injection of 2 cc of 1% lidocaine, 2 cc of 0.5% Marcaine, and 1.0 cc of Kenalog 40 mg/dL. Completed without difficulty  Pain immediately resolved suggesting accurate placement of the medication.  Advised to call if fevers/chills, erythema, induration, drainage, or persistent bleeding.  Images permanently stored and available for review in the ultrasound unit.  Impression: Technically successful ultrasound guided injection.   Impression and Recommendations:     This case required medical decision making of moderate complexity. The above documentation has been reviewed and is accurate and complete Lyndal Pulley, DO       Note: This dictation was prepared with Dragon dictation along with smaller phrase technology. Any transcriptional errors that result from this process are unintentional.

## 2019-04-10 ENCOUNTER — Ambulatory Visit (INDEPENDENT_AMBULATORY_CARE_PROVIDER_SITE_OTHER): Payer: Medicare HMO | Admitting: Family Medicine

## 2019-04-10 ENCOUNTER — Other Ambulatory Visit: Payer: Self-pay

## 2019-04-10 ENCOUNTER — Ambulatory Visit: Payer: Self-pay

## 2019-04-10 ENCOUNTER — Encounter: Payer: Self-pay | Admitting: Family Medicine

## 2019-04-10 VITALS — BP 110/60 | HR 67 | Ht 62.0 in | Wt 154.0 lb

## 2019-04-10 DIAGNOSIS — M7551 Bursitis of right shoulder: Secondary | ICD-10-CM

## 2019-04-10 DIAGNOSIS — M25551 Pain in right hip: Secondary | ICD-10-CM

## 2019-04-10 NOTE — Assessment & Plan Note (Signed)
Injection given today, discussed icing regimen, discussed compression, Voltaren over-the-counter, icing regimen, follow-up again in 4 to 8 weeks.  Worsening symptoms consider formal physical therapy

## 2019-04-10 NOTE — Patient Instructions (Signed)
Good to see you Exercise 3 times a week Follow up with me in 5-6 weeks

## 2019-04-13 ENCOUNTER — Ambulatory Visit (INDEPENDENT_AMBULATORY_CARE_PROVIDER_SITE_OTHER): Payer: Medicare HMO | Admitting: Family Medicine

## 2019-04-13 ENCOUNTER — Other Ambulatory Visit: Payer: Self-pay

## 2019-04-13 ENCOUNTER — Encounter: Payer: Self-pay | Admitting: Family Medicine

## 2019-04-13 VITALS — BP 118/78 | HR 75 | Temp 97.8°F | Ht 62.0 in | Wt 151.8 lb

## 2019-04-13 DIAGNOSIS — Z23 Encounter for immunization: Secondary | ICD-10-CM

## 2019-04-13 DIAGNOSIS — K589 Irritable bowel syndrome without diarrhea: Secondary | ICD-10-CM | POA: Diagnosis not present

## 2019-04-13 DIAGNOSIS — E663 Overweight: Secondary | ICD-10-CM

## 2019-04-13 DIAGNOSIS — E785 Hyperlipidemia, unspecified: Secondary | ICD-10-CM | POA: Diagnosis not present

## 2019-04-13 DIAGNOSIS — Z0000 Encounter for general adult medical examination without abnormal findings: Secondary | ICD-10-CM | POA: Diagnosis not present

## 2019-04-13 DIAGNOSIS — R739 Hyperglycemia, unspecified: Secondary | ICD-10-CM

## 2019-04-13 LAB — COMPREHENSIVE METABOLIC PANEL
ALT: 18 U/L (ref 0–35)
AST: 16 U/L (ref 0–37)
Albumin: 4.1 g/dL (ref 3.5–5.2)
Alkaline Phosphatase: 95 U/L (ref 39–117)
BUN: 26 mg/dL — ABNORMAL HIGH (ref 6–23)
CO2: 32 mEq/L (ref 19–32)
Calcium: 9.7 mg/dL (ref 8.4–10.5)
Chloride: 105 mEq/L (ref 96–112)
Creatinine, Ser: 0.83 mg/dL (ref 0.40–1.20)
GFR: 68.72 mL/min (ref >60.00)
Glucose, Bld: 87 mg/dL (ref 70–99)
Potassium: 4.4 mEq/L (ref 3.5–5.1)
Sodium: 142 mEq/L (ref 135–145)
Total Bilirubin: 0.7 mg/dL (ref 0.2–1.2)
Total Protein: 6.9 g/dL (ref 6.0–8.3)

## 2019-04-13 LAB — LIPID PANEL
Cholesterol: 179 mg/dL (ref 0–200)
HDL: 64.7 mg/dL (ref >39.00)
LDL Cholesterol: 75 mg/dL (ref 0–99)
NonHDL: 114.29
Total CHOL/HDL Ratio: 3
Triglycerides: 194 mg/dL — ABNORMAL HIGH (ref 0.0–149.0)
VLDL: 38.8 mg/dL (ref 0.0–40.0)

## 2019-04-13 LAB — URINALYSIS, ROUTINE W REFLEX MICROSCOPIC
Bilirubin Urine: NEGATIVE
Leukocytes,Ua: NEGATIVE
Nitrite: NEGATIVE
Specific Gravity, Urine: >=1.03 — AB (ref 1.000–1.030)
Total Protein, Urine: NEGATIVE
Urine Glucose: NEGATIVE
Urobilinogen, UA: 0.2 (ref 0.0–1.0)
pH: 5.5 (ref 5.0–8.0)

## 2019-04-13 LAB — CBC
HCT: 41 % (ref 36.0–46.0)
Hemoglobin: 14 g/dL (ref 12.0–15.0)
MCHC: 34.1 g/dL (ref 30.0–36.0)
MCV: 97.9 fl (ref 78.0–100.0)
Platelets: 246 10*3/uL (ref 150.0–400.0)
RBC: 4.19 Mil/uL (ref 3.87–5.11)
RDW: 13.7 % (ref 11.5–15.5)
WBC: 6.9 10*3/uL (ref 4.0–10.5)

## 2019-04-13 LAB — HEMOGLOBIN A1C: Hgb A1c MFr Bld: 5.6 % (ref 4.6–6.5)

## 2019-04-13 NOTE — Progress Notes (Signed)
Phone: [phone]   Subjective:  Patient presents today for their annual physical. Chief complaint-noted.   See problem oriented charting-  ROS- full  review of systems was completed and negative except for: activity change, hearing loss, tinnitus, eye itching, cough, joint pain.   The following were reviewed and entered/updated in epic: Past Medical History:  Diagnosis Date  . Allergy   . Breast cancer ([place])   . Depression   . Diverticulitis   . Diverticulosis   . Genital herpes   . Heart murmur   . HLD (hyperlipidemia)   . Hx of tobacco use, presenting hazards to health 07/08/2015   22 years at 1 PPD  . IBS (irritable bowel syndrome) 07/08/2015   Diagnosis by Dr. Conley Canal her prior PCP  . PONV (postoperative nausea and vomiting)   . Sigmoid diverticulitis 07/08/2015   Patient Active Problem List   Diagnosis Date Noted  . IBS (irritable bowel syndrome) 07/08/2015    Priority: Medium  . Restless leg syndrome 07/05/2014    Priority: Medium  . Hyperlipidemia 05/28/2009    Priority: Medium  . BREAST CANCER, HX OF 03/11/2009    Priority: Medium  . Obesity (BMI 30.0-34.9) 12/08/2017    Priority: Low  . Greater trochanteric bursitis of left hip 05/10/2016    Priority: Low  . Sigmoid diverticulitis 07/08/2015    Priority: Low  . Hx of tobacco use, presenting hazards to health 07/08/2015    Priority: Low  . Wheezing 05/30/2013    Priority: Low  . Low back pain 08/03/2012    Priority: Low  . TRICUSPID REGURGITATION 05/28/2009    Priority: Low  . DEPRESSION 03/11/2009    Priority: Low  . Allergic rhinitis 03/11/2009    Priority: Low  . HEART MURMUR, HX OF 03/11/2009    Priority: Low  . History of herpes genitalis 03/11/2009    Priority: Low  . Hyperglycemia 04/13/2019  . Subacromial bursitis of right shoulder joint 04/10/2019  . Osteopenia 01/19/2018   Past Surgical History:  Procedure Laterality Date  . APPENDECTOMY  1962  . BILATERAL TOTAL MASTECTOMY WITH  AXILLARY LYMPH NODE DISSECTION  2005   bilateral mastectomy  . BREAST RECONSTRUCTION  2005  . TUBAL LIGATION  1984    Family History  Problem Relation Age of Onset  . Hyperlipidemia Mother   . Hypertension Mother   . COPD Mother   . Stroke Mother        in 65s.   Marland Kitchen Heart attack Father        67.   . Tuberculosis Father        1/2 of lung removed.   . Hyperlipidemia Father   . Lymphoma Maternal Aunt     Medications- reviewed and updated Current Outpatient Medications  Medication Sig Dispense Refill  . aspirin EC 81 MG tablet Take 81 mg by mouth at bedtime.     Marland Kitchen atorvastatin (LIPITOR) 20 MG tablet TAKE 1 TABLET BY MOUTH TWICE A WEEK 26 tablet 1  . Biotin 10000 MCG TABS Take 1 tablet by mouth daily.    . Carboxymethylcellul-Glycerin (LUBRICATING EYE DROPS OP) Apply 1 drop to eye as needed (dry eyes).     . cetirizine (ZYRTEC) 10 MG tablet Take 10 mg by mouth daily as needed for allergies.     . cholecalciferol (VITAMIN D) 1000 units tablet Take 1,000 Units by mouth 2 (two) times daily at 8 am and 10 pm.    . Coenzyme Q10 (CO Q-10) 100 MG  CAPS Take by mouth daily.     . cyanocobalamin 1000 MCG tablet Take 1,000 mcg by mouth daily.    Marland Kitchen gabapentin (NEURONTIN) 100 MG capsule TAKE 1 TO 3 CAPSULES BY MOUTH THREE TIMES DAILY 270 capsule 3  . ketotifen (ZADITOR) 0.025 % ophthalmic solution Place 1 drop into both eyes daily.    . montelukast (SINGULAIR) 10 MG tablet Take 10 mg by mouth at bedtime.    . Multiple Vitamin (MULTIVITAMIN) tablet Take 1 tablet by mouth daily.      Marland Kitchen OVER THE COUNTER MEDICATION Caltrate    . triamcinolone (NASACORT AQ) 55 MCG/ACT AERO nasal inhaler Place 2 sprays into the nose daily. 1 Inhaler 12  . valACYclovir (VALTREX) 500 MG tablet Take 500 mg by mouth at bedtime.      No current facility-administered medications for this visit.     Allergies-reviewed and updated No Known Allergies  Social History   Social History Narrative   Lives in [place]. Married  2007. 2 kids (son and daughter). 2 grandkids (granddaughter and grandson). Both in [place]. Grandson on on the way (son's) in April 2016.       Works at [employer]- a branch of [employer].    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       Hobbies: computer games, tv   Objective  Objective:  BP 118/78 (BP Location: Left Arm, Patient Position: Sitting, Cuff Size: Normal)   Pulse 75   Temp 97.8 F (36.6 C) (Temporal)   Ht 5\' 2"  (1.575 m)   Wt 151 lb 12.8 oz (68.9 kg)   SpO2 97%   BMI 27.76 kg/m  Gen: NAD, resting comfortably HEENT: Mucous membranes are moist. Oropharynx normal Neck: no thyromegaly CV: RRR no murmurs rubs or gallops Lungs: CTAB no crackles, wheeze, rhonchi Abdomen: soft/nontender/nondistended/normal bowel sounds. No rebound or guarding.  Ext: no edema Skin: warm, dry Neuro: grossly normal, moves all extremities, PERRLA   Assessment and Plan    68 y.o. female presenting for annual physical.  Health Maintenance counseling: 1. Anticipatory guidance: Patient counseled regarding regular dental exams -q6 months, eye exams - yearly,  avoiding smoking and second hand smoke , limiting alcohol to 1 beverage per day- doesn't drink .   2. Risk factor reduction:  Advised patient of need for regular exercise and diet rich and fruits and vegetables to reduce risk of heart attack and stroke. Exercise- not regularly exercising- hoping [facility] will open back up so she can get into swimmming. Diet-  Overweight per BMI-down 13 pounds from last physical. Feels like working hard at work is contributing to this.  Wt Readings from Last 3 Encounters:  04/13/19 151 lb 12.8 oz (68.9 kg)  04/10/19 154 lb (69.9 kg)  02/23/19 155 lb (70.3 kg)  3. Immunizations/screenings/ancillary studies flu shot today.  Pneumovax 23 today.  Defer Shingrix - will check with pharmacy.  Immunization History  Administered Date(s) Administered  . Hepatitis B 04/02/2013  .  Influenza,inj,Quad PF,6+ Mos 05/29/2018  . Influenza-Unspecified 04/02/2013, 05/02/2014, 06/11/2014, 06/18/2015, 06/02/2016, 03/28/2017  . Pneumococcal Conjugate-13 03/24/2018  . Td 05/28/2009  . Zoster 07/27/2016  4. Cervical cancer screening- last Pap smear with Dr. Stann Mainland October 2017-passed age to based required repeat 5. Breast cancer screening-  breast exam with gynecology and mammogram- not done due to prophylactic mastectomy with history of breast cancer in 2005-follows up with [facility] yearly- plastic surgeon 6. Colon cancer screening - April 2017 with 5-year  follow-up.  Had polyp that was hyperplastic only. 7. Skin cancer screening- no dermatologist- has thought about seeing skin surgery center for a full check up. advised regular sunscreen use. Denies worrisome, changing, or new skin lesions.  8. Birth control/STD check-postmenopausal/monogamous 9. Osteoporosis screening at 60- see osteopenia discussion below dexa 2019- repeat 2021 or 2022 -Former smoker- quit around 20 years ago.  22 pack years.  Urine normal last year- possible hematuria last year follow-up test was okay-repeat urinalysis with reflex today  Status of chronic or acute concerns  Allergic Rhinitis - Taking zyrtec and/or Singulair. Using Nasacort prn- taking pretty regularly. No recent nosebleeds.  Eye itching related to this.   IBS -discussed possible probiotic trial last year. Some constipation when takes aleve (will get BMP/cmp)  Osteopenia -noted on last bone density- continue calcium and vitamin D.  Hyperlipidemia - Taking Atorvastatin 20 mg twice weekly.  Update lipids with LDL goal at least under 100  Depression -PHQ 9 under 5 today-full remission.no curent issues  Low back pain/joint pain- low-dose gabapentin seems to be helpful.  This is also used for restless legs-300 mg at bedtime-also can use 100 mg for sitting for prolonged periods in the daytime  Hearing loss/tinnitus- discussed likely due to hearing  loss over time- not recently worsening. May get hearing test at [facility]- not ready for hearing aids though  Moved into new house and fell onto hands/knees and scraped knees when tripped over an area she was not used to- has had some right shoulder pain- saw ortho and was told had collar bone fracture apparently on ultrasound. Some trouble sleeping.  Injury dates back to July - also was told mild carpal tunnel  Recommended follow up: 1 year or sooner if needed Future Appointments  Date Time Provider [place]  05/15/2019  8:45 AM Lyndal Pulley, DO [id number]    Lab/Order associations: not fasting    ICD-10-CM   1. Preventative health care  Z00.00 CBC    Comprehensive metabolic panel    Lipid panel  2. Hyperlipidemia, unspecified hyperlipidemia type  E78.5 CBC    Comprehensive metabolic panel    Lipid panel  3. Irritable bowel syndrome, unspecified type  K58.9   4. Overweight  E66.3   5. Hyperglycemia  R73.9     No orders of the defined types were placed in this encounter.   Return precautions advised.  Garret Reddish, MD

## 2019-04-13 NOTE — Addendum Note (Signed)
Addended by: Jasper Loser on: 04/13/2019 12:05 PM   Modules accepted: Orders

## 2019-04-13 NOTE — Patient Instructions (Addendum)
Health Maintenance Due  Topic Date Due  . INFLUENZA VACCINE - today 03/03/2019  . PNA vac Low Risk Adult (2 of 2 - PPSV23) - today 03/25/2019    Please stop by lab before you go If you do not have mychart- we will call you about results within 5 business days of [place] receiving them.  If you have mychart- we will send your results within 3 business days of [place] receiving them.  If abnormal or we want to clarify a result, we will call or mychart you to make sure you receive the message.  If you have questions or concerns or don't hear within 5-7 days, please send [place] a message or call us.

## 2019-04-26 ENCOUNTER — Other Ambulatory Visit: Payer: Self-pay

## 2019-04-26 DIAGNOSIS — R8290 Unspecified abnormal findings in urine: Secondary | ICD-10-CM

## 2019-04-26 NOTE — Progress Notes (Signed)
LVM for patient to call back and schedule a lab appt.

## 2019-05-01 NOTE — Patient Instructions (Addendum)
This looks like your uterus and potentially bladder are sagging down- call uterine prolapse and bladder prolapse  The first thing to do is to practice Keagle exercises regularly  Also since this is beginning to show at the vaginal opening-I think you should follow-up with Dr. Krista Blue call to schedule an appointment    Kegel Exercises  Kegel exercises can help strengthen your pelvic floor muscles. The pelvic floor is a group of muscles that support your rectum, small intestine, and bladder. In females, pelvic floor muscles also help support the womb (uterus). These muscles help you control the flow of urine and stool. Kegel exercises are painless and simple, and they do not require any equipment. Your provider may suggest Kegel exercises to:  Improve bladder and bowel control.  Improve sexual response.  Improve weak pelvic floor muscles after surgery to remove the uterus (hysterectomy) or pregnancy (females).  Improve weak pelvic floor muscles after prostate gland removal or surgery (males). Kegel exercises involve squeezing your pelvic floor muscles, which are the same muscles you squeeze when you try to stop the flow of urine or keep from passing gas. The exercises can be done while sitting, standing, or lying down, but it is best to vary your position. Exercises How to do Kegel exercises: 1. Squeeze your pelvic floor muscles tight. You should feel a tight lift in your rectal area. If you are a female, you should also feel a tightness in your vaginal area. Keep your stomach, buttocks, and legs relaxed. 2. Hold the muscles tight for up to 10 seconds. 3. Breathe normally. 4. Relax your muscles. 5. Repeat as told by your health care provider. Repeat this exercise daily as told by your health care provider. Continue to do this exercise for at least 4-6 weeks, or for as long as told by your health care provider. You may be referred to a physical therapist who can help you learn more  about how to do Kegel exercises. Depending on your condition, your health care provider may recommend:  Varying how long you squeeze your muscles.  Doing several sets of exercises every day.  Doing exercises for several weeks.  Making Kegel exercises a part of your regular exercise routine. This information is not intended to replace advice given to you by your health care provider. Make sure you discuss any questions you have with your health care provider. Document Released: 07/05/2012 Document Revised: 03/08/2018 Document Reviewed: 03/08/2018 Elsevier Patient Education  2020 [employer].

## 2019-05-01 NOTE — Progress Notes (Signed)
Phone [phone]   Subjective:  Nicole Dawson is a 68 y.o. year old very pleasant female patient who presents for/with See problem oriented charting Chief Complaint  Patient presents with  . Follow-up  . swollen vagina   ROS-no fever/chills/cough/congestion reported.  Past Medical History-  Patient Active Problem List   Diagnosis Date Noted  . IBS (irritable bowel syndrome) 07/08/2015    Priority: Medium  . Restless leg syndrome 07/05/2014    Priority: Medium  . Hyperlipidemia 05/28/2009    Priority: Medium  . BREAST CANCER, HX OF 03/11/2009    Priority: Medium  . Obesity (BMI 30.0-34.9) 12/08/2017    Priority: Low  . Greater trochanteric bursitis of left hip 05/10/2016    Priority: Low  . Sigmoid diverticulitis 07/08/2015    Priority: Low  . Hx of tobacco use, presenting hazards to health 07/08/2015    Priority: Low  . Wheezing 05/30/2013    Priority: Low  . Low back pain 08/03/2012    Priority: Low  . TRICUSPID REGURGITATION 05/28/2009    Priority: Low  . DEPRESSION 03/11/2009    Priority: Low  . Allergic rhinitis 03/11/2009    Priority: Low  . HEART MURMUR, HX OF 03/11/2009    Priority: Low  . History of herpes genitalis 03/11/2009    Priority: Low  . Hyperglycemia 04/13/2019  . Subacromial bursitis of right shoulder joint 04/10/2019  . Osteopenia 01/19/2018    Medications- reviewed and updated Current Outpatient Medications  Medication Sig Dispense Refill  . aspirin EC 81 MG tablet Take 81 mg by mouth at bedtime.     Marland Kitchen atorvastatin (LIPITOR) 20 MG tablet TAKE 1 TABLET BY MOUTH TWICE A WEEK 26 tablet 1  . Biotin 10000 MCG TABS Take 1 tablet by mouth daily.    . Carboxymethylcellul-Glycerin (LUBRICATING EYE DROPS OP) Apply 1 drop to eye as needed (dry eyes).     . cetirizine (ZYRTEC) 10 MG tablet Take 10 mg by mouth daily as needed for allergies.     . cholecalciferol (VITAMIN D) 1000 units tablet Take 1,000 Units by mouth 2 (two) times daily at 8 am  and 10 pm.    . Coenzyme Q10 (CO Q-10) 100 MG CAPS Take by mouth daily.     . cyanocobalamin 1000 MCG tablet Take 1,000 mcg by mouth daily.    Marland Kitchen gabapentin (NEURONTIN) 100 MG capsule TAKE 1 TO 3 CAPSULES BY MOUTH THREE TIMES DAILY 270 capsule 3  . ketotifen (ZADITOR) 0.025 % ophthalmic solution Place 1 drop into both eyes daily.    . montelukast (SINGULAIR) 10 MG tablet Take 10 mg by mouth at bedtime.    . Multiple Vitamin (MULTIVITAMIN) tablet Take 1 tablet by mouth daily.      Marland Kitchen OVER THE COUNTER MEDICATION Caltrate    . triamcinolone (NASACORT AQ) 55 MCG/ACT AERO nasal inhaler Place 2 sprays into the nose daily. 1 Inhaler 12  . valACYclovir (VALTREX) 500 MG tablet Take 500 mg by mouth at bedtime.      No current facility-administered medications for this visit.      Objective:  BP 120/64   Pulse 78   Temp 97.9 F (36.6 C)   Ht 5\' 1"  (1.549 m)   Wt 152 lb 6.4 oz (69.1 kg)   SpO2 99%   BMI 28.80 kg/m  Gen: NAD, resting comfortably CV: RRR  Lungs: nonlabored, normal respiratory rate Abdomen: soft/nondistended GU: When patient bears down- portions of uterus visible at vaginal introitus- also appears to  have visibility of bladder- able to replace displacement with significant urge to urinate noted    Assessment and Plan  #Perception of swollen Vagina S:pt states there is something that has descend into her vagina that looks like an egg- looked with a mirror. She denies pain but does have pressure- mld pressure but worse after standing long periods. Pt states it does not itch or burn when urinating. It is more aggravated after working a 12 hr shift. Pt does have a GYN (Dr. Vania Rea)- last seen 2 years ago and no abnormalities noted.   In retrospect may have had symptoms for at least a year- she is ultimately decided to investigate further by looking with a mirror recently and became more alarmed A/P: 68 year old female with what appears to be uterine prolapse- examination also  appears to show some bladder prolapse- referred her back to her gynecologist-we were actually able to set her up with a visit and they will call patient to get her in.  I counseled her today on importance of Kegle exercises.  We also discussed possible treatments such as pessary or even surgery potentially  #Hematuria- last urinalysis with possible blood- we were not able to get RBC results-urine microscopic will be obtained today  Recommended follow up: PRN for acute issue Future Appointments  Date Time Provider [place]  05/15/2019  8:45 AM Lyndal Pulley, DO [facility]   Lab/Order associations:   ICD-10-CM   1. Uterine prolapse  N81.4   2. Abnormal urinalysis  R82.90 Urine Microscopic  3. Hematuria, unspecified type  R31.9    Return precautions advised.  Garret Reddish, MD

## 2019-05-02 ENCOUNTER — Encounter: Payer: Self-pay | Admitting: Family Medicine

## 2019-05-02 ENCOUNTER — Other Ambulatory Visit: Payer: Medicare HMO

## 2019-05-02 ENCOUNTER — Other Ambulatory Visit: Payer: Self-pay

## 2019-05-02 ENCOUNTER — Ambulatory Visit (INDEPENDENT_AMBULATORY_CARE_PROVIDER_SITE_OTHER): Payer: Medicare HMO | Admitting: Family Medicine

## 2019-05-02 VITALS — BP 120/64 | HR 78 | Temp 97.9°F | Ht 61.0 in | Wt 152.4 lb

## 2019-05-02 DIAGNOSIS — N814 Uterovaginal prolapse, unspecified: Secondary | ICD-10-CM | POA: Diagnosis not present

## 2019-05-02 DIAGNOSIS — R319 Hematuria, unspecified: Secondary | ICD-10-CM

## 2019-05-02 DIAGNOSIS — R8290 Unspecified abnormal findings in urine: Secondary | ICD-10-CM | POA: Diagnosis not present

## 2019-05-02 LAB — URINALYSIS, MICROSCOPIC ONLY

## 2019-05-03 ENCOUNTER — Other Ambulatory Visit: Payer: Self-pay

## 2019-05-03 DIAGNOSIS — R3129 Other microscopic hematuria: Secondary | ICD-10-CM

## 2019-05-03 NOTE — Progress Notes (Signed)
Referral placed.

## 2019-05-07 ENCOUNTER — Telehealth: Payer: Self-pay | Admitting: Family Medicine

## 2019-05-07 NOTE — Telephone Encounter (Signed)
Pt states she received UA results "But I don't understand them." Reviewed with pt to her satisfaction, no further questions.

## 2019-05-13 NOTE — Progress Notes (Signed)
[name] [address] [place], [place] [address] Phone: [phone] Subjective:   I [name] am serving as a Education administrator for Dr. Hulan Saas.   CC: Right hip pain right shoulder follow-up  [id number]   04/10/2019 Injection given today, discussed icing regimen, discussed compression, Voltaren over-the-counter, icing regimen, follow-up again in 4 to 8 weeks.  Worsening symptoms consider formal physical therapy  05/15/2019 Nicole Dawson is a 68 y.o. female coming in with complaint of right hip and right shoulder pain. Patient states that she has no pain.  Patient continues to feel abdominal 100% better at this moment.  Some mild discomfort from time to time but nothing severe.  Patient has been able to increase activity as tolerated.  No longer having the swelling she has had previously.  Happy with the results.      Past Medical History:  Diagnosis Date  . Allergy   . Breast cancer ([place])   . Depression   . Diverticulitis   . Diverticulosis   . Genital herpes   . Heart murmur   . HLD (hyperlipidemia)   . Hx of tobacco use, presenting hazards to health 07/08/2015   22 years at 1 PPD  . IBS (irritable bowel syndrome) 07/08/2015   Diagnosis by Dr. Conley Canal her prior PCP  . PONV (postoperative nausea and vomiting)   . Sigmoid diverticulitis 07/08/2015   Past Surgical History:  Procedure Laterality Date  . APPENDECTOMY  1962  . BILATERAL TOTAL MASTECTOMY WITH AXILLARY LYMPH NODE DISSECTION  2005   bilateral mastectomy  . BREAST RECONSTRUCTION  2005  . TUBAL LIGATION  1984   Social History   Socioeconomic History  . Marital status: Married    Spouse name: Not on file  . Number of children: 2  . Years of education: Not on file  . Highest education level: Not on file  Occupational History  . Occupation: CNA  Social Needs  . Financial resource strain: Not on file  . Food insecurity    Worry: Not on file    Inability: Not on file  .  Transportation needs    Medical: Not on file    Non-medical: Not on file  Tobacco Use  . Smoking status: Former Smoker    Packs/day: 1.00    Years: 30.00    Pack years: 30.00    Types: Cigarettes    Quit date: 07/07/1992    Years since quitting: 26.8  . Smokeless tobacco: Never Used  Substance and Sexual Activity  . Alcohol use: No  . Drug use: No  . Sexual activity: Not on file  Lifestyle  . Physical activity    Days per week: Not on file    Minutes per session: Not on file  . Stress: Not on file  Relationships  . Social Herbalist on phone: Not on file    Gets together: Not on file    Attends religious service: Not on file    Active member of club or organization: Not on file    Attends meetings of clubs or organizations: Not on file    Relationship status: Not on file  Other Topics Concern  . Not on file  Social History Narrative   Lives in [place]. Married 2007. 2 kids (son and daughter). 2 grandkids (granddaughter and grandson). Both in [place]. Grandson on on the way (son's) in April 2016.       Works at [employer]- a branch  of [employer].    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       Hobbies: computer games, tv   No Known Allergies Family History  Problem Relation Age of Onset  . Hyperlipidemia Mother   . Hypertension Mother   . COPD Mother   . Stroke Mother        in 77s.   Marland Kitchen Heart attack Father        64.   . Tuberculosis Father        1/2 of lung removed.   . Hyperlipidemia Father   . Lymphoma Maternal Aunt      Current Outpatient Medications (Cardiovascular):  .  atorvastatin (LIPITOR) 20 MG tablet, TAKE 1 TABLET BY MOUTH TWICE A WEEK  Current Outpatient Medications (Respiratory):  .  cetirizine (ZYRTEC) 10 MG tablet, Take 10 mg by mouth daily as needed for allergies.  .  montelukast (SINGULAIR) 10 MG tablet, Take 10 mg by mouth at bedtime. .  triamcinolone (NASACORT AQ) 55 MCG/ACT AERO nasal inhaler,  Place 2 sprays into the nose daily.  Current Outpatient Medications (Analgesics):  .  aspirin EC 81 MG tablet, Take 81 mg by mouth at bedtime.   Current Outpatient Medications (Hematological):  .  cyanocobalamin 1000 MCG tablet, Take 1,000 mcg by mouth daily.  Current Outpatient Medications (Other):  .  Biotin 10000 MCG TABS, Take 1 tablet by mouth daily. .  Carboxymethylcellul-Glycerin (LUBRICATING EYE DROPS OP), Apply 1 drop to eye as needed (dry eyes).  .  cholecalciferol (VITAMIN D) 1000 units tablet, Take 1,000 Units by mouth 2 (two) times daily at 8 am and 10 pm. .  Coenzyme Q10 (CO Q-10) 100 MG CAPS, Take by mouth daily.  Marland Kitchen  gabapentin (NEURONTIN) 100 MG capsule, TAKE 1 TO 3 CAPSULES BY MOUTH THREE TIMES DAILY .  ketotifen (ZADITOR) 0.025 % ophthalmic solution, Place 1 drop into both eyes daily. .  Multiple Vitamin (MULTIVITAMIN) tablet, Take 1 tablet by mouth daily.   Marland Kitchen  OVER THE COUNTER MEDICATION, Caltrate .  valACYclovir (VALTREX) 500 MG tablet, Take 500 mg by mouth at bedtime.     Past medical history, social, surgical and family history all reviewed in electronic medical record.  No pertanent information unless stated regarding to the chief complaint.   Review of Systems:  No headache, visual changes, nausea, vomiting, diarrhea, constipation, dizziness, abdominal pain, skin rash, fevers, chills, night sweats, weight loss, swollen lymph nodes, body aches, joint swelling, muscle aches, chest pain, shortness of breath, mood changes.   Objective  Blood pressure 100/74, pulse 74, height 5\' 1"  (1.549 m), SpO2 97 %.     General: No apparent distress alert and oriented x3 mood and affect normal, dressed appropriately.  HEENT: Pupils equal, extraocular movements intact  Respiratory: Patient's speak in full sentences and does not appear short of breath  Cardiovascular: No lower extremity edema, non tender, no erythema  Skin: Warm dry intact with no signs of infection or rash on  extremities or on axial skeleton.  Abdomen: Soft nontender  Neuro: Cranial nerves II through XII are intact, neurovascularly intact in all extremities with 2+ DTRs and 2+ pulses.  Lymph: No lymphadenopathy of posterior or anterior cervical chain or axillae bilaterally.  Gait normal with good balance and coordination.  MSK:  Non tender with full range of motion and good stability and symmetric strength and tone of , elbows, wrist,  knee and ankles bilaterally.  [id number]  ROM IR: 25  Deg, ER: 35 Deg, Flexion: 120 Deg, Extension: 100 Deg, Abduction: 45 Deg, Adduction: 25 Deg Strength IR: 5/5, ER: 5/5, Flexion: 5/5, Extension: 5/5, Abduction: 5/5, Adduction: 5/5 Pelvic alignment unremarkable to inspection and palpation. Standing hip rotation and gait without trendelenburg sign / unsteadiness. Greater trochanter without tenderness to palpation. No tenderness over piriformis and greater trochanter. No pain with FABER or FADIR. No SI joint tenderness and normal minimal SI movement.  Shoulder: Right Inspection reveals no abnormalities, atrophy or asymmetry. Palpation is normal with no tenderness over AC joint or bicipital groove. ROM is full in all planes. Rotator cuff strength normal throughout. Positive impingement Speeds and Yergason's tests normal. No labral pathology noted with negative Obrien's, negative clunk and good stability. Normal scapular function observed. No painful arc and no drop arm sign. No apprehension sign      Impression and Recommendations:     The above documentation has been reviewed and is accurate and complete Lyndal Pulley, DO       Note: This dictation was prepared with Dragon dictation along with smaller phrase technology. Any transcriptional errors that result from this process are unintentional.

## 2019-05-15 ENCOUNTER — Ambulatory Visit (INDEPENDENT_AMBULATORY_CARE_PROVIDER_SITE_OTHER): Payer: Medicare HMO | Admitting: Family Medicine

## 2019-05-15 ENCOUNTER — Other Ambulatory Visit: Payer: Self-pay

## 2019-05-15 ENCOUNTER — Encounter: Payer: Self-pay | Admitting: Family Medicine

## 2019-05-15 DIAGNOSIS — M7551 Bursitis of right shoulder: Secondary | ICD-10-CM | POA: Diagnosis not present

## 2019-05-15 DIAGNOSIS — M7062 Trochanteric bursitis, left hip: Secondary | ICD-10-CM

## 2019-05-15 NOTE — Patient Instructions (Signed)
Good to see you See me when you need me  4,000 IU vitamin d daily from here on out

## 2019-05-15 NOTE — Assessment & Plan Note (Signed)
Patient is doing much better at this time.  No pain whatsoever.  Feels like she has made significant just follow-up as needed

## 2019-05-15 NOTE — Assessment & Plan Note (Signed)
100% better at this time.  No significant discomfort either.  Doing very well.  Follow-up as not

## 2019-05-28 DIAGNOSIS — Z124 Encounter for screening for malignant neoplasm of cervix: Secondary | ICD-10-CM | POA: Diagnosis not present

## 2019-05-28 DIAGNOSIS — Z6828 Body mass index (BMI) 28.0-28.9, adult: Secondary | ICD-10-CM | POA: Diagnosis not present

## 2019-05-28 DIAGNOSIS — Z01419 Encounter for gynecological examination (general) (routine) without abnormal findings: Secondary | ICD-10-CM | POA: Diagnosis not present

## 2019-05-28 DIAGNOSIS — Z9889 Other specified postprocedural states: Secondary | ICD-10-CM | POA: Diagnosis not present

## 2019-05-28 LAB — HM PAP SMEAR: HM Pap smear: NORMAL

## 2019-06-12 ENCOUNTER — Encounter: Payer: Self-pay | Admitting: Family Medicine

## 2019-07-06 ENCOUNTER — Telehealth: Payer: Self-pay | Admitting: Family Medicine

## 2019-07-06 NOTE — Telephone Encounter (Signed)
I left a message asking the patient and spouse to call and schedule Medicare AWV with Courtney ([facility] Health Coach).  If patient calls back, please schedule Medicare Wellness Visit (initial) at next available opening.  VDM (Dee-Dee) °

## 2019-07-12 ENCOUNTER — Other Ambulatory Visit: Payer: Self-pay

## 2019-07-12 DIAGNOSIS — R3121 Asymptomatic microscopic hematuria: Secondary | ICD-10-CM | POA: Diagnosis not present

## 2019-07-12 DIAGNOSIS — N8111 Cystocele, midline: Secondary | ICD-10-CM | POA: Diagnosis not present

## 2019-07-13 ENCOUNTER — Ambulatory Visit (INDEPENDENT_AMBULATORY_CARE_PROVIDER_SITE_OTHER): Payer: Medicare HMO

## 2019-07-13 DIAGNOSIS — Z0000 Encounter for general adult medical examination without abnormal findings: Secondary | ICD-10-CM

## 2019-07-13 NOTE — Progress Notes (Signed)
This visit is being conducted via phone call due to the COVID-19 pandemic. This patient has given me verbal consent via phone to conduct this visit, patient states they are participating from their home address. Some vital signs may be absent or patient reported.   Patient identification: identified by name, DOB, and current address.  Location provider: [facility], Office Persons participating in the virtual visit: Denman George LPN , patient, and Dr. Garret Reddish     Subjective:   Nicole Dawson is a 68 y.o. female who presents for an Initial Medicare Annual Wellness Visit.  Review of Systems     Cardiac Risk Factors include: advanced age (>85men, >52 women);dyslipidemia    Objective:    There were no vitals filed for this visit. There is no height or weight on file to calculate BMI.  Advanced Directives 07/13/2019 07/08/2015  Does Patient Have a Medical Advance Directive? Yes No  Type of Advance Directive Living will;Healthcare Power of Attorney -  Does patient want to make changes to medical advance directive? No - Patient declined -  Copy of [place] in Chart? No - copy requested -  Would patient like information on creating a medical advance directive? - No - patient declined information    Current Medications (verified) Outpatient Encounter Medications as of 07/13/2019  Medication Sig  . aspirin EC 81 MG tablet Take 81 mg by mouth at bedtime.   Marland Kitchen atorvastatin (LIPITOR) 20 MG tablet TAKE 1 TABLET BY MOUTH TWICE A WEEK  . Biotin 10000 MCG TABS Take 1 tablet by mouth daily.  . Carboxymethylcellul-Glycerin (LUBRICATING EYE DROPS OP) Apply 1 drop to eye as needed (dry eyes).   . cetirizine (ZYRTEC) 10 MG tablet Take 10 mg by mouth daily as needed for allergies.   . cholecalciferol (VITAMIN D) 1000 units tablet Take 1,000 Units by mouth 2 (two) times daily at 8 am and 10 pm.  . Coenzyme Q10 (CO Q-10) 100 MG CAPS Take by mouth daily.   .  cyanocobalamin 1000 MCG tablet Take 1,000 mcg by mouth daily.  Marland Kitchen gabapentin (NEURONTIN) 100 MG capsule TAKE 1 TO 3 CAPSULES BY MOUTH THREE TIMES DAILY  . ketotifen (ZADITOR) 0.025 % ophthalmic solution Place 1 drop into both eyes daily.  . montelukast (SINGULAIR) 10 MG tablet Take 10 mg by mouth at bedtime.  . Multiple Vitamin (MULTIVITAMIN) tablet Take 1 tablet by mouth daily.    Marland Kitchen OVER THE COUNTER MEDICATION Caltrate  . triamcinolone (NASACORT AQ) 55 MCG/ACT AERO nasal inhaler Place 2 sprays into the nose daily.  . valACYclovir (VALTREX) 500 MG tablet Take 500 mg by mouth at bedtime.    No facility-administered encounter medications on file as of 07/13/2019.    Allergies (verified) Patient has no known allergies.   History: Past Medical History:  Diagnosis Date  . Allergy   . Breast cancer ([place])   . Depression   . Diverticulitis   . Diverticulosis   . Genital herpes   . Heart murmur   . HLD (hyperlipidemia)   . Hx of tobacco use, presenting hazards to health 07/08/2015   22 years at 1 PPD  . IBS (irritable bowel syndrome) 07/08/2015   Diagnosis by Dr. Conley Canal her prior PCP  . PONV (postoperative nausea and vomiting)   . Sigmoid diverticulitis 07/08/2015   Past Surgical History:  Procedure Laterality Date  . APPENDECTOMY  1962  . BILATERAL TOTAL MASTECTOMY WITH AXILLARY LYMPH NODE DISSECTION  2005   bilateral  mastectomy  . BREAST RECONSTRUCTION  2005  . TUBAL LIGATION  1984   Family History  Problem Relation Age of Onset  . Hyperlipidemia Mother   . Hypertension Mother   . COPD Mother   . Stroke Mother        in 33s.   Marland Kitchen Heart attack Father        31.   . Tuberculosis Father        1/2 of lung removed.   . Hyperlipidemia Father   . Lymphoma Maternal Aunt   . Skin cancer Paternal Grandmother    Social History   Socioeconomic History  . Marital status: Married    Spouse name: Not on file  . Number of children: 2  . Years of education: Not on file  . Highest  education level: Not on file  Occupational History  . Occupation: CNA  Tobacco Use  . Smoking status: Former Smoker    Packs/day: 1.00    Years: 30.00    Pack years: 30.00    Types: Cigarettes    Quit date: 07/07/1992    Years since quitting: 27.0  . Smokeless tobacco: Never Used  Substance and Sexual Activity  . Alcohol use: No  . Drug use: No  . Sexual activity: Not on file  Other Topics Concern  . Not on file  Social History Narrative   Lives in [place], previously lived in [place] until 2020. Married 2007. 2 kids (son and daughter). 2 grandkids (granddaughter and grandson). Both in [place]. Grandson on on the way (son's) in April 2016.       Works at [employer]- a branch of [employer].    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       Hobbies: computer games, tv   Social Determinants of Health   Financial Resource Strain:   . Difficulty of Paying Living Expenses: Not on file  Food Insecurity:   . Worried About Charity fundraiser in the Last Year: Not on file  . Ran Out of Food in the Last Year: Not on file  Transportation Needs:   . Lack of Transportation (Medical): Not on file  . Lack of Transportation (Non-Medical): Not on file  Physical Activity:   . Days of Exercise per Week: Not on file  . Minutes of Exercise per Session: Not on file  Stress:   . Feeling of Stress : Not on file  Social Connections:   . Frequency of Communication with Friends and Family: Not on file  . Frequency of Social Gatherings with Friends and Family: Not on file  . Attends Religious Services: Not on file  . Active Member of Clubs or Organizations: Not on file  . Attends Archivist Meetings: Not on file  . Marital Status: Not on file    Tobacco Counseling Counseling given: Not Answered   Clinical Intake:  Pre-visit preparation completed: Yes  Pain : No/denies pain  Diabetes: No  How often do you need to have someone help you  when you read instructions, pamphlets, or other written materials from your doctor or pharmacy?: 1 - Never  Interpreter Needed?: No  Information entered by :: Denman George LPN   Activities of Daily Living In your present state of health, do you have any difficulty performing the following activities: 07/13/2019  Hearing? N  Vision? N  Difficulty concentrating or making decisions? N  Walking or climbing stairs? N  Dressing or bathing? N  Doing errands, shopping? N  Preparing Food and eating ? N  Using the Toilet? N  In the past six months, have you accidently leaked urine? N  Do you have problems with loss of bowel control? N  Managing your Medications? N  Managing your Finances? N  Housekeeping or managing your Housekeeping? N  Some recent data might be hidden     Immunizations and Health Maintenance Immunization History  Administered Date(s) Administered  . Fluad Quad(high Dose 65+) 04/13/2019  . Hepatitis B 04/02/2013  . Influenza,inj,Quad PF,6+ Mos 05/29/2018  . Influenza-Unspecified 04/02/2013, 05/02/2014, 06/11/2014, 06/18/2015, 06/02/2016, 03/28/2017  . Pneumococcal Conjugate-13 03/24/2018  . Pneumococcal Polysaccharide-23 04/13/2019  . Td 05/28/2009  . Zoster 07/27/2016   Health Maintenance Due  Topic Date Due  . TETANUS/TDAP  05/29/2019    Patient Care Team: Marin Olp, MD as PCP - General (Family Medicine) Vania Rea, MD as Consulting Physician (Obstetrics and Gynecology) Lyndal Pulley, DO as Consulting Physician (Sports Medicine) [facility] as Consulting Physician Nechama Guard, MD as Consulting Physician (Plastic Surgery) Sheilah Mins, MD as Consulting Physician (Surgical Oncology) Tiajuana Amass, MD as Consulting Physician (Allergy and Immunology)  Indicate any recent Medical Services you may have received from other than [facility] providers in the past year (date may be approximate).     Assessment:   This is a  routine wellness examination for [facility].  Hearing/Vision screen No exam data present  Dietary issues and exercise activities discussed: Current Exercise Habits: The patient has a physically strenuous job, but has no regular exercise apart from work.  Goals   None    Depression Screen PHQ 2/9 Scores 07/13/2019 04/13/2019 02/23/2019 04/06/2018 03/24/2018 03/24/2018  PHQ - 2 Score 0 0 0 0 0 0  PHQ- 9 Score - 2 - - - -    Fall Risk Fall Risk  07/13/2019 04/13/2019 04/06/2018  Falls in the past year? 0 1 No  Number falls in past yr: - 0 -  Injury with Fall? 0 1 -  Follow up Falls evaluation completed;Education provided;Falls prevention discussed - -    Is the patient's home free of loose throw rugs in walkways, pet beds, electrical cords, etc?   yes      Grab bars in the bathroom? yes      Handrails on the stairs?   yes      Adequate lighting?   yes  Cognitive Function:no cognitive concerns at this time    6CIT Screen 07/13/2019  What Year? 0 points  What month? 0 points  What time? 0 points  Count back from 20 0 points  Months in reverse 0 points  Repeat phrase 0 points  Total Score 0    Screening Tests Health Maintenance  Topic Date Due  . TETANUS/TDAP  05/29/2019  . COLONOSCOPY  11/25/2025  . INFLUENZA VACCINE  Completed  . DEXA SCAN  Completed  . Hepatitis C Screening  Completed  . PNA vac Low Risk Adult  Completed    Qualifies for Shingles Vaccine? Discussed and patient will check with pharmacy for coverage.  Patient education handout provided    Cancer Screenings: Lung: Low Dose CT Chest recommended if Age 21-80 years, 30 pack-year currently smoking OR have quit w/in 15years. Patient does not qualify. Breast: Up to date on Mammogram? Yes   Up to date of Bone Density/Dexa? Yes Colorectal: colonoscopy 11/06/15 with Dr. Havery Moros    Plan:  I have personally reviewed and addressed the  Medicare Annual Wellness questionnaire and have noted the following in the  patient's chart:  A. Medical and social history B. Use of alcohol, tobacco or illicit drugs  C. Current medications and supplements D. Functional ability and status E.  Nutritional status F.  Physical activity G. Advance directives H. List of other physicians I.  Hospitalizations, surgeries, and ER visits in previous 12 months J.  [place] such as hearing and vision if needed, cognitive and depression L. Referrals, records requested, and appointments- none   In addition, I have reviewed and discussed with patient certain preventive protocols, quality metrics, and best practice recommendations. A written personalized care plan for preventive services as well as general preventive health recommendations were provided to patient.   Signed,  Denman George, LPN  Nurse Health Advisor   Nurse Notes:no additional

## 2019-07-13 NOTE — Patient Instructions (Signed)
Nicole Dawson , Thank you for taking time to come for your Medicare Wellness Visit. I appreciate your ongoing commitment to your health goals. Please review the following plan we discussed and let me know if I can assist you in the future.   Screening recommendations/referrals: Colorectal Screening: up to date; last colonoscopy 11/26/15  Mammogram: up to date; no longer indicated Bone Density: up to date; last 01/19/18  Vision and Dental Exams: Recommended annual ophthalmology exams for early detection of glaucoma and other disorders of the eye Recommended annual dental exams for proper oral hygiene  Vaccinations: Influenza vaccine: completed 04/13/19 Pneumococcal vaccine: up to date; last 04/13/19 (Pneumovax)  Tdap vaccine: recommended; last 05/28/09  Shingles vaccine: Please call your insurance company to determine your out of pocket expense for the Shingrix vaccine. You may receive this vaccine at your local pharmacy.  Advanced directives: Please bring a copy of your POA (Power of Attorney) and/or Living Will to your next appointment.  Goals: Recommend to drink at least 6-8 8oz glasses of water per day and consume a balanced diet rich in fresh fruits and vegetables.   Next appointment: Please schedule your Annual Wellness Visit with your Nurse Health Advisor in one year.  Preventive Care 46 Years and Older, Female Preventive care refers to lifestyle choices and visits with your health care provider that can promote health and wellness. What does preventive care include?  A yearly physical exam. This is also called an annual well check.  Dental exams once or twice a year.  Routine eye exams. Ask your health care provider how often you should have your eyes checked.  Personal lifestyle choices, including:  Daily care of your teeth and gums.  Regular physical activity.  Eating a healthy diet.  Avoiding tobacco and drug use.  Limiting alcohol use.  Practicing safe  sex.  Taking low-dose aspirin every day if recommended by your health care provider.  Taking vitamin and mineral supplements as recommended by your health care provider. What happens during an annual well check? The services and screenings done by your health care provider during your annual well check will depend on your age, overall health, lifestyle risk factors, and family history of disease. Counseling  Your health care provider may ask you questions about your:  Alcohol use.  Tobacco use.  Drug use.  Emotional well-being.  Home and relationship well-being.  Sexual activity.  Eating habits.  History of falls.  Memory and ability to understand (cognition).  Work and work Statistician.  Reproductive health. Screening  You may have the following tests or measurements:  Height, weight, and BMI.  Blood pressure.  Lipid and cholesterol levels. These may be checked every 5 years, or more frequently if you are over 57 years old.  Skin check.  Lung cancer screening. You may have this screening every year starting at age 24 if you have a 30-pack-year history of smoking and currently smoke or have quit within the past 15 years.  Fecal occult blood test (FOBT) of the stool. You may have this test every year starting at age 73.  Flexible sigmoidoscopy or colonoscopy. You may have a sigmoidoscopy every 5 years or a colonoscopy every 10 years starting at age 75.  Hepatitis C blood test.  Hepatitis B blood test.  Sexually transmitted disease (STD) testing.  Diabetes screening. This is done by checking your blood sugar (glucose) after you have not eaten for a while (fasting). You may have this done every 1-3 years.  Bone  density scan. This is done to screen for osteoporosis. You may have this done starting at age 28.  Mammogram. This may be done every 1-2 years. Talk to your health care provider about how often you should have regular mammograms. Talk with your health  care provider about your test results, treatment options, and if necessary, the need for more tests. Vaccines  Your health care provider may recommend certain vaccines, such as:  Influenza vaccine. This is recommended every year.  Tetanus, diphtheria, and acellular pertussis (Tdap, Td) vaccine. You may need a Td booster every 10 years.  Zoster vaccine. You may need this after age 56.  Pneumococcal 13-valent conjugate (PCV13) vaccine. One dose is recommended after age 81.  Pneumococcal polysaccharide (PPSV23) vaccine. One dose is recommended after age 47. Talk to your health care provider about which screenings and vaccines you need and how often you need them. This information is not intended to replace advice given to you by your health care provider. Make sure you discuss any questions you have with your health care provider. Document Released: 08/15/2015 Document Revised: 04/07/2016 Document Reviewed: 05/20/2015 Elsevier Interactive Patient Education  2017 [place] Prevention in the Home Falls can cause injuries. They can happen to people of all ages. There are many things you can do to make your home safe and to help prevent falls. What can I do on the outside of my home?  Regularly fix the edges of walkways and driveways and fix any cracks.  Remove anything that might make you trip as you walk through a door, such as a raised step or threshold.  Trim any bushes or trees on the path to your home.  Use bright outdoor lighting.  Clear any walking paths of anything that might make someone trip, such as rocks or tools.  Regularly check to see if handrails are loose or broken. Make sure that both sides of any steps have handrails.  Any raised decks and porches should have guardrails on the edges.  Have any leaves, snow, or ice cleared regularly.  Use sand or salt on walking paths during winter.  Clean up any spills in your garage right away. This includes oil or  grease spills. What can I do in the bathroom?  Use night lights.  Install grab bars by the toilet and in the tub and shower. Do not use towel bars as grab bars.  Use non-skid mats or decals in the tub or shower.  If you need to sit down in the shower, use a plastic, non-slip stool.  Keep the floor dry. Clean up any water that spills on the floor as soon as it happens.  Remove soap buildup in the tub or shower regularly.  Attach bath mats securely with double-sided non-slip rug tape.  Do not have throw rugs and other things on the floor that can make you trip. What can I do in the bedroom?  Use night lights.  Make sure that you have a light by your bed that is easy to reach.  Do not use any sheets or blankets that are too big for your bed. They should not hang down onto the floor.  Have a firm chair that has side arms. You can use this for support while you get dressed.  Do not have throw rugs and other things on the floor that can make you trip. What can I do in the kitchen?  Clean up any spills right away.  Avoid walking on  wet floors.  Keep items that you use a lot in easy-to-reach places.  If you need to reach something above you, use a strong step stool that has a grab bar.  Keep electrical cords out of the way.  Do not use floor polish or wax that makes floors slippery. If you must use wax, use non-skid floor wax.  Do not have throw rugs and other things on the floor that can make you trip. What can I do with my stairs?  Do not leave any items on the stairs.  Make sure that there are handrails on both sides of the stairs and use them. Fix handrails that are broken or loose. Make sure that handrails are as long as the stairways.  Check any carpeting to make sure that it is firmly attached to the stairs. Fix any carpet that is loose or worn.  Avoid having throw rugs at the top or bottom of the stairs. If you do have throw rugs, attach them to the floor with  carpet tape.  Make sure that you have a light switch at the top of the stairs and the bottom of the stairs. If you do not have them, ask someone to add them for you. What else can I do to help prevent falls?  Wear shoes that:  Do not have high heels.  Have rubber bottoms.  Are comfortable and fit you well.  Are closed at the toe. Do not wear sandals.  If you use a stepladder:  Make sure that it is fully opened. Do not climb a closed stepladder.  Make sure that both sides of the stepladder are locked into place.  Ask someone to hold it for you, if possible.  Clearly mark and make sure that you can see:  Any grab bars or handrails.  First and last steps.  Where the edge of each step is.  Use tools that help you move around (mobility aids) if they are needed. These include:  Canes.  Walkers.  Scooters.  Crutches.  Turn on the lights when you go into a dark area. Replace any light bulbs as soon as they burn out.  Set up your furniture so you have a clear path. Avoid moving your furniture around.  If any of your floors are uneven, fix them.  If there are any pets around you, be aware of where they are.  Review your medicines with your doctor. Some medicines can make you feel dizzy. This can increase your chance of falling. Ask your doctor what other things that you can do to help prevent falls. This information is not intended to replace advice given to you by your health care provider. Make sure you discuss any questions you have with your health care provider. Document Released: 05/15/2009 Document Revised: 12/25/2015 Document Reviewed: 08/23/2014 Elsevier Interactive Patient Education  2017 [employer].

## 2019-07-20 DIAGNOSIS — R3129 Other microscopic hematuria: Secondary | ICD-10-CM | POA: Diagnosis not present

## 2019-07-20 DIAGNOSIS — R3121 Asymptomatic microscopic hematuria: Secondary | ICD-10-CM | POA: Diagnosis not present

## 2019-07-20 DIAGNOSIS — K5730 Diverticulosis of large intestine without perforation or abscess without bleeding: Secondary | ICD-10-CM | POA: Diagnosis not present

## 2019-09-05 ENCOUNTER — Other Ambulatory Visit: Payer: Self-pay

## 2019-09-05 ENCOUNTER — Encounter: Payer: Self-pay | Admitting: Family Medicine

## 2019-09-05 ENCOUNTER — Ambulatory Visit (INDEPENDENT_AMBULATORY_CARE_PROVIDER_SITE_OTHER): Payer: Medicare HMO

## 2019-09-05 ENCOUNTER — Ambulatory Visit (INDEPENDENT_AMBULATORY_CARE_PROVIDER_SITE_OTHER): Payer: Medicare HMO | Admitting: Family Medicine

## 2019-09-05 VITALS — BP 126/82 | HR 65 | Ht 61.0 in | Wt 158.0 lb

## 2019-09-05 DIAGNOSIS — G8929 Other chronic pain: Secondary | ICD-10-CM

## 2019-09-05 DIAGNOSIS — M25511 Pain in right shoulder: Secondary | ICD-10-CM

## 2019-09-05 DIAGNOSIS — M19011 Primary osteoarthritis, right shoulder: Secondary | ICD-10-CM | POA: Diagnosis not present

## 2019-09-05 DIAGNOSIS — M7551 Bursitis of right shoulder: Secondary | ICD-10-CM | POA: Diagnosis not present

## 2019-09-05 NOTE — Progress Notes (Signed)
[place] Phone: [phone] Subjective:   [name], am serving as a scribe for Dr. Hulan Saas. This visit occurred during the SARS-CoV-2 public health emergency.  Safety protocols were in place, including screening questions prior to the visit, additional usage of staff PPE, and extensive cleaning of exam room while observing appropriate contact time as indicated for disinfecting solutions.   I'm seeing this patient by the request  of:  Marin Olp, MD  CC: Shoulder pain follow-up  [id number]   05/15/2019 Patient is doing much better at this time.  No pain whatsoever.  Feels like she has made significant just follow-up as needed  Update 09/05/2019 Nicole Dawson is a 69 y.o. female coming in with complaint of left hip pain. Patient states that her hips are doing better. No issue at this time.  Is having right shoulder pain. Last injected on 04/20/2019. Complains of pain that can radiate down the arm into her thumb. Horizontal adduction increases her pain as well as lying on her shoulder when sleeping.  Patient states that it feels relatively the same.  Having some mild pain with laying on the side of the shoulder as well.  Denies any significant weakness.  Rates the severity of pain a 7-10.    Past Medical History:  Diagnosis Date  . Allergy   . Breast cancer ([place])   . Depression   . Diverticulitis   . Diverticulosis   . Genital herpes   . Heart murmur   . HLD (hyperlipidemia)   . Hx of tobacco use, presenting hazards to health 07/08/2015   22 years at 1 PPD  . IBS (irritable bowel syndrome) 07/08/2015   Diagnosis by Dr. Conley Canal her prior PCP  . PONV (postoperative nausea and vomiting)   . Sigmoid diverticulitis 07/08/2015   Past Surgical History:  Procedure Laterality Date  . APPENDECTOMY  1962  . BILATERAL TOTAL MASTECTOMY WITH AXILLARY LYMPH NODE DISSECTION  2005   bilateral  mastectomy  . BREAST RECONSTRUCTION  2005  . TUBAL LIGATION  1984   Social History   Socioeconomic History  . Marital status: Married    Spouse name: Not on file  . Number of children: 2  . Years of education: Not on file  . Highest education level: Not on file  Occupational History  . Occupation: CNA  Tobacco Use  . Smoking status: Former Smoker    Packs/day: 1.00    Years: 30.00    Pack years: 30.00    Types: Cigarettes    Quit date: 07/07/1992    Years since quitting: 27.1  . Smokeless tobacco: Never Used  Substance and Sexual Activity  . Alcohol use: No  . Drug use: No  . Sexual activity: Not on file  Other Topics Concern  . Not on file  Social History Narrative   Lives in [place], previously lived in [place] until 2020. Married 2007. 2 kids (son and daughter). 2 grandkids (granddaughter and grandson). Both in [place]. Grandson on on the way (son's) in April 2016.       Works at [employer]- a branch of [employer].    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       Hobbies: computer games, tv   Social Determinants of Health   Financial Resource Strain:   . Difficulty of Paying Living Expenses: Not on file  Food Insecurity:   . Worried About  Running Out of Food in the Last Year: Not on file  . Ran Out of Food in the Last Year: Not on file  Transportation Needs:   . Lack of Transportation (Medical): Not on file  . Lack of Transportation (Non-Medical): Not on file  Physical Activity:   . Days of Exercise per Week: Not on file  . Minutes of Exercise per Session: Not on file  Stress:   . Feeling of Stress : Not on file  Social Connections:   . Frequency of Communication with Friends and Family: Not on file  . Frequency of Social Gatherings with Friends and Family: Not on file  . Attends Religious Services: Not on file  . Active Member of Clubs or Organizations: Not on file  . Attends Archivist Meetings: Not on file    . Marital Status: Not on file   No Known Allergies Family History  Problem Relation Age of Onset  . Hyperlipidemia Mother   . Hypertension Mother   . COPD Mother   . Stroke Mother        in 53s.   Marland Kitchen Heart attack Father        6.   . Tuberculosis Father        1/2 of lung removed.   . Hyperlipidemia Father   . Lymphoma Maternal Aunt   . Skin cancer Paternal Grandmother      Current Outpatient Medications (Cardiovascular):  .  atorvastatin (LIPITOR) 20 MG tablet, TAKE 1 TABLET BY MOUTH TWICE A WEEK  Current Outpatient Medications (Respiratory):  .  cetirizine (ZYRTEC) 10 MG tablet, Take 10 mg by mouth daily as needed for allergies.  .  montelukast (SINGULAIR) 10 MG tablet, Take 10 mg by mouth at bedtime. .  triamcinolone (NASACORT AQ) 55 MCG/ACT AERO nasal inhaler, Place 2 sprays into the nose daily.  Current Outpatient Medications (Analgesics):  .  aspirin EC 81 MG tablet, Take 81 mg by mouth at bedtime.   Current Outpatient Medications (Hematological):  .  cyanocobalamin 1000 MCG tablet, Take 1,000 mcg by mouth daily.  Current Outpatient Medications (Other):  .  Biotin 10000 MCG TABS, Take 1 tablet by mouth daily. .  Carboxymethylcellul-Glycerin (LUBRICATING EYE DROPS OP), Apply 1 drop to eye as needed (dry eyes).  .  cholecalciferol (VITAMIN D) 1000 units tablet, Take 1,000 Units by mouth 2 (two) times daily at 8 am and 10 pm. .  Coenzyme Q10 (CO Q-10) 100 MG CAPS, Take by mouth daily.  Marland Kitchen  gabapentin (NEURONTIN) 100 MG capsule, TAKE 1 TO 3 CAPSULES BY MOUTH THREE TIMES DAILY .  ketotifen (ZADITOR) 0.025 % ophthalmic solution, Place 1 drop into both eyes daily. .  Multiple Vitamin (MULTIVITAMIN) tablet, Take 1 tablet by mouth daily.   Marland Kitchen  OVER THE COUNTER MEDICATION, Caltrate .  valACYclovir (VALTREX) 500 MG tablet, Take 500 mg by mouth at bedtime.    Reviewed prior external information including notes and imaging from  primary care provider As well as notes that  were available from care everywhere and other healthcare systems.  Past medical history, social, surgical and family history all reviewed in electronic medical record.  No pertanent information unless stated regarding to the chief complaint.   Review of Systems:  No headache, visual changes, nausea, vomiting, diarrhea, constipation, dizziness, abdominal pain, skin rash, fevers, chills, night sweats, weight loss, swollen lymph nodes, body aches, joint swelling, chest pain, shortness of breath, mood changes. POSITIVE muscle aches  Objective  Blood pressure  126/82, pulse 65, height 5\' 1"  (1.549 m), weight 158 lb (71.7 kg), SpO2 98 %.   General: No apparent distress alert and oriented x3 mood and affect normal, dressed appropriately.  HEENT: Pupils equal, extraocular movements intact  Respiratory: Patient's speak in full sentences and does not appear short of breath  Cardiovascular: No lower extremity edema, non tender, no erythema  Skin: Warm dry intact with no signs of infection or rash on extremities or on axial skeleton.  Abdomen: Soft nontender  Neuro: Cranial nerves II through XII are intact, neurovascularly intact in all extremities with 2+ DTRs and 2+ pulses.  Lymph: No lymphadenopathy of posterior or anterior cervical chain or axillae bilaterally.  Gait normal with good balance and coordination.  MSK: Moderate arthritic changes from patient's age  Right shoulder exam shows the patient's rotator cuff strength is 4 out of 5 compared to the contralateral side.  Patient is tender to palpation diffusely.  Positive crossover and pain over the acromioclavicular joint  Procedure: Real-time Ultrasound Guided Injection of right glenohumeral joint Device: GE Logiq Q7  Ultrasound guided injection is preferred based studies that show increased duration, increased effect, greater accuracy, decreased procedural pain, increased response rate with ultrasound guided versus blind injection.  Verbal  informed consent obtained.  Time-out conducted.  Noted no overlying erythema, induration, or other signs of local infection.  Skin prepped in a sterile fashion.  Local anesthesia: Topical Ethyl chloride.  With sterile technique and under real time ultrasound guidance:  Joint visualized.  23g 1  inch needle inserted posterior approach. Pictures taken for needle placement. Patient did have injection of 2 cc of 1% lidocaine, 2 cc of 0.5% Marcaine, and 1.0 cc of Kenalog 40 mg/dL. Completed without difficulty  Pain immediately resolved suggesting accurate placement of the medication.  Advised to call if fevers/chills, erythema, induration, drainage, or persistent bleeding.  Images permanently stored and available for review in the ultrasound unit.  Impression: Technically successful ultrasound guided injection.  Procedure: Real-time Ultrasound Guided Injection of right acromioclavicular joint Device: GE Logiq Q7 Ultrasound guided injection is preferred based studies that show increased duration, increased effect, greater accuracy, decreased procedural pain, increased response rate, and decreased cost with ultrasound guided versus blind injection.  Verbal informed consent obtained.  Time-out conducted.  Noted no overlying erythema, induration, or other signs of local infection.  Skin prepped in a sterile fashion.  Local anesthesia: Topical Ethyl chloride.  With sterile technique and under real time ultrasound guidance: With a 25-gauge half inch needle injected with 0.5 cc of 0.5% Marcaine and 0.5 cc of Kenalog 40 mg/mL Completed without difficulty  Pain immediately resolved suggesting accurate placement of the medication.  Advised to call if fevers/chills, erythema, induration, drainage, or persistent bleeding.  Images permanently stored and available for review in the ultrasound unit.  Impression: Technically successful ultrasound guided injection.    Impression and Recommendations:      This case required medical decision making of moderate complexity. The above documentation has been reviewed and is accurate and complete Nicole Pulley, DO       Note: This dictation was prepared with Dragon dictation along with smaller phrase technology. Any transcriptional errors that result from this process are unintentional.

## 2019-09-05 NOTE — Patient Instructions (Signed)
Injected shoulder and AC Start exercises on Monday 3x a week Ice after work  See me again in 6 weeks

## 2019-09-05 NOTE — Assessment & Plan Note (Signed)
Newer problem.  Given injection.  Seems to be more symptomatic now compared to previous exam.  Discussed icing regimen and home exercise, which activities to doing which wants to avoid.  Patient will increase activity slowly.  Follow-up again in 4 to 6 weeks

## 2019-09-05 NOTE — Assessment & Plan Note (Signed)
Exacerbation of chronic disease with worsening symptoms.  Injection given again today.  We discussed the possibility of advanced imaging with patient's history of also breast cancer.  Patient wants to hold at this moment.  Discussed which activities to do which wants to avoid.  Patient will increase activity slowly.  Patient will follow up with me again in 4 to 6 weeks.

## 2019-09-23 ENCOUNTER — Other Ambulatory Visit: Payer: Self-pay | Admitting: Family Medicine

## 2019-09-24 ENCOUNTER — Telehealth: Payer: Self-pay

## 2019-09-24 NOTE — Telephone Encounter (Signed)
Medication sent in. 

## 2019-09-24 NOTE — Telephone Encounter (Signed)
MEDICATION: gabapentin (NEURONTIN) 100 MG capsule  PHARMACY:  [id number] - [place], [place] - [address] Phone:  [phone]  Fax:  [phone]      Comments:   **Let patient know to contact pharmacy at the end of the day to make sure medication is ready. **  ** Please notify patient to allow 48-72 hours to process**  **Encourage patient to contact the pharmacy for refills or they can request refills through MY[facility]ART**  

## 2019-10-01 ENCOUNTER — Other Ambulatory Visit: Payer: Self-pay | Admitting: Family Medicine

## 2019-10-18 ENCOUNTER — Ambulatory Visit: Payer: Self-pay

## 2019-10-18 ENCOUNTER — Ambulatory Visit (INDEPENDENT_AMBULATORY_CARE_PROVIDER_SITE_OTHER): Payer: Medicare HMO | Admitting: Family Medicine

## 2019-10-18 ENCOUNTER — Encounter: Payer: Self-pay | Admitting: Family Medicine

## 2019-10-18 ENCOUNTER — Other Ambulatory Visit: Payer: Self-pay

## 2019-10-18 VITALS — BP 110/70 | HR 77 | Ht 61.0 in | Wt 161.0 lb

## 2019-10-18 DIAGNOSIS — M75111 Incomplete rotator cuff tear or rupture of right shoulder, not specified as traumatic: Secondary | ICD-10-CM | POA: Diagnosis not present

## 2019-10-18 DIAGNOSIS — M25511 Pain in right shoulder: Secondary | ICD-10-CM | POA: Diagnosis not present

## 2019-10-18 DIAGNOSIS — G8929 Other chronic pain: Secondary | ICD-10-CM | POA: Diagnosis not present

## 2019-10-18 DIAGNOSIS — M75101 Unspecified rotator cuff tear or rupture of right shoulder, not specified as traumatic: Secondary | ICD-10-CM | POA: Insufficient documentation

## 2019-10-18 MED ORDER — NITROGLYCERIN 0.1 MG/HR TD PT24: MEDICATED_PATCH | TRANSDERMAL | 12 refills | Status: DC

## 2019-10-18 NOTE — Progress Notes (Signed)
[place] Phone: [phone] Subjective:   Nicole Dawson, am serving as a scribe for Dr. Hulan Saas. This visit occurred during the SARS-CoV-2 public health emergency.  Safety protocols were in place, including screening questions prior to the visit, additional usage of staff PPE, and extensive cleaning of exam room while observing appropriate contact time as indicated for disinfecting solutions.   I'm seeing this patient by the request  of:  Marin Olp, MD  CC: Right shoulder pain  [id number]   09/05/2019 Exacerbation of chronic disease with worsening symptoms.  Injection given again today.  We discussed the possibility of advanced imaging with patient's history of also breast cancer.  Patient wants to hold at this moment.  Discussed which activities to do which wants to avoid.  Patient will increase activity slowly.  Patient will follow up with me again in 4 to 6 weeks.  Newer problem.  Given injection.  Seems to be more symptomatic now compared to previous exam.  Discussed icing regimen and home exercise, which activities to doing which wants to avoid.  Patient will increase activity slowly.  Follow-up again in 4 to 6 weeks  Update 10/18/2019 Nicole Dawson is a 69 y.o. female coming in with complaint of right shoulder pain. Patient states that the injections did help for a short time. Pain is increasing but is not as bad as it was. Pain over posterior aspect of shoulder. Dawson longer taking any pain medications.  Patient states that may be admitting some very minimal improvement previously.     Past Medical History:  Diagnosis Date  . Allergy   . Breast cancer ([place])   . Depression   . Diverticulitis   . Diverticulosis   . Genital herpes   . Heart murmur   . HLD (hyperlipidemia)   . Hx of tobacco use, presenting hazards to health 07/08/2015   22 years at 1 PPD  . IBS (irritable bowel syndrome)  07/08/2015   Diagnosis by Dr. Conley Canal her prior PCP  . PONV (postoperative nausea and vomiting)   . Sigmoid diverticulitis 07/08/2015   Past Surgical History:  Procedure Laterality Date  . APPENDECTOMY  1962  . BILATERAL TOTAL MASTECTOMY WITH AXILLARY LYMPH NODE DISSECTION  2005   bilateral mastectomy  . BREAST RECONSTRUCTION  2005  . TUBAL LIGATION  1984   Social History   Socioeconomic History  . Marital status: Married    Spouse name: Not on file  . Number of children: 2  . Years of education: Not on file  . Highest education level: Not on file  Occupational History  . Occupation: CNA  Tobacco Use  . Smoking status: Former Smoker    Packs/day: 1.00    Years: 30.00    Pack years: 30.00    Types: Cigarettes    Quit date: 07/07/1992    Years since quitting: 27.2  . Smokeless tobacco: Never Used  Substance and Sexual Activity  . Alcohol use: Dawson  . Drug use: Dawson  . Sexual activity: Not on file  Other Topics Concern  . Not on file  Social History Narrative   Lives in [place], previously lived in [place] until 2020. Married 2007. 2 kids (son and daughter). 2 grandkids (granddaughter and grandson). Both in [place]. Grandson on on the way (son's) in April 2016.       Works at [employer]- a branch of [employer].    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       Hobbies: computer games, tv   Social Determinants of Health   Financial Resource Strain:   . Difficulty of Paying Living Expenses:   Food Insecurity:   . Worried About Charity fundraiser in the Last Year:   . Arboriculturist in the Last Year:   Transportation Needs:   . Film/video editor (Medical):   Marland Kitchen Lack of Transportation (Non-Medical):   Physical Activity:   . Days of Exercise per Week:   . Minutes of Exercise per Session:   Stress:   . Feeling of Stress :   Social Connections:   . Frequency of Communication with Friends and Family:   . Frequency of Social  Gatherings with Friends and Family:   . Attends Religious Services:   . Active Member of Clubs or Organizations:   . Attends Archivist Meetings:   Marland Kitchen Marital Status:    Dawson Known Allergies Family History  Problem Relation Age of Onset  . Hyperlipidemia Mother   . Hypertension Mother   . COPD Mother   . Stroke Mother        in 58s.   Marland Kitchen Heart attack Father        92.   . Tuberculosis Father        1/2 of lung removed.   . Hyperlipidemia Father   . Lymphoma Maternal Aunt   . Skin cancer Paternal Grandmother      Current Outpatient Medications (Cardiovascular):  .  atorvastatin (LIPITOR) 20 MG tablet, TAKE 1 TABLET BY MOUTH TWICE A WEEK .  nitroGLYCERIN (NITRO-DUR) 0.1 mg/hr patch, 1/4 patch daily  Current Outpatient Medications (Respiratory):  .  cetirizine (ZYRTEC) 10 MG tablet, Take 10 mg by mouth daily as needed for allergies.  .  montelukast (SINGULAIR) 10 MG tablet, Take 10 mg by mouth at bedtime. .  triamcinolone (NASACORT AQ) 55 MCG/ACT AERO nasal inhaler, Place 2 sprays into the nose daily.  Current Outpatient Medications (Analgesics):  .  aspirin EC 81 MG tablet, Take 81 mg by mouth at bedtime.   Current Outpatient Medications (Hematological):  .  cyanocobalamin 1000 MCG tablet, Take 1,000 mcg by mouth daily.  Current Outpatient Medications (Other):  .  Biotin 10000 MCG TABS, Take 1 tablet by mouth daily. .  Carboxymethylcellul-Glycerin (LUBRICATING EYE DROPS OP), Apply 1 drop to eye as needed (dry eyes).  .  cholecalciferol (VITAMIN D) 1000 units tablet, Take 1,000 Units by mouth 2 (two) times daily at 8 am and 10 pm. .  Coenzyme Q10 (CO Q-10) 100 MG CAPS, Take by mouth daily.  Marland Kitchen  gabapentin (NEURONTIN) 100 MG capsule, TAKE 1 TO 3 CAPSULES BY MOUTH THREE TIMES DAILY .  ketotifen (ZADITOR) 0.025 % ophthalmic solution, Place 1 drop into both eyes daily. .  Multiple Vitamin (MULTIVITAMIN) tablet, Take 1 tablet by mouth daily.   Marland Kitchen  OVER THE COUNTER  MEDICATION, Caltrate .  valACYclovir (VALTREX) 500 MG tablet, Take 500 mg by mouth at bedtime.    Reviewed prior external information including notes and imaging from  primary care provider As well as notes that were available from care everywhere and other healthcare systems.  Past medical history, social, surgical and family history all reviewed in electronic medical record.  Dawson pertanent information unless stated regarding to the chief complaint.   Review of Systems:  Dawson headache, visual changes, nausea, vomiting, diarrhea, constipation, dizziness, abdominal pain, skin  rash, fevers, chills, night sweats, weight loss, swollen lymph nodes, body aches, joint swelling, chest pain, shortness of breath, mood changes. POSITIVE muscle aches  Objective  Blood pressure 110/70, pulse 77, height 5\' 1"  (1.549 m), weight 161 lb (73 kg), SpO2 98 %.   General: Dawson apparent distress alert and oriented x3 mood and affect normal, dressed appropriately.  HEENT: Pupils equal, extraocular movements intact  Respiratory: Patient's speak in full sentences and does not appear short of breath  Cardiovascular: Dawson lower extremity edema, non tender, Dawson erythema  Skin: Warm dry intact with Dawson signs of infection or rash on extremities or on axial skeleton.  Abdomen: Soft nontender  Neuro: Cranial nerves II through XII are intact, neurovascularly intact in all extremities with 2+ DTRs and 2+ pulses.  Lymph: Dawson lymphadenopathy of posterior or anterior cervical chain or axillae bilaterally.  Gait normal with good balance and coordination.  MSK:  Non tender with full range of motion and good stability and symmetric strength and tone of  elbows, wrist, hip, knee and ankles bilaterally.  Right shoulder does have improvement in range of motion but patient unfortunately does have now positive empty can which is different.  Patient does have a mild positive crossover but does seem to be improved.  Dawson radicular symptoms down the  arm at the moment.  Limited musculoskeletal ultrasound was performed and interpreted by Lyndal Pulley  Limited ultrasound of patient's rotator cuff shows 1 possible intersubstance tear now of the supraspinatus.  This is different than previous exam.  Significant decrease in the hypoechoic changes that were consistent with more of the bursitis previously.  Mild to moderate arthritic changes of the acromioclavicular joint with still swelling noted.   Impression and Recommendations:     This case required medical decision making of moderate complexity. The above documentation has been reviewed and is accurate and complete Lyndal Pulley, DO       Note: This dictation was prepared with Dragon dictation along with smaller phrase technology. Any transcriptional errors that result from this process are unintentional.

## 2019-10-18 NOTE — Patient Instructions (Signed)
See me again in 6 weeks we will discuss PRP or MRI  Nitroglycerin Protocol   Apply 1/4 nitroglycerin patch to affected area daily.  Change position of patch within the affected area every 24 hours.  You may experience a headache during the first 1-2 weeks of using the patch, these should subside.  If you experience headaches after beginning nitroglycerin patch treatment, you may take your preferred over the counter pain reliever.  Another side effect of the nitroglycerin patch is skin irritation or rash related to patch adhesive.  Please notify our office if you develop more severe headaches or rash, and stop the patch.  Tendon healing with nitroglycerin patch may require 12 to 24 weeks depending on the extent of injury.  Men should not use if taking Viagra, Cialis, or Levitra.   Do not use if you have migraines or rosacea.

## 2019-10-18 NOTE — Assessment & Plan Note (Signed)
With patient having the decreasing hypoechoic changes can see the integrity of the supraspinatus more than previously.  Patient does have 2 small intrasubstance tear noted.  Discussed icing regimen and home exercises, patient will continue with conservative therapy otherwise.  Patient will follow up with me again in 6 weeks and will discuss the possible need for further imaging.

## 2019-10-22 ENCOUNTER — Telehealth: Payer: Self-pay | Admitting: Family Medicine

## 2019-10-22 NOTE — Telephone Encounter (Signed)
Chief Complaint Finger Injury Reason for Call Symptomatic / Request for Health Information Initial Comment Caller states she thinks she has an infection in her finger, she had cut it. She has red blisters on it, and it itches. She is at work and she is a Quarry manager. She is asking if she can get a call back ASAP Translation No Nurse Assessment Nurse: Olevia Bowens, RN, Lauren Date/Time Eilene Ghazi Time): 10/22/2019 11:43:11 AM Confirm and document reason for call. If symptomatic, describe symptoms. ---Caller states she thinks she has an infection in her finger, she had cut it. She has red blisters on it, and it itches. She is at work and she is a Quarry manager. Cut her Left middle finger on Friday- not sure what she cut it on- she was in [place]. States she takes ASA every night. Was bleeding initially for a few minutes. Has been keeping bandaid on finger and uses a lot of hand sanitizer and gloves at work. Red blood blisters appeared yesterday and now it is swollen- looks like a band around her finger. States it feels tight and sore. Used some witch hazel this morning. Not sure if she has a fever. No other symptoms. Has the patient had close contact with a person known or suspected to have the novel coronavirus illness OR traveled / lives in area with major community spread (including international travel) in the last 14 days from the onset of symptoms? * If Asymptomatic, screen for exposure and travel within the last 14 days. ---No Does the patient have any new or worsening symptoms? ---Yes Will a triage be completed? ---Yes Related visit to physician within the last 2 weeks? ---No Does the PT have any chronic conditions? (i.e. diabetes, asthma, this includes High risk factors for pregnancy, etc.) ---No Is this a behavioral health or substance abuse call? ---No PLEASE NOTE: All timestamps contained within this report are represented as [place] Standard Time. CONFIDENTIALTY NOTICE: This fax transmission is  intended only for the addressee. It contains information that is legally privileged, confidential or otherwise protected from use or disclosure. If you are not the intended recipient, you are strictly prohibited from reviewing, disclosing, copying using or disseminating any of this information or taking any action in reliance on or regarding this information. If you have received this fax in error, please notify us immediately by telephone so that we can arrange for its return to [place]. Phone: [phone], Toll-Free: [phone], Fax: [phone] Page: 2 of 2 Call Id: [id number] Guidelines Guideline Title Affirmed Question Affirmed Notes Nurse Date/Time Eilene Ghazi Time) Finger Injury Large swelling or bruise Marianna Fuss, Lauren 10/22/2019 11:46:56 AM Disp. Time Eilene Ghazi Time) Disposition Final User 10/22/2019 11:24:59 AM Attempt made - message left Danie Binder 10/22/2019 11:51:02 AM See PCP within 24 Hours Yes Olevia Bowens, RN, Ander Purpura

## 2019-10-23 ENCOUNTER — Ambulatory Visit (INDEPENDENT_AMBULATORY_CARE_PROVIDER_SITE_OTHER): Payer: Medicare HMO | Admitting: Physician Assistant

## 2019-10-23 ENCOUNTER — Encounter: Payer: Self-pay | Admitting: Physician Assistant

## 2019-10-23 ENCOUNTER — Other Ambulatory Visit: Payer: Self-pay

## 2019-10-23 VITALS — BP 122/64 | HR 73 | Temp 97.3°F | Ht 61.0 in | Wt 161.5 lb

## 2019-10-23 DIAGNOSIS — L249 Irritant contact dermatitis, unspecified cause: Secondary | ICD-10-CM | POA: Diagnosis not present

## 2019-10-23 NOTE — Patient Instructions (Signed)
It was great to see you!  Trial topical over-the-counter cortisone ointment for your finger.  Please follow-up if any worsening symptoms or new fever, chills, redness spreading.  Take care,  Inda Coke PA-C

## 2019-10-23 NOTE — Progress Notes (Signed)
[name] is a 69 y.o. female here for a new problem.  I acted as a Education administrator for [employer], PA-C Nicole Pickler, LPN  History of Present Illness:   Chief Complaint  Patient presents with  . Laceration    HPI   Laceration Pt has a cut on her left middle finger, happened on Friday at [facility] and Nicole is unsure what happened. On Sunday, Nicole woke up and finger had red blisters and itches. 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 Nicole denies: numbness, tingling, fever, chills, discharge from lesion.  Past Medical History:  Diagnosis Date  . Allergy   . Breast cancer ([place])   . Depression   . Diverticulitis   . Diverticulosis   . Genital herpes   . Heart murmur   . HLD (hyperlipidemia)   . Hx of tobacco use, presenting hazards to health 07/08/2015   22 years at 1 PPD  . IBS (irritable bowel syndrome) 07/08/2015   Diagnosis by Dr. Conley Canal her prior PCP  . PONV (postoperative nausea and vomiting)   . Sigmoid diverticulitis 07/08/2015     Social History   Socioeconomic History  . Marital status: Married    Spouse name: Not on file  . Number of children: 2  . Years of education: Not on file  . Highest education level: Not on file  Occupational History  . Occupation: CNA  Tobacco Use  . Smoking status: Former Smoker    Packs/day: 1.00    Years: 30.00    Pack years: 30.00    Types: Cigarettes    Quit date: 07/07/1992    Years since quitting: 27.3  . Smokeless tobacco: Never Used  Substance and Sexual Activity  . Alcohol use: No  . Drug use: No  . Sexual activity: Not on file  Other Topics Concern  . Not on file  Social History Narrative   Lives in [place], previously lived in [place] until 2020. Married 2007. 2 kids (son and daughter). 2 grandkids (granddaughter and grandson). Both in [place]. Grandson on on the way (son's) in April 2016.       Works at [employer]- a branch of [employer].    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       Hobbies: computer games, tv   Social Determinants of Health   Financial Resource Strain:   . Difficulty of Paying Living Expenses:   Food Insecurity:   . Worried About Charity fundraiser in the Last Year:   . Arboriculturist in the Last Year:   Transportation Needs:   . Film/video editor (Medical):   Marland Kitchen Lack of Transportation (Non-Medical):   Physical Activity:   . Days of Exercise per Week:   . Minutes of Exercise per Session:   Stress:   . Feeling of Stress :   Social Connections:   . Frequency of Communication with Friends and Family:   . Frequency of Social Gatherings with Friends and Family:   . Attends Religious Services:   . Active Member of Clubs or Organizations:   . Attends Archivist Meetings:   Marland Kitchen Marital Status:   Intimate Partner Violence:   . Fear of Current or Ex-Partner:   . Emotionally Abused:   Marland Kitchen Physically Abused:   . Sexually Abused:     Past Surgical History:  Procedure  Laterality Date  . APPENDECTOMY  1962  . BILATERAL TOTAL MASTECTOMY WITH AXILLARY LYMPH NODE DISSECTION  2005   bilateral mastectomy  . BREAST RECONSTRUCTION  2005  . TUBAL LIGATION  1984    Family History  Problem Relation Age of Onset  . Hyperlipidemia Mother   . Hypertension Mother   . COPD Mother   . Stroke Mother        in 34s.   Marland Kitchen Heart attack Father        55.   . Tuberculosis Father        1/2 of lung removed.   . Hyperlipidemia Father   . Lymphoma Maternal Aunt   . Skin cancer Paternal Grandmother     No Known Allergies  Current Medications:   Current Outpatient Medications:  .  aspirin EC 81 MG tablet, Take 81 mg by mouth at bedtime. , Disp: , Rfl:  .  atorvastatin (LIPITOR) 20 MG tablet, TAKE 1 TABLET BY MOUTH TWICE A WEEK, Disp: 26 tablet, Rfl: 1 .  Biotin 10000 MCG TABS, Take 1 tablet by mouth daily., Disp: , Rfl:  .   Carboxymethylcellul-Glycerin (LUBRICATING EYE DROPS OP), Apply 1 drop to eye as needed (dry eyes). , Disp: , Rfl:  .  cetirizine (ZYRTEC) 10 MG tablet, Take 10 mg by mouth daily as needed for allergies. , Disp: , Rfl:  .  cholecalciferol (VITAMIN D) 1000 units tablet, Take 1,000 Units by mouth 2 (two) times daily at 8 am and 10 pm., Disp: , Rfl:  .  Coenzyme Q10 (CO Q-10) 100 MG CAPS, Take by mouth daily. , Disp: , Rfl:  .  cyanocobalamin 1000 MCG tablet, Take 1,000 mcg by mouth daily., Disp: , Rfl:  .  gabapentin (NEURONTIN) 100 MG capsule, TAKE 1 TO 3 CAPSULES BY MOUTH THREE TIMES DAILY, Disp: 270 capsule, Rfl: 0 .  ketotifen (ZADITOR) 0.025 % ophthalmic solution, Place 1 drop into both eyes daily., Disp: , Rfl:  .  montelukast (SINGULAIR) 10 MG tablet, Take 10 mg by mouth at bedtime., Disp: , Rfl:  .  Multiple Vitamin (MULTIVITAMIN) tablet, Take 1 tablet by mouth daily.  , Disp: , Rfl:  .  OVER THE COUNTER MEDICATION, Caltrate, Disp: , Rfl:  .  triamcinolone (NASACORT AQ) 55 MCG/ACT AERO nasal inhaler, Place 2 sprays into the nose daily., Disp: 1 Inhaler, Rfl: 12 .  valACYclovir (VALTREX) 500 MG tablet, Take 500 mg by mouth at bedtime. , Disp: , Rfl:  .  nitroGLYCERIN (NITRO-DUR) 0.1 mg/hr patch, 1/4 patch daily (Patient not taking: Reported on 10/23/2019), Disp: 30 patch, Rfl: 12   Review of Systems:   ROS  Negative unless otherwise specified per HPI.  Vitals:   Vitals:   10/23/19 1039  BP: 122/64  Pulse: 73  Temp: (!) 97.3 F (36.3 C)  TempSrc: Temporal  SpO2: 97%  Weight: 161 lb 8 oz (73.3 kg)  Height: 5\' 1"  (1.549 m)     Body mass index is 30.52 kg/m.  Physical Exam:   Physical Exam Constitutional:      Appearance: Nicole is well-developed.  HENT:     Head: Normocephalic and atraumatic.  Eyes:     Conjunctiva/sclera: Conjunctivae normal.  Cardiovascular:     Comments: Normal cap refill in L middle finger. Pulmonary:     Effort: Pulmonary effort is normal.   Musculoskeletal:        General: Normal range of motion.     Cervical back: Normal range of motion  and neck supple.  Skin:    General: Skin is warm and dry.     Comments: Proximal phalange of 3rd finger on L hand with swelling and erythema. No TTP. Does have slight blister appearance on medial aspect. No fluctuance or induration.   Neurological:     Mental Status: Nicole is alert and oriented to person, place, and time.  Psychiatric:        Behavior: Behavior normal.        Thought Content: Thought content normal.        Judgment: Judgment normal.      Assessment and Plan:   Nicole Dawson was seen today for laceration.  Diagnoses and all orders for this visit:  Irritant dermatitis   Patient also evaluated by Dr. Jerline Pain, suspect irritant dermatitis from bandage and hand sanitizer. Recommend trial OTC cortisone cream for this. Follow-up precautions given, including: fever, chills, worsening pain/redness, or other concerns.  . Reviewed expectations re: course of current medical issues. . Discussed self-management of symptoms. . Outlined signs and symptoms indicating need for more acute intervention. . Patient verbalized understanding and all questions were answered. . See orders for this visit as documented in the electronic medical record. . Patient received an After-Visit Summary.  CMA or LPN served as scribe during this visit. History, Physical, and Plan performed by medical provider. The above documentation has been reviewed and is accurate and complete.   Inda Coke, PA-C

## 2019-12-14 ENCOUNTER — Ambulatory Visit: Payer: TRICARE For Life (TFL) | Admitting: Family Medicine

## 2019-12-24 ENCOUNTER — Telehealth: Payer: Self-pay | Admitting: Family Medicine

## 2019-12-24 NOTE — Telephone Encounter (Signed)
Do you mind calling patient and getting dates and Designer, fashion/clothing

## 2019-12-24 NOTE — Telephone Encounter (Signed)
Patient calling , she has had covid shots at her work.

## 2019-12-27 NOTE — Telephone Encounter (Signed)
Left voice Mails form Patient .Nicole Dawson

## 2019-12-28 NOTE — Telephone Encounter (Signed)
Vaccines added to record

## 2019-12-28 NOTE — Telephone Encounter (Signed)
Pt received Pfeizer Vaccine.   1st: 07/24/2019 2nd: 08/15/2019

## 2020-01-04 DIAGNOSIS — H43813 Vitreous degeneration, bilateral: Secondary | ICD-10-CM | POA: Diagnosis not present

## 2020-01-04 DIAGNOSIS — H2513 Age-related nuclear cataract, bilateral: Secondary | ICD-10-CM | POA: Diagnosis not present

## 2020-01-05 ENCOUNTER — Other Ambulatory Visit: Payer: Self-pay | Admitting: Family Medicine

## 2020-01-31 DIAGNOSIS — H10411 Chronic giant papillary conjunctivitis, right eye: Secondary | ICD-10-CM | POA: Diagnosis not present

## 2020-02-26 DIAGNOSIS — J3089 Other allergic rhinitis: Secondary | ICD-10-CM | POA: Diagnosis not present

## 2020-03-30 ENCOUNTER — Other Ambulatory Visit: Payer: Self-pay | Admitting: Family Medicine

## 2020-04-10 ENCOUNTER — Other Ambulatory Visit: Payer: Self-pay

## 2020-04-10 MED ORDER — ATORVASTATIN CALCIUM 20 MG PO TABS: ORAL_TABLET | ORAL | 0 refills | Status: DC

## 2020-05-26 DIAGNOSIS — Z9889 Other specified postprocedural states: Secondary | ICD-10-CM | POA: Diagnosis not present

## 2020-06-13 ENCOUNTER — Other Ambulatory Visit: Payer: Self-pay | Admitting: Family Medicine

## 2020-06-24 DIAGNOSIS — L570 Actinic keratosis: Secondary | ICD-10-CM | POA: Diagnosis not present

## 2020-06-24 DIAGNOSIS — L820 Inflamed seborrheic keratosis: Secondary | ICD-10-CM | POA: Diagnosis not present

## 2020-06-24 DIAGNOSIS — L821 Other seborrheic keratosis: Secondary | ICD-10-CM | POA: Diagnosis not present

## 2020-07-08 DIAGNOSIS — L708 Other acne: Secondary | ICD-10-CM | POA: Diagnosis not present

## 2020-07-08 DIAGNOSIS — D1801 Hemangioma of skin and subcutaneous tissue: Secondary | ICD-10-CM | POA: Diagnosis not present

## 2020-07-08 DIAGNOSIS — L738 Other specified follicular disorders: Secondary | ICD-10-CM | POA: Diagnosis not present

## 2020-07-14 DIAGNOSIS — N8111 Cystocele, midline: Secondary | ICD-10-CM | POA: Diagnosis not present

## 2020-07-30 ENCOUNTER — Other Ambulatory Visit: Payer: Self-pay

## 2020-07-30 MED ORDER — GABAPENTIN 100 MG PO CAPS: 100.0000 mg | ORAL_CAPSULE | Freq: Three times a day (TID) | ORAL | 3 refills | Status: DC

## 2020-08-26 ENCOUNTER — Other Ambulatory Visit: Payer: Self-pay

## 2020-08-26 MED ORDER — ATORVASTATIN CALCIUM 20 MG PO TABS: ORAL_TABLET | ORAL | 1 refills | Status: DC

## 2020-09-24 ENCOUNTER — Telehealth: Payer: Self-pay

## 2020-09-24 NOTE — Telephone Encounter (Signed)
error 

## 2020-12-09 ENCOUNTER — Ambulatory Visit (INDEPENDENT_AMBULATORY_CARE_PROVIDER_SITE_OTHER): Payer: Medicare HMO | Admitting: Family

## 2020-12-09 ENCOUNTER — Ambulatory Visit (INDEPENDENT_AMBULATORY_CARE_PROVIDER_SITE_OTHER): Payer: Medicare HMO

## 2020-12-09 ENCOUNTER — Encounter: Payer: Self-pay | Admitting: Family

## 2020-12-09 VITALS — Ht 61.0 in

## 2020-12-09 DIAGNOSIS — W19XXXA Unspecified fall, initial encounter: Secondary | ICD-10-CM | POA: Diagnosis not present

## 2020-12-09 DIAGNOSIS — M25561 Pain in right knee: Secondary | ICD-10-CM

## 2020-12-09 DIAGNOSIS — M25562 Pain in left knee: Secondary | ICD-10-CM

## 2020-12-09 NOTE — Patient Instructions (Signed)
Bursitis  Bursitis is when the fluid-filled sac (bursa) that covers and protects a joint is swollen (inflamed). Bursitis is most common near joints such as the knees, elbows, hips, and shoulders. It can cause pain and stiffness. Follow these instructions at home: Medicines  Take over-the-counter and prescription medicines only as told by your doctor.  If you were prescribed an antibiotic medicine, take it as told by your doctor. Do not stop taking it even if you start to feel better. General instructions  Rest the affected area as told by your doctor. ? If you can, raise (elevate) the affected area above the level of your heart while you are sitting or lying down. ? Avoid doing things that make the pain worse.  Use a splint, brace, pad, or walking aid as told by your doctor.  If directed, put ice on the affected area: ? If you have a removable splint or brace, take it off as told by your doctor. ? Put ice in a plastic bag. ? Place a towel between your skin and the bag, or between the splint or brace and the bag. ? Leave the ice on for 20 minutes, 2-3 times a day.  Keep all follow-up visits as told by your doctor. This is important.   Preventing symptoms Do these things to help you not have symptoms again:  Wear knee pads if you kneel often.  Wear sturdy running or walking shoes that fit you well.  Take a lot of breaks during activities that involve doing the same movements again and again.  Before you do any activity that takes a lot of effort, get your body ready by stretching.  Stay at a healthy weight or lose weight if your doctor says you should. If you need help doing this, ask your doctor.  Exercise often. If you start any new physical activity, do it slowly. Contact a doctor if you:  Have a fever.  Have chills.  Have symptoms that do not get better with treatment or home care. Summary  Bursitis is when the fluid-filled sac (bursa) that covers and protects a joint  is swollen.  Rest the affected area as told by your doctor.  Avoid doing things that make the pain worse.  Put ice on the affected area as told by your doctor. This information is not intended to replace advice given to you by your health care provider. Make sure you discuss any questions you have with your health care provider. Document Revised: 01/23/2020 Document Reviewed: 01/23/2020 Elsevier Patient Education  [place].

## 2020-12-10 NOTE — Progress Notes (Signed)
Acute Office Visit  Subjective:    Patient ID: Nicole Dawson, female    DOB: 02/16/1951, 70 y.o.   MRN: [id number]  Chief Complaint  Patient presents with  . Knee Pain    She says that she feel a 1 year or so at work on both knees. She hears cracking when she moves her leg.   . Fall    At work- 12/08/2020    HPI Patient is in today with c/o left knee pain after a fall at work on 5/9 where she tripped, fell to the floor injuring her knee. Pain 6/10, worse with applying pressure. Patient reports having a fall about 1 year ago as well, injuring her right knee. She has been apply ice to the area. That has helped. She is concerned about damage to her knees.   Past Medical History:  Diagnosis Date  . Allergy   . Breast cancer ([place])   . Depression   . Diverticulitis   . Diverticulosis   . Genital herpes   . Heart murmur   . HLD (hyperlipidemia)   . Hx of tobacco use, presenting hazards to health 07/08/2015   22 years at 1 PPD  . IBS (irritable bowel syndrome) 07/08/2015   Diagnosis by Dr. Conley Canal her prior PCP  . PONV (postoperative nausea and vomiting)   . Sigmoid diverticulitis 07/08/2015    Past Surgical History:  Procedure Laterality Date  . APPENDECTOMY  1962  . BILATERAL TOTAL MASTECTOMY WITH AXILLARY LYMPH NODE DISSECTION  2005   bilateral mastectomy  . BREAST RECONSTRUCTION  2005  . TUBAL LIGATION  1984    Family History  Problem Relation Age of Onset  . Hyperlipidemia Mother   . Hypertension Mother   . COPD Mother   . Stroke Mother        in 86s.   Marland Kitchen Heart attack Father        65.   . Tuberculosis Father        1/2 of lung removed.   . Hyperlipidemia Father   . Lymphoma Maternal Aunt   . Skin cancer Paternal Grandmother     Social History   Socioeconomic History  . Marital status: Married    Spouse name: Not on file  . Number of children: 2  . Years of education: Not on file  . Highest education level: Not on file  Occupational History  .  Occupation: CNA  Tobacco Use  . Smoking status: Former Smoker    Packs/day: 1.00    Years: 30.00    Pack years: 30.00    Types: Cigarettes    Quit date: 07/07/1992    Years since quitting: 28.4  . Smokeless tobacco: Never Used  Substance and Sexual Activity  . Alcohol use: No  . Drug use: No  . Sexual activity: Not on file  Other Topics Concern  . Not on file  Social History Narrative   Lives in [place], previously lived in [place] until 2020. Married 2007. 2 kids (son and daughter). 2 grandkids (granddaughter and grandson). Both in [place]. Grandson on on the way (son's) in April 2016.       Works at [employer]- a branch of [employer].    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       Hobbies: computer games, tv   Social Determinants of Health   Financial Resource Strain: Not on file  Food Insecurity: Not on file  Transportation Needs: Not on  file  Physical Activity: Not on file  Stress: Not on file  Social Connections: Not on file  Intimate Partner Violence: Not on file    Outpatient Medications Prior to Visit  Medication Sig Dispense Refill  . aspirin EC 81 MG tablet Take 81 mg by mouth at bedtime.     Marland Kitchen atorvastatin (LIPITOR) 20 MG tablet Take one tablet by mouth once a week. 26 tablet 1  . Biotin 10000 MCG TABS Take 1 tablet by mouth daily.    . Carboxymethylcellul-Glycerin (LUBRICATING EYE DROPS OP) Apply 1 drop to eye as needed (dry eyes).     . cetirizine (ZYRTEC) 10 MG tablet Take 10 mg by mouth daily as needed for allergies.    . cholecalciferol (VITAMIN D) 1000 units tablet Take 1,000 Units by mouth 2 (two) times daily at 8 am and 10 pm.    . Coenzyme Q10 (CO Q-10) 100 MG CAPS Take by mouth daily.     . cyanocobalamin 1000 MCG tablet Take 1,000 mcg by mouth daily.    Marland Kitchen gabapentin (NEURONTIN) 100 MG capsule Take 1 capsule (100 mg total) by mouth 3 (three) times daily. 270 capsule 3  . ketotifen (ZADITOR) 0.025 % ophthalmic  solution Place 1 drop into both eyes daily.    . montelukast (SINGULAIR) 10 MG tablet Take 10 mg by mouth at bedtime.    . Multiple Vitamin (MULTIVITAMIN) tablet Take 1 tablet by mouth daily.    . nitroGLYCERIN (NITRO-DUR) 0.1 mg/hr patch 1/4 patch daily 30 patch 12  . OVER THE COUNTER MEDICATION Caltrate    . triamcinolone (NASACORT AQ) 55 MCG/ACT AERO nasal inhaler Place 2 sprays into the nose daily. 1 Inhaler 12  . valACYclovir (VALTREX) 500 MG tablet Take 500 mg by mouth at bedtime.      No facility-administered medications prior to visit.    No Known Allergies  Review of Systems     Objective:    Physical Exam  Ht 5\' 1"  (1.549 m)   BMI 30.52 kg/m  Wt Readings from Last 3 Encounters:  10/23/19 161 lb 8 oz (73.3 kg)  10/18/19 161 lb (73 kg)  09/05/19 158 lb (71.7 kg)    Health Maintenance Due  Topic Date Due  . TETANUS/TDAP  05/29/2019    There are no preventive care reminders to display for this patient.       Assessment & Plan:   Problem List Items Addressed This Visit   None   Visit Diagnoses    Fall on hard surface    -  Primary   Relevant Orders   DG Knee Complete 4 Views Left   DG Knee Complete 4 Views Right   Acute pain of both knees       Relevant Orders   DG Knee Complete 4 Views Left   DG Knee Complete 4 Views Right      Tessy was seen today for knee pain and fall.  Diagnoses and all orders for this visit:  Fall on hard surface -     DG Knee Complete 4 Views Left; Future -     DG Knee Complete 4 Views Right; Future  Acute pain of both knees -     DG Knee Complete 4 Views Left; Future -     DG Knee Complete 4 Views Right; Future  Advil as needed with food. Will follow-up pending x-rays. Call the office with any questions or concerns.   Kennyth Arnold, FNP

## 2021-01-01 ENCOUNTER — Telehealth: Payer: Self-pay

## 2021-01-01 ENCOUNTER — Emergency Department (HOSPITAL_COMMUNITY)
Admission: EM | Admit: 2021-01-01 | Discharge: 2021-01-01 | Disposition: A | Discharge status: Home / Self Care | Payer: Medicare HMO | Attending: Emergency Medicine | Admitting: Emergency Medicine

## 2021-01-01 ENCOUNTER — Encounter (HOSPITAL_COMMUNITY): Payer: Self-pay | Admitting: Emergency Medicine

## 2021-01-01 DIAGNOSIS — Z853 Personal history of malignant neoplasm of breast: Secondary | ICD-10-CM | POA: Diagnosis not present

## 2021-01-01 DIAGNOSIS — Z87891 Personal history of nicotine dependence: Secondary | ICD-10-CM | POA: Insufficient documentation

## 2021-01-01 DIAGNOSIS — Z7982 Long term (current) use of aspirin: Secondary | ICD-10-CM | POA: Insufficient documentation

## 2021-01-01 DIAGNOSIS — M25512 Pain in left shoulder: Secondary | ICD-10-CM | POA: Diagnosis not present

## 2021-01-01 NOTE — Progress Notes (Signed)
I, Peterson Lombard, LAT, ATC acting as a scribe for Lynne Leader, MD.  Nicole Dawson is a 70 y.o. female who presents to [place] at [facility] today for L shoulder pain ongoing since Tuesday, 12/30/20 w/ no known MOI. Pt works at [employer] and states that there are very heavy doors and suspects maybe pulling a door too hard. Pt was previously seen by Dr. Tamala Julian on 10/18/19 for R shoulder pain and was advised to ice and work on HEP. Pt was seen at the [facility] ED on 01/01/21 c/o slow onset worsening L shoulder pain. Pt is unable to pinpoint location of pain.  Neck pain: no- tightness in L trap Radiates: yes- pain shoots down arm Mechanical symptoms: no UE Numbness/tingling: yes- into hand UE Weakness: no Aggravates: certain movt Treatments tried: IBU, Tylenol, ice, heat  Pertinent review of systems: No fevers or chills  Relevant historical information: History of right shoulder pain thought to be due to bursitis   Exam:  BP 124/78 (BP Location: Left Arm, Patient Position: Sitting, Cuff Size: Normal)   Pulse 75   Ht 5\' 1"  (1.549 m)   Wt 168 lb 6.4 oz (76.4 kg)   SpO2 96%   BMI 31.82 kg/m  General: Well Developed, well nourished, and in no acute distress.   MSK: Left shoulder normal. Nontender. Normal range of motion pain with abduction and internal rotation. Intact strength pain with abduction. Positive Hawkins and Neer's test.  Positive empty can test. Negative Yergason's and speeds test. Pulses capillary fill and sensation are intact distally.    Lab and Radiology Results  Procedure: Real-time Ultrasound Guided Injection of left shoulder subacromial bursa Device: Philips Affiniti 50G Images permanently stored and available for review in PACS Ultrasound evaluation reveals intact rotator cuff tendons.  Moderate subacromial bursitis is present. Verbal informed consent obtained.  Discussed risks and benefits of procedure. Warned about  infection bleeding damage to structures skin hypopigmentation and fat atrophy among others. Patient expresses understanding and agreement Time-out conducted.   Noted no overlying erythema, induration, or other signs of local infection.   Skin prepped in a sterile fashion.   Local anesthesia: Topical Ethyl chloride.   With sterile technique and under real time ultrasound guidance:  40 mg of Kenalog and 2 mL of Marcaine injected into the subacromial bursa. Fluid seen entering the bursa.   Completed without difficulty   Pain immediately resolved suggesting accurate placement of the medication.   Advised to call if fevers/chills, erythema, induration, drainage, or persistent bleeding.   Images permanently stored and available for review in the ultrasound unit.  Impression: Technically successful ultrasound guided injection.     Assessment and Plan: 70 y.o. female with left shoulder pain ongoing for few days.  Pain occurred without injury.  Pain thought to be related to subacromial bursitis.  Plan to treat with subacromial injection today.  Also reviewed home exercise program to clinic today by ATC and refer to physical therapy.  Recheck in 6 weeks especially if not improved.   PDMP not reviewed this encounter. Orders Placed This Encounter  Procedures  . [place] LIMITED JOINT SPACE STRUCTURES UP LEFT(NO LINKED CHARGES)    Standing Status:   Future    Number of Occurrences:   1    Standing Expiration Date:   07/04/2021    Order Specific Question:   Reason for Exam (SYMPTOM  OR DIAGNOSIS REQUIRED)    Answer:   left shoulder pain  Order Specific Question:   Preferred imaging location?    Answer:   [place]  . Ambulatory referral to Physical Therapy    Referral Priority:   Routine    Referral Type:   Physical Medicine    Referral Reason:   Specialty Services Required    Requested Specialty:   Physical Therapy   No orders of the defined types were placed in this  encounter.    Discussed warning signs or symptoms. Please see discharge instructions. Patient expresses understanding.   The above documentation has been reviewed and is accurate and complete Lynne Leader, M.D.

## 2021-01-01 NOTE — Telephone Encounter (Signed)
Nurse Assessment Nurse: Vallery Sa, RN, Cathy Date/Time (Eastern Time): 01/01/2021 9:20:22 AM Confirm and document reason for call. If symptomatic, describe symptoms. ---Nicole Dawson states she developed pain in her left shoulder two days ago (current pain rated as a 10 on the 1 to 10 scale). No injury. No breathing difficulty. No chest pain. Alert and responsive. Does the patient have any new or worsening symptoms? ---Yes Will a triage be completed? ---Yes Related visit to physician within the last 2 weeks? ---No Does the PT have any chronic conditions? (i.e. diabetes, asthma, this includes High risk factors for pregnancy, etc.) ---No Is this a behavioral health or substance abuse call? ---No Guidelines Guideline Title Affirmed Question Affirmed Notes Nurse Date/Time Eilene Ghazi Time) Shoulder Pain [1] Age > 40 AND [2] no obvious cause AND [3] pain even when not moving the arm (Exception: pain is clearly made worse Vallery Sa, RN, [place] 01/01/2021 9:22:07 AM PLEASE NOTE: All timestamps contained within this report are represented as [place] Standard Time. CONFIDENTIALTY NOTICE: This fax transmission is intended only for the addressee. It contains information that is legally privileged, confidential or otherwise protected from use or disclosure. If you are not the intended recipient, you are strictly prohibited from reviewing, disclosing, copying using or disseminating any of this information or taking any action in reliance on or regarding this information. If you have received this fax in error, please notify us immediately by telephone so that we can arrange for its return to [place]. Phone: [phone], Toll-Free: [phone], Fax: [phone] Page: 2 of 2 Call Id: [id number] Guidelines Guideline Title Affirmed Question Affirmed Notes Nurse Date/Time Eilene Ghazi Time) by moving arm or bending neck) Disp. Time Eilene Ghazi Time) Disposition Final User 01/01/2021 9:24:19 AM Go to ED Now Yes Vallery Sa,  RN, Rosey Bath Disagree/Comply Comply Caller Understands Yes PreDisposition Call Doctor Care Advice Given Per Guideline GO TO ED NOW: * You need to be seen in the Emergency Department. * Go to the ED at ___________ [place] now. Drive carefully. NOTE TO TRIAGER - DRIVING: * Another adult should drive. * Patient should not delay going to the emergency department. * If immediate transportation is not available via car, rideshare (e.g., Lyft, Uber), or taxi, then the patient should be instructed to call [phone]. CALL EMS IF: * The patient passes out, starts acting confused or becomes too weak to stand. BRING MEDICINES: * Bring a list of your current medicines when you go to the Emergency Department (ER). * Bring the pill bottles too. This will help the doctor (or NP/PA) to make certain you are taking the right medicines and the right dose. CARE ADVICE given per Shoulder Pain (Adult) guideline Referrals [facility] - [facility]

## 2021-01-01 NOTE — ED Provider Notes (Signed)
[facility] DEPT Provider Note   CSN: [id number] Arrival date & time: 01/01/21  1151     History Chief Complaint  Patient presents with  . neck, shoulder and arm pain    Nicole Dawson is a 70 y.o. female.  Patient noticed slow onset worsening left shoulder pain.  Pain with certain motions.  Pain shoots down her arm.  No trauma.  History of rotator cuff issues on the other side.  Thinks it feels similar to that.  No exertional symptoms no chest pain or shortness of breath.  Ibuprofen works very well to relieve the pain        Past Medical History:  Diagnosis Date  . Allergy   . Breast cancer ([place])   . Depression   . Diverticulitis   . Diverticulosis   . Genital herpes   . Heart murmur   . HLD (hyperlipidemia)   . Hx of tobacco use, presenting hazards to health 07/08/2015   22 years at 1 PPD  . IBS (irritable bowel syndrome) 07/08/2015   Diagnosis by Dr. Conley Canal her prior PCP  . PONV (postoperative nausea and vomiting)   . Sigmoid diverticulitis 07/08/2015    Patient Active Problem List   Diagnosis Date Noted  . Right rotator cuff tear 10/18/2019  . Arthritis of right acromioclavicular joint 09/05/2019  . Hyperglycemia 04/13/2019  . Subacromial bursitis of right shoulder joint 04/10/2019  . Osteopenia 01/19/2018  . Obesity (BMI 30.0-34.9) 12/08/2017  . Greater trochanteric bursitis of left hip 05/10/2016  . IBS (irritable bowel syndrome) 07/08/2015  . Sigmoid diverticulitis 07/08/2015  . Hx of tobacco use, presenting hazards to health 07/08/2015  . Restless leg syndrome 07/05/2014  . Wheezing 05/30/2013  . Low back pain 08/03/2012  . Hyperlipidemia 05/28/2009  . TRICUSPID REGURGITATION 05/28/2009  . DEPRESSION 03/11/2009  . Allergic rhinitis 03/11/2009  . BREAST CANCER, HX OF 03/11/2009  . HEART MURMUR, HX OF 03/11/2009  . History of herpes genitalis 03/11/2009    Past Surgical History:  Procedure Laterality Date  .  APPENDECTOMY  1962  . BILATERAL TOTAL MASTECTOMY WITH AXILLARY LYMPH NODE DISSECTION  2005   bilateral mastectomy  . BREAST RECONSTRUCTION  2005  . TUBAL LIGATION  1984     OB History   No obstetric history on file.     Family History  Problem Relation Age of Onset  . Hyperlipidemia Mother   . Hypertension Mother   . COPD Mother   . Stroke Mother        in 52s.   Marland Kitchen Heart attack Father        68.   . Tuberculosis Father        1/2 of lung removed.   . Hyperlipidemia Father   . Lymphoma Maternal Aunt   . Skin cancer Paternal Grandmother     Social History   Tobacco Use  . Smoking status: Former Smoker    Packs/day: 1.00    Years: 30.00    Pack years: 30.00    Types: Cigarettes    Quit date: 07/07/1992    Years since quitting: 28.5  . Smokeless tobacco: Never Used  Substance Use Topics  . Alcohol use: No  . Drug use: No    Home Medications Prior to Admission medications   Medication Sig Start Date End Date Taking? Authorizing Provider  aspirin EC 81 MG tablet Take 81 mg by mouth at bedtime.     [provider]  atorvastatin (LIPITOR) 20 MG  tablet Take one tablet by mouth once a week. 08/26/20   Marin Olp, MD  Biotin 10000 MCG TABS Take 1 tablet by mouth daily.    [provider]  Carboxymethylcellul-Glycerin (LUBRICATING EYE DROPS OP) Apply 1 drop to eye as needed (dry eyes).     [provider]  cetirizine (ZYRTEC) 10 MG tablet Take 10 mg by mouth daily as needed for allergies.    [provider]  cholecalciferol (VITAMIN D) 1000 units tablet Take 1,000 Units by mouth 2 (two) times daily at 8 am and 10 pm.    [provider]  Coenzyme Q10 (CO Q-10) 100 MG CAPS Take by mouth daily.     [provider]  cyanocobalamin 1000 MCG tablet Take 1,000 mcg by mouth daily.    [provider]  gabapentin (NEURONTIN) 100 MG capsule Take 1 capsule (100 mg total) by mouth 3 (three) times daily. 07/30/20    Marin Olp, MD  ketotifen (ZADITOR) 0.025 % ophthalmic solution Place 1 drop into both eyes daily.    [provider]  montelukast (SINGULAIR) 10 MG tablet Take 10 mg by mouth at bedtime.    [provider]  Multiple Vitamin (MULTIVITAMIN) tablet Take 1 tablet by mouth daily.    [provider]  nitroGLYCERIN (NITRO-DUR) 0.1 mg/hr patch 1/4 patch daily 10/18/19   Lyndal Pulley, DO  OVER THE COUNTER MEDICATION Caltrate    [provider]  triamcinolone (NASACORT AQ) 55 MCG/ACT AERO nasal inhaler Place 2 sprays into the nose daily. 05/30/13   Biagio Borg, MD  valACYclovir (VALTREX) 500 MG tablet Take 500 mg by mouth at bedtime.     [provider]    Allergies    Patient has no known allergies.  Review of Systems   Review of Systems  Constitutional: Negative for chills and fever.  HENT: Negative for congestion and rhinorrhea.   Respiratory: Negative for cough and shortness of breath.   Cardiovascular: Negative for chest pain and palpitations.  Gastrointestinal: Negative for diarrhea, nausea and vomiting.  Genitourinary: Negative for difficulty urinating and dysuria.  Musculoskeletal: Positive for arthralgias. Negative for back pain.  Skin: Negative for rash and wound.  Neurological: Negative for light-headedness and headaches.    Physical Exam Updated Vital Signs BP (!) 161/81 (BP Location: Left Arm)   Pulse 78   Temp 98.1 F (36.7 C) (Oral)   Resp 16   SpO2 97%   Physical Exam Vitals and nursing note reviewed. Exam conducted with a chaperone present.  Constitutional:      General: She is not in acute distress.    Appearance: Normal appearance.  HENT:     Head: Normocephalic and atraumatic.     Nose: No rhinorrhea.  Eyes:     General:        Right eye: No discharge.        Left eye: No discharge.     Conjunctiva/sclera: Conjunctivae normal.  Cardiovascular:     Rate and Rhythm: Normal rate and regular rhythm.   Pulmonary:     Effort: Pulmonary effort is normal. No respiratory distress.     Breath sounds: No stridor.  Abdominal:     General: Abdomen is flat. There is no distension.     Palpations: Abdomen is soft.  Musculoskeletal:        General: Tenderness present. No signs of injury.     Comments: Pain with supination and abduction of the left arm.  Pain with retraction of the shoulder.  Tenderness about the posterior shoulder glenohumeral joint.  No motor weakness in the extremity strong pulses in the extremity subjective numbness of the entire palm.  No pain with compression and axial load with head and extension and lateral rotation.  Skin:    General: Skin is warm and dry.     Capillary Refill: Capillary refill takes less than 2 seconds.  Neurological:     General: No focal deficit present.     Mental Status: She is alert. Mental status is at baseline.     Motor: No weakness.  Psychiatric:        Mood and Affect: Mood normal.        Behavior: Behavior normal.     ED Results / Procedures / Treatments   Labs (all labs ordered are listed, but only abnormal results are displayed) Labs Reviewed - No data to display  EKG None  Radiology No results found.  Procedures Procedures   Medications Ordered in ED Medications - No data to display  ED Course  I have reviewed the triage vital signs and the nursing notes.  Pertinent labs & imaging results that were available during my care of the patient were reviewed by me and considered in my medical decision making (see chart for details).    MDM Rules/Calculators/A&P                          Likely rotator cuff pathology.  Has orthopedic tomorrow.  No trauma.  Unlikely to be helpful to get x-rays.  Anti-inflammatories work well for the pain.  She has no signs or symptoms of other pathology currently.  She is well and she is safe for discharge Final Clinical Impression(s) / ED Diagnoses Final diagnoses:  Acute pain of left  shoulder    Rx / DC Orders ED Discharge Orders    None       Breck Coons, MD 01/01/21 1330

## 2021-01-01 NOTE — ED Triage Notes (Signed)
Per pt, states she woke up yesterday with left lower neck, shoulder and arm pain and tingling-states she went to work where pain increased-no CP, SOB

## 2021-01-01 NOTE — Telephone Encounter (Signed)
Appears patient is being evaluated in the emergency room at present

## 2021-01-01 NOTE — Discharge Instructions (Addendum)
You can take 600 mg of ibuprofen every 6 hours, you can take 1000 mg of Tylenol every 6 hours, you can alternate these every 3 or you can take them together.  

## 2021-01-01 NOTE — Telephone Encounter (Signed)
FYI

## 2021-01-02 ENCOUNTER — Other Ambulatory Visit: Payer: Self-pay

## 2021-01-02 ENCOUNTER — Ambulatory Visit: Payer: Self-pay

## 2021-01-02 ENCOUNTER — Ambulatory Visit (INDEPENDENT_AMBULATORY_CARE_PROVIDER_SITE_OTHER): Payer: Medicare HMO | Admitting: Family Medicine

## 2021-01-02 VITALS — BP 124/78 | HR 75 | Ht 61.0 in | Wt 168.4 lb

## 2021-01-02 DIAGNOSIS — M25512 Pain in left shoulder: Secondary | ICD-10-CM

## 2021-01-02 NOTE — Patient Instructions (Addendum)
Thank you for coming in today.  Please complete the exercises that the athletic trainer went over with you: View at [email or web address] using code: QTGBT67  I've referred you to Physical Therapy.  Let us know if you don't hear from them in one week.  Call or go to the ER if you develop a large red swollen joint with extreme pain or oozing puss.   Recheck in 6 weeks especially if not better.

## 2021-01-07 ENCOUNTER — Encounter: Payer: Self-pay | Admitting: Physical Therapy

## 2021-01-07 ENCOUNTER — Ambulatory Visit (INDEPENDENT_AMBULATORY_CARE_PROVIDER_SITE_OTHER): Payer: Medicare HMO | Admitting: Physical Therapy

## 2021-01-07 ENCOUNTER — Other Ambulatory Visit: Payer: Self-pay

## 2021-01-07 DIAGNOSIS — M6281 Muscle weakness (generalized): Secondary | ICD-10-CM | POA: Diagnosis not present

## 2021-01-07 DIAGNOSIS — M25512 Pain in left shoulder: Secondary | ICD-10-CM

## 2021-01-07 NOTE — Patient Instructions (Signed)
Access Code: [email or web address] URL: [email or web address] Date: 01/07/2021 Prepared by: Daleen Bo  Exercises Shoulder External Rotation and Scapular Retraction with Resistance - 1 x daily - 3-4 x weekly - 3 sets - 10 reps Standing Shoulder Row with Anchored Resistance - 1 x daily - 3-4 x weekly - 3 sets - 10 reps Standing Shoulder Posterior Capsule Stretch - 2 x daily - 7 x weekly - 1 sets - 3 reps - 30 hold

## 2021-01-07 NOTE — Therapy (Addendum)
[facility] [address] [place], [place], [address] Phone: [phone]   Fax:  [phone]  Physical Therapy Evaluation  Patient Details  Name: Ajooni Karam MRN: [id number] Date of Birth: 24-May-1951 Referring Provider (PT): Dr. Georgina Snell   Encounter Date: 01/07/2021   PT End of Session - 01/07/21 1252    Visit Number 1    Number of Visits 15    Date for PT Re-Evaluation 04/07/21    Authorization Type [employer]    PT Start Time 0845    PT Stop Time 0930    PT Time Calculation (min) 45 min    Activity Tolerance Patient tolerated treatment well;No increased pain    Behavior During Therapy WFL for tasks assessed/performed           Past Medical History:  Diagnosis Date  . Allergy   . Breast cancer ([place])   . Depression   . Diverticulitis   . Diverticulosis   . Genital herpes   . Heart murmur   . HLD (hyperlipidemia)   . Hx of tobacco use, presenting hazards to health 07/08/2015   22 years at 1 PPD  . IBS (irritable bowel syndrome) 07/08/2015   Diagnosis by Dr. Conley Canal her prior PCP  . PONV (postoperative nausea and vomiting)   . Sigmoid diverticulitis 07/08/2015    Past Surgical History:  Procedure Laterality Date  . APPENDECTOMY  1962  . BILATERAL TOTAL MASTECTOMY WITH AXILLARY LYMPH NODE DISSECTION  2005   bilateral mastectomy  . BREAST RECONSTRUCTION  2005  . TUBAL LIGATION  1984    There were no vitals filed for this visit.    Subjective Assessment - 01/07/21 0847    Subjective Pt states that the L shoulder pain started happening last week when she went to the ED. She was advised by her 34 RN to rule out cardiac issues. Pt states the pain came on and would not stop hurting. She states she couldn't even lay on it due to the pain. She states after the cortisone shot it is much better, no pain at all. Pt states the pain was sharp and she states she couldn't perform self care grooming. Pt states that she  works at the 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 Pt is a CNA. Pt states the pain was the entire shoulder and there would be some NT that would come down into hand. Current 0/10, Worst 10/10. Aggs: lifting the arm, using it; Eases: ice, heat, resting. Pt endorses night pain but due to positioning. Pt denies unexplained weight loss. 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 Pt denies pain correlating with exertion or types of food. Pt reports only mechanical pain. Pt states in 2004 she has had mastectomy and reconstructive sx.    Limitations Lifting    Currently in Pain? No/denies    Pain Score 0-No pain              [facility] PT Assessment - 01/07/21 0001      Assessment   Medical Diagnosis M25.512 (ICD-10-CM) - Acute pain of left shoulder    Referring Provider (PT) Dr. Georgina Snell    Hand Dominance Right    Next MD Visit 6 weeks    Prior Therapy N/A      Precautions   Precautions None      Restrictions   Weight Bearing Restrictions No      Balance Screen   Has the  patient fallen in the past 6 months Yes    How many times? 1    Has the patient had a decrease in activity level because of a fear of falling?  No    Is the patient reluctant to leave their home because of a fear of falling?  No      Home Ecologist residence      Prior Function   Level of Independence Independent      Cognition   Overall Cognitive Status Within Functional Limits for tasks assessed      Observation/Other Assessments   Other Surveys  Quick Dash    Quick DASH  4.5%  (4.5/100)      Posture/Postural Control   Posture/Postural Control Postural limitations    Postural Limitations Rounded Shoulders;Increased thoracic kyphosis      ROM / Strength   AROM / PROM / Strength AROM;PROM;Strength      AROM   Overall AROM Comments R shoulder WFL    AROM Assessment Site Shoulder    Right/Left Shoulder Left    Left Shoulder Extension  60 Degrees    Left Shoulder Flexion 139 Degrees    Left Shoulder ABduction 151 Degrees    Left Shoulder Internal Rotation 65 Degrees    Left Shoulder External Rotation 79 Degrees      PROM   Overall PROM Comments L shoulder WFL, no pain at end range      Strength   Overall Strength Comments R shoulder 4+/5 throughout; L shoulder 4/5 throughout      Palpation   Palpation comment Decreased L shoulder inferior and posterior joint mobility; TTP L infra and UT, biceps/ant deltoid area also TTP      Special Tests    Special Tests Rotator Cuff Impingement;Biceps/Labral Tests    Rotator Cuff Impingment tests Neer impingement test;Hawkins- Kennedy test;Belly Press;Empty Can test;Speed's test;Painful Arc of Motion    Biceps/Labral tests Pronated Load Test (SLAP)      Neer Impingement test    Findings Negative    Comments crepitus      Hawkins-Kennedy test   Findings Negative    Comments crepitus      Belly Press   Findings Negative      Empty Can test   Findings Negative      Speed's test   Findings Negative      Painful Arc of Motion   Findings Negative      Crank Test   Findings Negative      Pronated load test (SLAP)   Findings Negative                      Objective measurements completed on examination: See above findings.       [facility] Adult PT Treatment/Exercise - 01/07/21 0001      Exercises   Exercises Shoulder      Shoulder Exercises: Standing   External Rotation Limitations GTB 15x 2x10 cued for scap retraction    Theraband Level (Shoulder Row) Level 3 (Green)    Row Limitations 2x10 cued for scap retraction      Shoulder Exercises: Stretch   Other Shoulder Stretches post capsule stretching 30s 3x (cross body)      Manual Therapy   Manual therapy comments STM L infra, ischemic pressure and cross friction                  PT Education - 01/07/21 1251  Education Details MOI, diagnosis, prognosis, anatomy, exercise progression,  joint protection, pt transfer mechanics, body mechanics, muscle firing, HEP, POC    Person(s) Educated Patient    Methods Explanation;Demonstration;Tactile cues;Verbal cues;Handout    Comprehension Verbalized understanding;Verbal cues required;Tactile cues required;Returned demonstration            PT Short Term Goals - 01/07/21 1342      PT SHORT TERM GOAL #1   Title Pt will become independent with HEP in order to demonstrate synthesis of PT education.    Time 2    Period Weeks    Status New      PT SHORT TERM GOAL #2   Title Pt will be able to demonstrate full ability to reach full symmetrical OH ROM in order to demonstrate functional improvement in UE function for self-care and house hold duties.    Time 3    Period Weeks             PT Long Term Goals - 01/07/21 1343      PT LONG TERM GOAL #1   Title Pt will become independent with final HEP in order to demonstrate synthesis of PT education.    Time 8    Period Weeks    Status New      PT LONG TERM GOAL #2   Title Pt will be able to reach [facility] and grab/reach/hold >5 lbs in order to demonstrate functional improvement in L UE function.    Time 8    Period Weeks      PT LONG TERM GOAL #3   Title Pt will be able to single arm row >20lbs with no pain in order to demonstrate functional improvement in L UE function for occupational demands as CNA.    Time 8    Period Weeks    Status New                  Plan - 01/07/21 1308    Clinical Impression Statement Pt is a 70 y.o. female presenting to PT eval for CC of L shoulder pain. Pt presents following cortisone injection with good relief of pain but continued deficits. Pt demonstrates L shoulder weakness, localized muscle hypertonicity/spasm, and decreased ROM. Pt's s/s are consistent with L RTC related pain from overuse injury while at work. Pt's impairments limit her full participation with ADL, occupation, and recreational activities. Pt would benefit from  continued skilled therapy in order to reach goals and maximize functional L shoulder strength and ROM for full return to PLOF.    Personal Factors and Comorbidities Age;Comorbidity 1;Comorbidity 2;Profession    Examination-Activity Limitations Bathing;Dressing;Carry;Reach Overhead    Examination-Participation Restrictions Occupation;Community Activity;Cleaning;Yard Work;Driving    Stability/Clinical Decision Making Stable/Uncomplicated    Clinical Decision Making Low    Rehab Potential Excellent    PT Frequency 2x / week   1-2x   PT Duration 12 weeks   Likely to D/C by 8   PT Treatment/Interventions ADLs/Self Care Home Management;Electrical Stimulation;Iontophoresis 4mg /ml Dexamethasone;Moist Heat;Traction;Ultrasound;Functional mobility training;Therapeutic activities;Therapeutic exercise;Neuromuscular re-education;Manual techniques;Passive range of motion;Dry needling;Taping;Joint Manipulations;Spinal Manipulations;Vasopneumatic Device    PT Next Visit Plan shoulder ER strengthening, STM and joint mob, S/L ER, wall push up, shoulder ABD rainbow, pec stretch    PT Home Exercise Plan Access Code: [email or web address]    Consulted and Agree with Plan of Care Patient           Patient will benefit from skilled therapeutic intervention in order to improve the following deficits  and impairments:  Pain,Improper body mechanics,Postural dysfunction,Increased muscle spasms,Impaired UE functional use,Decreased range of motion,Decreased strength,Hypomobility  Visit Diagnosis: Acute pain of left shoulder  Muscle weakness (generalized)     Problem List Patient Active Problem List   Diagnosis Date Noted  . Right rotator cuff tear 10/18/2019  . Arthritis of right acromioclavicular joint 09/05/2019  . Hyperglycemia 04/13/2019  . Subacromial bursitis of right shoulder joint 04/10/2019  . Osteopenia 01/19/2018  . Obesity (BMI 30.0-34.9) 12/08/2017  . Greater trochanteric bursitis of left hip 05/10/2016  .  IBS (irritable bowel syndrome) 07/08/2015  . Sigmoid diverticulitis 07/08/2015  . Hx of tobacco use, presenting hazards to health 07/08/2015  . Restless leg syndrome 07/05/2014  . Wheezing 05/30/2013  . Low back pain 08/03/2012  . Hyperlipidemia 05/28/2009  . TRICUSPID REGURGITATION 05/28/2009  . DEPRESSION 03/11/2009  . Allergic rhinitis 03/11/2009  . BREAST CANCER, HX OF 03/11/2009  . HEART MURMUR, HX OF 03/11/2009  . History of herpes genitalis 03/11/2009    Daleen Bo PT, DPT 01/07/21 1:45 PM   [facility] [address] [place], [place], [address] Phone: [phone]   Fax:  [phone]  Name: Tekia Waterbury MRN: [id number] Date of Birth: [birth year]   Referring diagnosis? M25.512 (ICD-10-CM) - Acute pain of left shoulder Treatment diagnosis? (if different than referring diagnosis) M62.81 What was this (referring dx) caused by? []  Surgery []  Fall []  Ongoing issue []  Arthritis [x]  Other: ____________  Laterality: []  Rt [x]  Lt []  Both  Check all possible CPT codes:      []  97110 (Therapeutic Exercise)  []  92507 (SLP Treatment)  []  97112 (Neuro Re-ed)   []  92526 (Swallowing Treatment)   []  97116 (Gait Training)   []  [id number] (Cognitive Training, 1st 15 minutes) []  97140 (Manual Therapy)   []  97130 (Cognitive Training, each add'l 15 minutes)  []  95284 (Therapeutic Activities)  []  Other, List CPT Code ____________    []  97535 (Self Care)       [x]  All codes above (97110 - 97535)  [x]  97012 (Mechanical Traction)  [x]  97014 (E-stim Unattended)  [x]  97032 (E-stim manual)  [x]  97033 (Ionto)  []  97035 (Ultrasound)  []  97760 (Orthotic Fit) []  97750 (Physical Performance Training) []  [id number] (Aquatic Therapy) []  97034 (Contrast Bath) []  [id number] (Paraffin) []  97597 (Wound Care 1st 20 sq cm) []  97598 (Wound Care each add'l 20 sq cm) []  97016 (Vasopneumatic Device) []  [id number] Comptroller) []  [id number] (Prosthetic Training)

## 2021-01-21 ENCOUNTER — Encounter: Payer: Self-pay | Admitting: Physical Therapy

## 2021-01-21 ENCOUNTER — Other Ambulatory Visit: Payer: Self-pay

## 2021-01-21 ENCOUNTER — Ambulatory Visit (INDEPENDENT_AMBULATORY_CARE_PROVIDER_SITE_OTHER): Payer: Medicare HMO | Admitting: Physical Therapy

## 2021-01-21 DIAGNOSIS — M25512 Pain in left shoulder: Secondary | ICD-10-CM | POA: Diagnosis not present

## 2021-01-21 DIAGNOSIS — M6281 Muscle weakness (generalized): Secondary | ICD-10-CM

## 2021-01-21 NOTE — Patient Instructions (Signed)
Access Code: [email or web address] URL: [email or web address] Date: 01/21/2021 Prepared by: Daleen Bo  Exercises Single Arm Doorway Pec Stretch at 90 Degrees Abduction - 2 x daily - 7 x weekly - 1 sets - 3 reps - 30 hold Standing Shoulder Posterior Capsule Stretch - 2 x daily - 7 x weekly - 1 sets - 3 reps - 30 hold Shoulder External Rotation and Scapular Retraction with Resistance - 1 x daily - 3-4 x weekly - 3 sets - 10 reps Standing Shoulder Row with Anchored Resistance - 1 x daily - 3-4 x weekly - 3 sets - 10 reps Sidelying Shoulder ER with Towel and Dumbbell - 1 x daily - 3-4 x weekly - 3 sets - 10 reps Push-Up on Counter - 1 x daily - 3-4 x weekly - 2 sets - 10 reps

## 2021-01-21 NOTE — Therapy (Signed)
[facility] [address] [place], [place], [address] Phone: [phone]   Fax:  [phone]  Physical Therapy Treatment  Patient Details  Name: Nicole Dawson MRN: [id number] Date of Birth: 1951-06-03 Referring Provider (PT): Dr. Georgina Snell   Encounter Date: 01/21/2021   PT End of Session - 01/21/21 0950     Visit Number 2    Number of Visits 15    Date for PT Re-Evaluation 04/07/21    Authorization Type [employer]    PT Start Time 0932    PT Stop Time 1012    PT Time Calculation (min) 40 min    Activity Tolerance Patient tolerated treatment well;No increased pain    Behavior During Therapy WFL for tasks assessed/performed             Past Medical History:  Diagnosis Date   Allergy    Breast cancer ([place])    Depression    Diverticulitis    Diverticulosis    Genital herpes    Heart murmur    HLD (hyperlipidemia)    Hx of tobacco use, presenting hazards to health 07/08/2015   22 years at 1 PPD   IBS (irritable bowel syndrome) 07/08/2015   Diagnosis by Dr. Conley Canal her prior PCP   PONV (postoperative nausea and vomiting)    Sigmoid diverticulitis 07/08/2015    Past Surgical History:  Procedure Laterality Date   [place] WITH AXILLARY LYMPH NODE DISSECTION  2005   bilateral mastectomy   BREAST RECONSTRUCTION  2005   TUBAL LIGATION  1984    There were no vitals filed for this visit.   Subjective Assessment - 01/21/21 0934     Subjective Pt states the pain is much better. Has had little to no pain while at work and has decreased pain with door opening and pt care.    Limitations Lifting    Currently in Pain? No/denies    Pain Score 0-No pain                               [facility] Adult PT Treatment/Exercise - 01/21/21 0001       Posture/Postural Control   Posture/Postural Control Postural limitations    Postural Limitations Rounded Shoulders;Increased  thoracic kyphosis      Exercises   Exercises Shoulder      Shoulder Exercises: Standing   External Rotation Limitations GTB 15x 2x10 cued for scap retraction    Theraband Level (Shoulder Row) Level 3 (Green)    Row Limitations 2x10 cued for scap retraction    Other Standing Exercises counter push up 2x10      Shoulder Exercises: Stretch   Other Shoulder Stretches post capsule stretching 30s 3x (cross body)    Other Shoulder Stretches pec stretch doorway 30s 3x; open book stretch 10x 5s hold each      Manual Therapy   Manual therapy comments STM L infra, ischemic pressure and cross friction                    PT Education - 01/21/21 1025     Education Details anatomy, exercise progression, joint protection, pt transfer mechanics, body mechanics, muscle firing, HEP update, POC    Person(s) Educated Patient    Methods Explanation;Demonstration;Tactile cues;Verbal cues;Handout    Comprehension Verbalized understanding;Returned demonstration;Verbal cues required;Tactile cues required  PT Short Term Goals - 01/07/21 1342       PT SHORT TERM GOAL #1   Title Pt will become independent with HEP in order to demonstrate synthesis of PT education.    Time 2    Period Weeks    Status New      PT SHORT TERM GOAL #2   Title Pt will be able to demonstrate full ability to reach full symmetrical OH ROM in order to demonstrate functional improvement in UE function for self-care and house hold duties.    Time 3    Period Weeks               PT Long Term Goals - 01/07/21 1343       PT LONG TERM GOAL #1   Title Pt will become independent with final HEP in order to demonstrate synthesis of PT education.    Time 8    Period Weeks    Status New      PT LONG TERM GOAL #2   Title Pt will be able to reach [facility] and grab/reach/hold >5 lbs in order to demonstrate functional improvement in L UE function.    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    Time 8    Period Weeks    Status New                   Plan - 01/21/21 6767     Clinical Impression Statement Pt demonstrates improvement in L shoulder strength and ROM as as compared to previous session. Pt was able to increase R shoulder strengthening exercise program today and progress to CKC stability and strenght without increased pain. Pt required VC and TC for decreased UT compensation. Pt is progressing well and able to drop frequency of visits at this time to facilitate more independence with HEP. Pt would benefit from continued skilled therapy in order to reach goals and maximize functional L shoulder strength and ROM for full return to PLOF.    Personal Factors and Comorbidities Age;Comorbidity 1;Comorbidity 2;Profession    Examination-Activity Limitations Bathing;Dressing;Carry;Reach Overhead    Examination-Participation Restrictions Occupation;Community Activity;Cleaning;Yard Work;Driving    Stability/Clinical Decision Making Stable/Uncomplicated    Rehab Potential Excellent    PT Frequency 2x / week   1-2x   PT Duration 12 weeks   Likely to D/C by 8   PT Treatment/Interventions ADLs/Self Care Home Management;Electrical Stimulation;Iontophoresis 4mg /ml Dexamethasone;Moist Heat;Traction;Ultrasound;Functional mobility training;Therapeutic activities;Therapeutic exercise;Neuromuscular re-education;Manual techniques;Passive range of motion;Dry needling;Taping;Joint Manipulations;Spinal Manipulations;Vasopneumatic Device    PT Next Visit Plan shoulder ER strengthening, STM and joint mob, S/L ER, wall push up, shoulder ABD rainbow, pec stretch    PT Home Exercise Plan Access Code: [email or web address]    Consulted and Agree with Plan of Care Patient             Patient will benefit from skilled therapeutic intervention in order  to improve the following deficits and impairments:  Pain, Improper body mechanics, Postural dysfunction, Increased muscle spasms, Impaired UE functional use, Decreased range of motion, Decreased strength, Hypomobility  Visit Diagnosis: Acute pain of left shoulder  Muscle weakness (generalized)     Problem List Patient Active Problem List   Diagnosis Date Noted   Right rotator cuff  tear 10/18/2019   Arthritis of right acromioclavicular joint 09/05/2019   Hyperglycemia 04/13/2019   Subacromial bursitis of right shoulder joint 04/10/2019   Osteopenia 01/19/2018   Obesity (BMI 30.0-34.9) 12/08/2017   Greater trochanteric bursitis of left hip 05/10/2016   IBS (irritable bowel syndrome) 07/08/2015   Sigmoid diverticulitis 07/08/2015   Hx of tobacco use, presenting hazards to health 07/08/2015   Restless leg syndrome 07/05/2014   Wheezing 05/30/2013   Low back pain 08/03/2012   Hyperlipidemia 05/28/2009   TRICUSPID REGURGITATION 05/28/2009   DEPRESSION 03/11/2009   Allergic rhinitis 03/11/2009   BREAST CANCER, HX OF 03/11/2009   HEART MURMUR, HX OF 03/11/2009   History of herpes genitalis 03/11/2009   Daleen Bo PT, DPT 01/21/21 11:15 AM   [facility] [place], [place], [address] Phone: [phone]   Fax:  [phone]  Name: Jakalyn Kratky MRN: [id number] Date of Birth: [birth year]

## 2021-01-22 ENCOUNTER — Encounter: Payer: Medicare HMO | Admitting: Physical Therapy

## 2021-01-29 ENCOUNTER — Encounter: Payer: Self-pay | Admitting: Physical Therapy

## 2021-01-29 ENCOUNTER — Ambulatory Visit (INDEPENDENT_AMBULATORY_CARE_PROVIDER_SITE_OTHER): Payer: Medicare HMO | Admitting: Physical Therapy

## 2021-01-29 ENCOUNTER — Encounter: Payer: Medicare HMO | Admitting: Physical Therapy

## 2021-01-29 ENCOUNTER — Other Ambulatory Visit: Payer: Self-pay

## 2021-01-29 DIAGNOSIS — M25512 Pain in left shoulder: Secondary | ICD-10-CM

## 2021-01-29 DIAGNOSIS — M6281 Muscle weakness (generalized): Secondary | ICD-10-CM | POA: Diagnosis not present

## 2021-01-29 NOTE — Therapy (Signed)
[facility] [address] [place], [place], [address] Phone: [phone]   Fax:  [phone]  Physical Therapy Discharge  Patient Details  Name: Nicole Dawson MRN: [id number] Date of Birth: Jul 16, 1951 Referring Provider (PT): Dr. Georgina Snell   Encounter Date: 01/29/2021   PT End of Session - 01/29/21 0850     Visit Number 3    Number of Visits 15    Date for PT Re-Evaluation 04/07/21    Authorization Type [employer]    PT Start Time 0846    PT Stop Time 0910    PT Time Calculation (min) 24 min    Activity Tolerance Patient tolerated treatment well;No increased pain    Behavior During Therapy WFL for tasks assessed/performed             Past Medical History:  Diagnosis Date   Allergy    Breast cancer ([place])    Depression    Diverticulitis    Diverticulosis    Genital herpes    Heart murmur    HLD (hyperlipidemia)    Hx of tobacco use, presenting hazards to health 07/08/2015   22 years at 1 PPD   IBS (irritable bowel syndrome) 07/08/2015   Diagnosis by Dr. Conley Canal her prior PCP   PONV (postoperative nausea and vomiting)    Sigmoid diverticulitis 07/08/2015    Past Surgical History:  Procedure Laterality Date   [place] WITH AXILLARY LYMPH NODE DISSECTION  2005   bilateral mastectomy   BREAST RECONSTRUCTION  2005   TUBAL LIGATION  1984    There were no vitals filed for this visit.   Subjective Assessment - 01/29/21 0850     Subjective Pt reports no pain since last session and no issues with work related duties.    Limitations Lifting    Currently in Pain? No/denies    Pain Score 0-No pain                [facility] PT Assessment - 01/29/21 0001       Assessment   Medical Diagnosis M25.512 (ICD-10-CM) - Acute pain of left shoulder    Referring Provider (PT) Dr. Georgina Snell    Hand Dominance Right    Next MD Visit 6 weeks    Prior Therapy N/A      Precautions    Precautions None      Restrictions   Weight Bearing Restrictions No      Balance Screen   Has the patient fallen in the past 6 months No      [place] residence      Prior Function   Level of Independence Independent      Cognition   Overall Cognitive Status Within Functional Limits for tasks assessed      Observation/Other Assessments   Other Surveys  Quick Dash    Quick DASH  0%  (0/100)      AROM   Overall AROM Comments R shoulder WFL    Left Shoulder Extension 59 Degrees    Left Shoulder Flexion 156 Degrees    Left Shoulder ABduction 159 Degrees    Left Shoulder Internal Rotation 71 Degrees    Left Shoulder External Rotation 78 Degrees      PROM   Overall PROM Comments L shoulder WFL      Strength   Overall Strength Comments L shoulder 4+/5 throughout      Palpation   Palpation  comment No TTP                                                                                                       All exercises reviewed for home. Self progression discussed and exercise bands provided.     [facility] PT Treatment/Exercise - 01/29/21 0001       Posture/Postural Control   Posture/Postural Control Postural limitations    Postural Limitations Rounded Shoulders;Increased thoracic kyphosis      Exercises   Exercises Shoulder      Shoulder Exercises: Standing   External Rotation Limitations GTB 15x 2x10 cued for scap retraction    Theraband Level (Shoulder Row) Level 3 (Green)    Row Limitations 2x10 cued for scap retraction    Other Standing Exercises counter push up 2x10    Other Standing Exercises wall angel 10x      Shoulder Exercises: Stretch   Other Shoulder Stretches post capsule stretching 30s 3x (cross body)    Other Shoulder Stretches pec stretch doorway 30s 3x; open book stretch 10x 5s hold each      Manual Therapy   Manual therapy comments STM L infra, ischemic pressure and  cross friction                    PT Education - 01/29/21 0918     Education Details joint protection, pt transfer mechanics, body mechanics, DOMS, exercise self progression/taper, HEP update, POC, D/C    Person(s) Educated Patient    Methods Explanation;Demonstration;Verbal cues;Tactile cues;Handout    Comprehension Verbalized understanding;Returned demonstration;Verbal cues required;Tactile cues required              PT Short Term Goals - 01/29/21 0921       PT SHORT TERM GOAL #1   Title Pt will become independent with HEP in order to demonstrate synthesis of PT education.    Time 2    Period Weeks    Status Achieved      PT SHORT TERM GOAL #2   Title Pt will be able to demonstrate full ability to reach full symmetrical OH ROM in order to demonstrate functional improvement in UE function for self-care and house hold duties.    Time 3    Period Weeks    Status Achieved               PT Long Term Goals - 01/29/21 8280       PT LONG TERM GOAL #1   Title Pt will become independent with final HEP in order to demonstrate synthesis of PT education.    Time 8    Period Weeks    Status Achieved      PT LONG TERM GOAL #2   Title Pt will be able to reach [facility] and grab/reach/hold >5 lbs in order to demonstrate functional improvement in L UE function.    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    Time 8    Period Weeks    Status Achieved                   Plan - 01/29/21 0919     Clinical Impression Statement Pt demonstrates signficant improvement with L shoulder strength, ROM, and function as demonstrated by objective measures and Quick DASH. Pt with no functional limitations while at work or with ADL. Pt gives verbal understanding to self exercise progression  and taper. Pt with no further needs for skilled therapy. D/C this episode of care.    Personal Factors and Comorbidities Age;Comorbidity 1;Comorbidity 2;Profession    Examination-Activity Limitations Bathing;Dressing;Carry;Reach Overhead    Examination-Participation Restrictions Occupation;Community Activity;Cleaning;Yard Work;Driving    Stability/Clinical Decision Making Stable/Uncomplicated    Rehab Potential Excellent    PT Frequency 2x / week   1-2x   PT Duration 12 weeks   Likely to D/C by 8   PT Treatment/Interventions ADLs/Self Care Home Management;Electrical Stimulation;Iontophoresis 21m/ml Dexamethasone;Moist Heat;Traction;Ultrasound;Functional mobility training;Therapeutic activities;Therapeutic exercise;Neuromuscular re-education;Manual techniques;Passive range of motion;Dry needling;Taping;Joint Manipulations;Spinal Manipulations;Vasopneumatic Device    PT Home Exercise Plan Access Code: [email or web address]   Consulted and Agree with Plan of Care Patient             Patient will benefit from skilled therapeutic intervention in order to improve the following deficits and impairments:  Pain, Improper body mechanics, Postural dysfunction, Increased muscle spasms, Impaired UE functional use, Decreased range of motion, Decreased strength, Hypomobility  Visit Diagnosis: Acute pain of left shoulder  Muscle weakness (generalized)     Problem List Patient Active Problem List   Diagnosis Date Noted   Right rotator cuff tear 10/18/2019   Arthritis of right acromioclavicular joint 09/05/2019   Hyperglycemia 04/13/2019   Subacromial bursitis of right shoulder joint 04/10/2019   Osteopenia 01/19/2018   Obesity (BMI 30.0-34.9) 12/08/2017   Greater trochanteric bursitis of left hip 05/10/2016   IBS (irritable bowel syndrome) 07/08/2015   Sigmoid diverticulitis 07/08/2015   Hx of tobacco use, presenting hazards to health 07/08/2015   Restless leg syndrome 07/05/2014   Wheezing 05/30/2013    Low back pain 08/03/2012   Hyperlipidemia 05/28/2009   TRICUSPID REGURGITATION 05/28/2009   DEPRESSION 03/11/2009   Allergic rhinitis 03/11/2009   BREAST CANCER, HX OF 03/11/2009   HEART MURMUR, HX OF 03/11/2009   History of herpes genitalis 03/11/2009   ADaleen BoPT, DPT 01/29/21 9:23 AM   [facility] [address]Phone: [phone]  Fax:  [phone] Name: Nicole ErgleMRN: [id number] of Birth: [birth year]  PHYSICAL THERAPY DISCHARGE SUMMARY  Visits from Start of Care: 3   Plan: Patient agrees to discharge.  Patient goals were met. Patient is being discharged due to meeting the stated rehab goals.

## 2021-01-29 NOTE — Patient Instructions (Signed)
Access Code: [email or web address] URL: [email or web address] Date: 01/29/2021 Prepared by: Daleen Bo  Exercises Single Arm Doorway Pec Stretch at 90 Degrees Abduction - 2 x daily - 7 x weekly - 1 sets - 3 reps - 30 hold Standing Shoulder Posterior Capsule Stretch - 2 x daily - 7 x weekly - 1 sets - 3 reps - 30 hold Wall Angels - 2 x daily - 7 x weekly - 2 sets - 10 reps - 2 hold Shoulder External Rotation and Scapular Retraction with Resistance - 1 x daily - 3-4 x weekly - 3 sets - 10 reps Standing Shoulder Row with Anchored Resistance - 1 x daily - 3-4 x weekly - 3 sets - 10 reps Sidelying Shoulder ER with Towel and Dumbbell - 1 x daily - 3-4 x weekly - 3 sets - 10 reps Push-Up on Counter - 1 x daily - 3-4 x weekly - 2 sets - 10 reps

## 2021-02-04 ENCOUNTER — Encounter: Payer: Medicare HMO | Admitting: Physical Therapy

## 2021-02-11 ENCOUNTER — Encounter: Payer: Medicare HMO | Admitting: Physical Therapy

## 2021-02-13 ENCOUNTER — Ambulatory Visit: Payer: TRICARE For Life (TFL) | Admitting: Family Medicine

## 2021-02-13 NOTE — Progress Notes (Deleted)
   I, [name], LAT, ATC, am serving as scribe for Dr. Lynne Leader.  Nicole Dawson is a 70 y.o. female who presents to [facility] today for f/u L shoulder pain, thought to be due to subacromial bursitis, ongoing since 12/30/20 w/ no known MOI. Pt works at [employer] and states that there are very heavy doors and suspects maybe pulling a door too hard. Pt was last seen by Dr. Georgina Snell on 01/02/21 and was given a L subacromial steroid injection, was taught HEP, and was referred to PT of which she's completed 3 visits. Today, pt reports   Pertinent review of systems: ***  Relevant historical information: ***   Exam:  There were no vitals taken for this visit. General: Well Developed, well nourished, and in no acute distress.   MSK: ***    Lab and Radiology Results No results found for this or any previous visit (from the past 72 hour(s)). No results found.     Assessment and Plan: 70 y.o. female with ***   PDMP not reviewed this encounter. No orders of the defined types were placed in this encounter.  No orders of the defined types were placed in this encounter.    Discussed warning signs or symptoms. Please see discharge instructions. Patient expresses understanding.   ***

## 2021-02-18 ENCOUNTER — Encounter: Payer: Medicare HMO | Admitting: Physical Therapy

## 2021-02-23 ENCOUNTER — Encounter: Payer: Medicare HMO | Admitting: Physical Therapy

## 2021-03-16 ENCOUNTER — Telehealth: Payer: Self-pay

## 2021-03-16 NOTE — Telephone Encounter (Signed)
ERROR

## 2021-04-15 ENCOUNTER — Telehealth: Payer: Self-pay | Admitting: Family Medicine

## 2021-04-15 NOTE — Telephone Encounter (Signed)
Spoke with patient she stated to she will CB.  Called patient to schedule Annual Wellness Visit.  Please schedule with Nurse Health Advisor Charlott Rakes, RN at [facility].    Please call [phone] ask for [facility]

## 2021-04-21 ENCOUNTER — Telehealth: Payer: Self-pay | Admitting: Family Medicine

## 2021-04-21 NOTE — Telephone Encounter (Signed)
Attempted to schedule AWV. Unable to LVM.  Will try at later time. Patient mailbox is full  

## 2021-05-15 ENCOUNTER — Other Ambulatory Visit: Payer: Self-pay

## 2021-05-15 ENCOUNTER — Encounter: Payer: Self-pay | Admitting: Physician Assistant

## 2021-05-15 ENCOUNTER — Ambulatory Visit (INDEPENDENT_AMBULATORY_CARE_PROVIDER_SITE_OTHER): Payer: Medicare HMO | Admitting: Physician Assistant

## 2021-05-15 VITALS — BP 114/71 | HR 90 | Temp 98.0°F | Ht 61.0 in | Wt 168.2 lb

## 2021-05-15 DIAGNOSIS — K5732 Diverticulitis of large intestine without perforation or abscess without bleeding: Secondary | ICD-10-CM

## 2021-05-15 DIAGNOSIS — Z23 Encounter for immunization: Secondary | ICD-10-CM

## 2021-05-15 MED ORDER — AMOXICILLIN-POT CLAVULANATE 875-125 MG PO TABS: 1.0000 | ORAL_TABLET | Freq: Two times a day (BID) | ORAL | 0 refills | Status: DC

## 2021-05-15 NOTE — Patient Instructions (Addendum)
I think this may be diverticulitis flaring up again.  BRATY diet, push fluids, take the Augmentin with food. Please call or MyChart with update for [place] on Monday  ED this weekend if acutely worse  Flu shot in office today.

## 2021-05-15 NOTE — Progress Notes (Signed)
Subjective:    Patient ID: Nicole Dawson, female    DOB: 28-Sep-1950, 70 y.o.   MRN: [id number]  Chief Complaint  Patient presents with   Irritable Bowel Syndrome    Mucous in stool     Abdominal Pain    HPI Patient is in today for IBS and abdominal pain symptoms x 2 days.  Suprapubic and lower abdominal pressure pain. She hasn't had a bowel movement yet today. No nausea or vomiting. She has potatoes and Pepsi last night because that's all she had a taste for. 2016 - admitted to hospital for sigmoid diverticulitis. Last weekend drove to [place] with husband in new care and says she was stressed about driving. Denies any diarrhea, blood in stool, fever, chills, or other symptoms at this time.  Past Medical History:  Diagnosis Date   Allergy    Breast cancer ([place])    Depression    Diverticulitis    Diverticulosis    Genital herpes    Heart murmur    HLD (hyperlipidemia)    Hx of tobacco use, presenting hazards to health 07/08/2015   22 years at 1 PPD   IBS (irritable bowel syndrome) 07/08/2015   Diagnosis by Dr. Conley Canal her prior PCP   PONV (postoperative nausea and vomiting)    Sigmoid diverticulitis 07/08/2015    Past Surgical History:  Procedure Laterality Date   APPENDECTOMY  1962   BILATERAL TOTAL MASTECTOMY WITH AXILLARY LYMPH NODE DISSECTION  2005   bilateral mastectomy   BREAST RECONSTRUCTION  2005   TUBAL LIGATION  1984    Family History  Problem Relation Age of Onset   Hyperlipidemia Mother    Hypertension Mother    COPD Mother    Stroke Mother        in 27s.    Heart attack Father        87.    Tuberculosis Father        1/2 of lung removed.    Hyperlipidemia Father    Lymphoma Maternal Aunt    Skin cancer Paternal Grandmother     Social History   Tobacco Use   Smoking status: Former    Packs/day: 1.00    Years: 30.00    Pack years: 30.00    Types: Cigarettes    Quit date: 07/07/1992    Years since quitting: 28.8   Smokeless tobacco: Never   Substance Use Topics   Alcohol use: No   Drug use: No     No Known Allergies  Review of Systems REFER TO HPI FOR PERTINENT POSITIVES AND NEGATIVES      Objective:     BP 114/71   Pulse 90   Temp 98 F (36.7 C)   Ht 5\' 1"  (1.549 m)   Wt 168 lb 3.2 oz (76.3 kg)   SpO2 95%   BMI 31.78 kg/m   Wt Readings from Last 3 Encounters:  05/15/21 168 lb 3.2 oz (76.3 kg)  01/02/21 168 lb 6.4 oz (76.4 kg)  10/23/19 161 lb 8 oz (73.3 kg)    BP Readings from Last 3 Encounters:  05/15/21 114/71  01/02/21 124/78  01/01/21 (!) 142/86     Physical Exam Vitals and nursing note reviewed.  Constitutional:      Appearance: She is well-developed.  HENT:     Head: Normocephalic.  Cardiovascular:     Rate and Rhythm: Normal rate and regular rhythm.     Heart sounds: Normal heart sounds. No murmur heard. Pulmonary:  Effort: Pulmonary effort is normal.     Breath sounds: Normal breath sounds.  Abdominal:     General: Bowel sounds are normal.     Palpations: Abdomen is soft.     Tenderness: There is abdominal tenderness in the right lower quadrant, suprapubic area and left lower quadrant. There is no right CVA tenderness, left CVA tenderness, guarding or rebound. Negative signs include Murphy's sign and McBurney's sign.  Neurological:     Mental Status: She is alert.       Assessment & Plan:   Problem List Items Addressed This Visit       Digestive   Sigmoid diverticulitis - Primary   Relevant Medications   amoxicillin-clavulanate (AUGMENTIN) 875-125 MG tablet   Other Visit Diagnoses     Need for immunization against influenza       Relevant Orders   Flu Vaccine QUAD High Dose(Fluad) (Completed)        Meds ordered this encounter  Medications   amoxicillin-clavulanate (AUGMENTIN) 875-125 MG tablet    Sig: Take 1 tablet by mouth 2 (two) times daily.    Dispense:  20 tablet    Refill:  0    1. Sigmoid diverticulitis -I personally reviewed her history with  diverticulitis and symptoms align with early signs of this today. -Vital signs stable, no acute abdomen, hold on CT at this time, and will treat empirically with Augmentin -BRATY diet, fluids -Low threshold for ED this weekend if worse  2. Need for immunization against influenza -Flu vaccine updated in office   This note was prepared with assistance of Dragon voice recognition software. Occasional wrong-word or sound-a-like substitutions may have occurred due to the inherent limitations of voice recognition software.  Time Spent: 24 minutes of total time was spent on the date of the encounter performing the following actions: chart review prior to seeing the patient, obtaining history, performing a medically necessary exam, counseling on the treatment plan, placing orders, and documenting in our EHR.    Koltin Wehmeyer M Torin Modica, PA-C

## 2021-05-25 DIAGNOSIS — Z9889 Other specified postprocedural states: Secondary | ICD-10-CM | POA: Diagnosis not present

## 2021-07-02 DIAGNOSIS — Z6830 Body mass index (BMI) 30.0-30.9, adult: Secondary | ICD-10-CM | POA: Diagnosis not present

## 2021-07-02 DIAGNOSIS — Z124 Encounter for screening for malignant neoplasm of cervix: Secondary | ICD-10-CM | POA: Diagnosis not present

## 2021-07-02 DIAGNOSIS — Z01419 Encounter for gynecological examination (general) (routine) without abnormal findings: Secondary | ICD-10-CM | POA: Diagnosis not present

## 2021-07-12 ENCOUNTER — Other Ambulatory Visit: Payer: Self-pay | Admitting: Family Medicine

## 2021-07-14 DIAGNOSIS — N3281 Overactive bladder: Secondary | ICD-10-CM | POA: Diagnosis not present

## 2021-07-14 DIAGNOSIS — N8111 Cystocele, midline: Secondary | ICD-10-CM | POA: Diagnosis not present

## 2021-07-21 ENCOUNTER — Other Ambulatory Visit: Payer: Self-pay | Admitting: Family Medicine

## 2021-08-06 ENCOUNTER — Telehealth: Payer: Self-pay | Admitting: Family Medicine

## 2021-08-06 MED ORDER — ATORVASTATIN CALCIUM 20 MG PO TABS: ORAL_TABLET | ORAL | 3 refills | Status: DC

## 2021-08-06 MED ORDER — GABAPENTIN 100 MG PO CAPS: 100.0000 mg | ORAL_CAPSULE | Freq: Three times a day (TID) | ORAL | 3 refills | Status: DC

## 2021-08-06 NOTE — Telephone Encounter (Signed)
Pt called and stated her pharmacy needs new rx for the medications below. Pt would like 90day supply.  Medication:  gabapentin (NEURONTIN) 100 MG capsule   atorvastatin (LIPITOR) 20 MG tablet   Has the patient contacted their pharmacy? Yes.   (If no, request that the patient contact the pharmacy for the refill.) (If yes, when and what did the pharmacy advise?)  Preferred Pharmacy (with phone number or street name): [facility] [place]  [address] [place] [address]  Phone:  [phone]  Fax:  [phone]   Agent: Please be advised that RX refills may take up to 3 business days. We ask that you follow-up with your pharmacy.

## 2021-08-06 NOTE — Telephone Encounter (Signed)
Rx refilled.

## 2021-08-14 DIAGNOSIS — L821 Other seborrheic keratosis: Secondary | ICD-10-CM | POA: Diagnosis not present

## 2021-08-14 DIAGNOSIS — D1801 Hemangioma of skin and subcutaneous tissue: Secondary | ICD-10-CM | POA: Diagnosis not present

## 2021-08-14 DIAGNOSIS — Z789 Other specified health status: Secondary | ICD-10-CM | POA: Diagnosis not present

## 2021-08-14 DIAGNOSIS — L820 Inflamed seborrheic keratosis: Secondary | ICD-10-CM | POA: Diagnosis not present

## 2021-08-14 DIAGNOSIS — L538 Other specified erythematous conditions: Secondary | ICD-10-CM | POA: Diagnosis not present

## 2021-08-14 DIAGNOSIS — L718 Other rosacea: Secondary | ICD-10-CM | POA: Diagnosis not present

## 2021-08-14 DIAGNOSIS — L720 Epidermal cyst: Secondary | ICD-10-CM | POA: Diagnosis not present

## 2021-08-14 DIAGNOSIS — L814 Other melanin hyperpigmentation: Secondary | ICD-10-CM | POA: Diagnosis not present

## 2021-09-07 ENCOUNTER — Other Ambulatory Visit: Payer: Self-pay

## 2021-09-07 ENCOUNTER — Encounter: Payer: Self-pay | Admitting: Family Medicine

## 2021-09-07 ENCOUNTER — Ambulatory Visit (INDEPENDENT_AMBULATORY_CARE_PROVIDER_SITE_OTHER): Payer: Medicare HMO | Admitting: Family Medicine

## 2021-09-07 VITALS — BP 124/68 | HR 72 | Temp 97.7°F | Ht 61.0 in | Wt 170.8 lb

## 2021-09-07 DIAGNOSIS — M79671 Pain in right foot: Secondary | ICD-10-CM

## 2021-09-07 DIAGNOSIS — R739 Hyperglycemia, unspecified: Secondary | ICD-10-CM | POA: Diagnosis not present

## 2021-09-07 DIAGNOSIS — E785 Hyperlipidemia, unspecified: Secondary | ICD-10-CM

## 2021-09-07 DIAGNOSIS — Z0000 Encounter for general adult medical examination without abnormal findings: Secondary | ICD-10-CM | POA: Diagnosis not present

## 2021-09-07 DIAGNOSIS — M85852 Other specified disorders of bone density and structure, left thigh: Secondary | ICD-10-CM | POA: Diagnosis not present

## 2021-09-07 LAB — COMPREHENSIVE METABOLIC PANEL
ALT: 32 U/L (ref 0–35)
AST: 23 U/L (ref 0–37)
Albumin: 4 g/dL (ref 3.5–5.2)
Alkaline Phosphatase: 99 U/L (ref 39–117)
BUN: 24 mg/dL — ABNORMAL HIGH (ref 6–23)
CO2: 33 mEq/L — ABNORMAL HIGH (ref 19–32)
Calcium: 9.3 mg/dL (ref 8.4–10.5)
Chloride: 103 mEq/L (ref 96–112)
Creatinine, Ser: 0.74 mg/dL (ref 0.40–1.20)
GFR: 82.99 mL/min (ref >60.00)
Glucose, Bld: 85 mg/dL (ref 70–99)
Potassium: 4 mEq/L (ref 3.5–5.1)
Sodium: 142 mEq/L (ref 135–145)
Total Bilirubin: 1.3 mg/dL — ABNORMAL HIGH (ref 0.2–1.2)
Total Protein: 6.6 g/dL (ref 6.0–8.3)

## 2021-09-07 LAB — HEMOGLOBIN A1C: Hgb A1c MFr Bld: 5.5 % (ref 4.6–6.5)

## 2021-09-07 LAB — LIPID PANEL
Cholesterol: 201 mg/dL — ABNORMAL HIGH (ref 0–200)
HDL: 50.9 mg/dL (ref >39.00)
LDL Cholesterol: 112 mg/dL — ABNORMAL HIGH (ref 0–99)
NonHDL: 150.54
Total CHOL/HDL Ratio: 4
Triglycerides: 191 mg/dL — ABNORMAL HIGH (ref 0.0–149.0)
VLDL: 38.2 mg/dL (ref 0.0–40.0)

## 2021-09-07 LAB — CBC WITH DIFFERENTIAL/PLATELET
Basophils Absolute: 0 10*3/uL (ref 0.0–0.1)
Basophils Relative: 0.5 % (ref 0.0–3.0)
Eosinophils Absolute: 0.1 10*3/uL (ref 0.0–0.7)
Eosinophils Relative: 1.9 % (ref 0.0–5.0)
HCT: 41 % (ref 36.0–46.0)
Hemoglobin: 13.7 g/dL (ref 12.0–15.0)
Lymphocytes Relative: 23.9 % (ref 12.0–46.0)
Lymphs Abs: 1.5 10*3/uL (ref 0.7–4.0)
MCHC: 33.3 g/dL (ref 30.0–36.0)
MCV: 95.4 fl (ref 78.0–100.0)
Monocytes Absolute: 0.6 10*3/uL (ref 0.1–1.0)
Monocytes Relative: 9.9 % (ref 3.0–12.0)
Neutro Abs: 4 10*3/uL (ref 1.4–7.7)
Neutrophils Relative %: 63.8 % (ref 43.0–77.0)
Platelets: 248 10*3/uL (ref 150.0–400.0)
RBC: 4.3 Mil/uL (ref 3.87–5.11)
RDW: 13.1 % (ref 11.5–15.5)
WBC: 6.2 10*3/uL (ref 4.0–10.5)

## 2021-09-07 MED ORDER — MIRABEGRON ER 25 MG PO TB24: 25.0000 mg | ORAL_TABLET | Freq: Every day | ORAL | 3 refills | Status: DC

## 2021-09-07 NOTE — Progress Notes (Signed)
Phone [phone]   Subjective:  Patient presents today for their annual physical. Chief complaint-noted.   See problem oriented charting- ROS- full  review of systems was completed and negative except for: urinary urgency, seasonal allergies, right foot pain   The following were reviewed and entered/updated in epic: Past Medical History:  Diagnosis Date   Allergy    Breast cancer ([place])    Depression    Diverticulitis    Diverticulosis    Genital herpes    Heart murmur    HLD (hyperlipidemia)    Hx of tobacco use, presenting hazards to health 07/08/2015   22 years at 1 PPD   IBS (irritable bowel syndrome) 07/08/2015   Diagnosis by Dr. Conley Canal her prior PCP   PONV (postoperative nausea and vomiting)    Sigmoid diverticulitis 07/08/2015   Patient Active Problem List   Diagnosis Date Noted   IBS (irritable bowel syndrome) 07/08/2015    Priority: Medium    Restless leg syndrome 07/05/2014    Priority: Medium    Hyperlipidemia 05/28/2009    Priority: Medium    BREAST CANCER, HX OF 03/11/2009    Priority: Medium    Obesity (BMI 30.0-34.9) 12/08/2017    Priority: Low   Greater trochanteric bursitis of left hip 05/10/2016    Priority: Low   Sigmoid diverticulitis 07/08/2015    Priority: Low   Hx of tobacco use, presenting hazards to health 07/08/2015    Priority: Low   Wheezing 05/30/2013    Priority: Low   Low back pain 08/03/2012    Priority: Low   TRICUSPID REGURGITATION 05/28/2009    Priority: Low   DEPRESSION 03/11/2009    Priority: Low   Allergic rhinitis 03/11/2009    Priority: [place], HX OF 03/11/2009    Priority: Low   History of herpes genitalis 03/11/2009    Priority: Low   Right rotator cuff tear 10/18/2019   Arthritis of right acromioclavicular joint 09/05/2019   Hyperglycemia 04/13/2019   Subacromial bursitis of right shoulder joint 04/10/2019   Osteopenia 01/19/2018   Past Surgical History:  Procedure Laterality Date    APPENDECTOMY  1962   BILATERAL TOTAL MASTECTOMY WITH AXILLARY LYMPH NODE DISSECTION  2005   bilateral mastectomy   BREAST RECONSTRUCTION  2005   TUBAL LIGATION  1984    Family History  Problem Relation Age of Onset   Hyperlipidemia Mother    Hypertension Mother    COPD Mother    Stroke Mother        in 27s.    Heart attack Father        68.    Tuberculosis Father        1/2 of lung removed.    Hyperlipidemia Father    Lymphoma Maternal Aunt    Skin cancer Paternal Grandmother     Medications- reviewed and updated Current Outpatient Medications  Medication Sig Dispense Refill   aspirin EC 81 MG tablet Take 81 mg by mouth at bedtime.      atorvastatin (LIPITOR) 20 MG tablet TAKE 1 TABLET ONCE A WEEK 26 tablet 3   Biotin 10000 MCG TABS Take 1 tablet by mouth daily.     Carboxymethylcellul-Glycerin (LUBRICATING EYE DROPS OP) Apply 1 drop to eye as needed (dry eyes).      cetirizine (ZYRTEC) 10 MG tablet Take 10 mg by mouth daily as needed for allergies.     cholecalciferol (VITAMIN D) 1000 units tablet Take 1,000 Units by mouth 2 (  two) times daily at 8 am and 10 pm.     cyanocobalamin 1000 MCG tablet Take 1,000 mcg by mouth daily.     gabapentin (NEURONTIN) 100 MG capsule Take 1 capsule (100 mg total) by mouth 3 (three) times daily. 270 capsule 3   ketotifen (ZADITOR) 0.025 % ophthalmic solution Place 1 drop into both eyes daily.     montelukast (SINGULAIR) 10 MG tablet Take 10 mg by mouth at bedtime.     Multiple Vitamin (MULTIVITAMIN) tablet Take 1 tablet by mouth daily.     nitroGLYCERIN (NITRO-DUR) 0.1 mg/hr patch 1/4 patch daily 30 patch 12   OVER THE COUNTER MEDICATION Caltrate     triamcinolone (NASACORT AQ) 55 MCG/ACT AERO nasal inhaler Place 2 sprays into the nose daily. 1 Inhaler 12   valACYclovir (VALTREX) 500 MG tablet Take 500 mg by mouth at bedtime.      No current facility-administered medications for this visit.    Allergies-reviewed and updated No Known  Allergies  Social History   Social History Narrative   Lives in [place], previously lived in [place] until 2020. Married 2007. 2 kids (son and daughter). 2 grandkids (granddaughter and grandson). Both in [place]. Grandson on on the way (son's) in April 2016.       Works at [employer]- a branch of [employer].    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       Hobbies: computer games, tv   Objective  Objective:  BP 124/68    Pulse 72    Temp 97.7 F (36.5 C)    Ht 5\' 1"  (1.549 m)    Wt 170 lb 12.8 oz (77.5 kg)    SpO2 99%    BMI 32.27 kg/m  Gen: NAD, resting comfortably HEENT: Mucous membranes are moist. Oropharynx normal Neck: no thyromegaly CV: RRR no murmurs rubs or gallops Lungs: CTAB no crackles, wheeze, rhonchi Abdomen: soft/nontender/nondistended/normal bowel sounds. No rebound or guarding.  Ext: no edema Skin: warm, dry Neuro: grossly normal, moves all extremities, PERRLA Msk: normal ROM hips- mild pain on right hip exam with internal rotation. I could not reproduce foot pain well- there is certainly some. Negative straight leg raise. Negative Stinchfield test.   Assessment and Plan   71 y.o. female presenting for annual physical.  Health Maintenance counseling: 1. Anticipatory guidance: Patient counseled regarding regular dental exams -q6 months, eye exams - every other yearly,  avoiding smoking and second hand smoke , limiting alcohol to 1 beverage per day , no illicit drugs.   2. Risk factor reduction:  Advised patient of need for regular exercise and diet rich and fruits and vegetables to reduce risk of heart attack and stroke.  Exercise- not exercising outside of being active at work- discussed once foot is better restarting with long term goal 150 minutes for long term health.  Diet/weight management-weight is up 19 pounds since last physical-we discussed reversing the trend with healthy habits-previously overweight now and obesity  range-she had lost 13 pounds by time of last physical. She also has silver sneakers and thinks she can add Wt Readings from Last 3 Encounters:  09/07/21 170 lb 12.8 oz (77.5 kg)  05/15/21 168 lb 3.2 oz (76.3 kg)  01/02/21 168 lb 6.4 oz (76.4 kg)  3. Immunizations/screenings/ancillary studies-discussed Shingrix at [facility] has already had this in team will get recordsas well as Tdap- still needs  Immunization History  Administered Date(s) Administered   Fluad Quad(high Dose 65+)  04/13/2019, 05/15/2021   Hepatitis B 04/02/2013   Influenza,inj,Quad PF,6+ Mos 05/29/2018   Influenza-Unspecified 04/02/2013, 05/02/2014, 06/11/2014, 06/18/2015, 06/02/2016, 03/28/2017, 05/03/2019   PFIZER(Purple Top)SARS-COV-2 Vaccination 07/24/2019, 08/15/2019   Pneumococcal Conjugate-13 03/24/2018   Pneumococcal Polysaccharide-23 04/13/2019   Td 05/28/2009   Zoster, Live 07/27/2016  4. Cervical cancer screening- past age based screening recommendations-previously followed by Dr. Stann Mainland with last Pap 2017 on file- she reports she still has paps though 5. Breast cancer screening-  breast exam with gynecologyand plastic surgery and mammogram -not done due to prophylactic mastectomy with history of breast cancer in 2000-follow [facility] plastic surgery 6. Colon cancer screening - 11/26/2015 with hyperplastic polyp continue follow-up plan 7. Skin cancer screening-no issues when saw dermatology recently. advised regular sunscreen use. Denies worrisome, changing, or new skin lesions.  8. Birth control/STD check- postmenopausal/monogamous 9. Osteoporosis screening at 21- see osteopenia section 10. Smoking associated screening -former smoker-quit over 20 years ago.  With 22 pack years.  Status of chronic or acute concerns   #Right foot pain S: Patient reports right foot pain that shoots up her leg starting back in march- lines up when bought a new car. Pain up to 7/10. Better with rest. Worse with activity at work.    -they bought a tesla and constantly requires her right foot a lot more- wonders if that contributed. She also reports her last 2 toes curl under. Also does a lot of standing at work and turns feet outward slightly. Compression stockings generally help swelling but she has noted some pain in toes 4 and 5 lately. Foot is the worst but occasionally gets pain into her leg- calf and back of leg.  -Has worked with sports medicine in the past for various musculoskeletal issues A/P: unclear cause of pain on exam- I would like to get expert opinion of sports medicine   #Microscopic hematuria #OAB S: Patient with 3-6 RBCs per high-power field on 05/02/2019 and referred to urology--largely unremarkable work-up she reports cystoscopy and CT and was found to have a cystocele -Most recently saw Dr. Louis Meckel on 07/14/2021-they discussed some overactive bladder symptoms and discussed Myrbetriq-recommendation was for as needed follow-up only A/P:  microscopic hematuria workup was reassuring- see above For OAB- we will take over myrbetriq rx since she was released by urology- she will monitor BP at home goal <135/85  #hyperlipidemia S: Medication:Atorvastatin 20 mg once weekly-targeting LDL at least under 100 but also has prediabetes so we are being cautious -plan was twice a week but sig remained once a week and that is what she is taking.  Lab Results  Component Value Date   CHOL 179 04/13/2019   HDL 64.70 04/13/2019   LDLCALC 75 04/13/2019   LDLDIRECT 94.6 07/05/2014   TRIG 194.0 (H) 04/13/2019   CHOLHDL 3 04/13/2019  A/P: update lipids- continue current meds-we discussed if triglycerides over 400 will need to be fasting in the future   # Low Bone density (formerly osteopenia) S: Last DEXA: 01/19/2018 with worst T score -2.0 left femur neck  Calcium: 1200mg  (through diet ok) recommended - she is taking Vitamin D: 1000 units a day recommended- she is taking above this A/P: hopefully stable- update dexa    # Hyperglycemia/insulin resistance/prediabetes-peak A1c 5.7 in 2016 S:  Medication: none  A/P: hopefully stable or improved- has gained some weight so will update a1c- continue lifestyle changes  # Depression S: Medication:None Depression screen [facility] 2/9 09/07/2021 07/13/2019 04/13/2019  Decreased Interest 0 0 0  Down,  Depressed, Hopeless 0 0 0  PHQ - 2 Score 0 0 0  Altered sleeping 0 - 2  Tired, decreased energy 0 - 0  Change in appetite 0 - 0  Feeling bad or failure about yourself  0 - 0  Trouble concentrating 0 - 0  Moving slowly or fidgety/restless 0 - 0  Suicidal thoughts 0 - 0  PHQ-9 Score 0 - 2  Difficult doing work/chores Not difficult at all - Somewhat difficult  A/P: no evidence of recurrence- continue to monitor  Allergic rhinitis-takes Zyrtec and Singulair as well as Nasacort as needed- taking pretty regularly and maintains pretty good- discussed mood affects of singulair but she tolerates   IBS- tends toward constipation- thinks increasing fiber would help    Recommended follow up: Return in about 1 year (around 09/07/2022) for physical or sooner if needed.  Lab/Order associations: fasting   ICD-10-CM   1. Preventative health care  Z00.00     2. Hyperlipidemia, unspecified hyperlipidemia type  E78.5     3. Hyperglycemia  R73.9     4. Osteopenia of neck of left femur  M85.852      No orders of the defined types were placed in this encounter.   Return precautions advised.  Garret Reddish, MD

## 2021-09-07 NOTE — Patient Instructions (Addendum)
Recommend Tdap at pharmacy  Please stop by lab before you go If you have mychart- we will send your results within 3 business days of [place] receiving them.  If you do not have mychart- we will call you about results within 5 business days of [place] receiving them.  *please also note that you will see labs on mychart as soon as they post. I will later go in and write notes on them- will say "notes from Dr. Yong Channel"   We will call you within two weeks about your referral to sports medicine. If you do not hear within 2 weeks, give [place] a call.    Schedule your bone density test at check out desk.  - located [address] across the street from [place] - in the basement - you DO NEED an appointment for the bone density tests.   Recommended follow up: Return in about 1 year (around 09/07/2022) for physical or sooner if needed.

## 2021-09-10 ENCOUNTER — Ambulatory Visit (INDEPENDENT_AMBULATORY_CARE_PROVIDER_SITE_OTHER): Payer: Medicare HMO

## 2021-09-10 ENCOUNTER — Other Ambulatory Visit: Payer: Self-pay

## 2021-09-10 ENCOUNTER — Telehealth: Payer: Self-pay

## 2021-09-10 ENCOUNTER — Ambulatory Visit (INDEPENDENT_AMBULATORY_CARE_PROVIDER_SITE_OTHER): Payer: Medicare HMO | Admitting: Family Medicine

## 2021-09-10 VITALS — BP 130/82 | HR 79 | Ht 61.0 in | Wt 169.0 lb

## 2021-09-10 DIAGNOSIS — M79671 Pain in right foot: Secondary | ICD-10-CM

## 2021-09-10 DIAGNOSIS — M7741 Metatarsalgia, right foot: Secondary | ICD-10-CM

## 2021-09-10 DIAGNOSIS — M7061 Trochanteric bursitis, right hip: Secondary | ICD-10-CM | POA: Diagnosis not present

## 2021-09-10 DIAGNOSIS — J3089 Other allergic rhinitis: Secondary | ICD-10-CM | POA: Diagnosis not present

## 2021-09-10 MED ORDER — ATORVASTATIN CALCIUM 20 MG PO TABS: ORAL_TABLET | ORAL | 3 refills | Status: DC

## 2021-09-10 NOTE — Patient Instructions (Addendum)
Thank you for coming in today.   Please get an Xray today before you leave   Metatarsal pads  Please complete the exercises that the athletic trainer went over with you:  View at [email or web address] using code: [id number]  Recheck back in 1 month

## 2021-09-10 NOTE — Progress Notes (Signed)
° °  I, Peterson Lombard, LAT, ATC acting as a scribe for Nicole Leader, MD.  Nicole Dawson is a 71 y.o. female who presents to [place] at [facility] today for R foot pain. Pt was previously seen by Dr. Georgina Snell on 01/02/21 for L shoulder pain. Today, pt c/o R foot pain ongoing for awhile. Pt drives a Tesla and is having to use her foot more on the accelerator due to the one pedal driving nature of this vehicle.  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 Pt locates pain to the 4th-5th toes and MT.   She also notes some pain in the right lateral hip radiating down the lateral thigh.  Pain is worse with activity including walking and standing.  She has some pain laying on that right side bedtime.  R foot swelling: no Aggravates: walking, being on her feet Treatments tried: copper socks  Pertinent review of systems: No fevers or chills  Relevant historical information: IBS.  Tricuspid regurgitation.  Obesity.   Exam:  BP 130/82    Pulse 79    Ht 5\' 1"  (1.549 m)    Wt 169 lb (76.7 kg)    SpO2 95%    BMI 31.93 kg/m  General: Well Developed, well nourished, and in no acute distress.   MSK:  Right hip: Normal-appearing Normal hip motion. Tender palpation greater trochanter. Hip abduction strength is reduced 4/5.  External rotation strength is reduced 4/5.  Right foot: Normal-appearing Normal foot and ankle motion. Tender palpation plantar second and third metatarsal heads.     Lab and Radiology Results X-ray images right foot obtained today personally and independently interpreted. No acute fractures.  No severe degenerative changes. Small calcaneal heel spur. Await formal radiology review     Assessment and Plan: 71 y.o. female with right foot pain at the second and third metatarsal heads thought to be due to metatarsalgia.  Plan for metatarsal pads.  Additionally she has some lateral hip pain thought to be due to hip abductor tendinopathy  and greater trochanteric bursitis.  Plan to treat with home exercise program for trochanteric bursitis.  Recheck in about a month or 6 weeks.  Return sooner if needed.   PDMP not reviewed this encounter. Orders Placed This Encounter  Procedures   DG Foot Complete Right    Standing Status:   Future    Number of Occurrences:   1    Standing Expiration Date:   09/10/2022    Order Specific Question:   Reason for Exam (SYMPTOM  OR DIAGNOSIS REQUIRED)    Answer:   right foot pain    Order Specific Question:   Preferred imaging location?    Answer:   [facility]   No orders of the defined types were placed in this encounter.    Discussed warning signs or symptoms. Please see discharge instructions. Patient expresses understanding.   The above documentation has been reviewed and is accurate and complete Nicole Dawson, M.D.

## 2021-09-10 NOTE — Telephone Encounter (Signed)
Called and spoke with pt and labs reviewed. 

## 2021-09-10 NOTE — Telephone Encounter (Signed)
Patient calling back in regard to labs results.

## 2021-09-14 ENCOUNTER — Other Ambulatory Visit: Payer: TRICARE For Life (TFL)

## 2021-09-14 NOTE — Progress Notes (Signed)
Right foot x-ray shows no fractures.  No significant abnormalities around the area of pain.  A little bit of arthritis is present at the big toe joint.

## 2021-09-15 ENCOUNTER — Other Ambulatory Visit: Payer: Self-pay

## 2021-09-15 MED ORDER — ATORVASTATIN CALCIUM 20 MG PO TABS: ORAL_TABLET | ORAL | 3 refills | Status: DC

## 2021-10-08 NOTE — Progress Notes (Signed)
? ?I, Nicole Dawson, LAT, ATC, am serving as scribe for Dr. Lynne Leader. ? ?Nicole Dawson is a 71 y.o. female who presents to [place] at [facility] today for f/u of R foot metatarsalgia at the 2nd and 3rd MT and R lateral hip paindue to hip abductor tendinopathy and greater trochanteric bursitis. 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 She was last seen by Dr. Georgina Snell on 09/10/21 and was provided MT pads and shown a HEP focusing on hip aBd and glute strengthening.  Today, pt reports using nitro patches on her R foot, that were previously prescribed for her shoulder. Pt notes she has been wearing compression socks when she works which push over her 4th-5th toes. Pt reports R hip is about the same and she's been working on her HEP sometimes. ? ?She arrives today with cowboy boots.  She has not put her metatarsal pads in her sneakers/work shoes because she is not sure how to use them yet. ? ?Diagnostic testing: R foot XR- 09/10/21 ? ?Pertinent review of systems: No fevers or chills ? ?Relevant historical information: Breast cancer history. ? ? ?Exam:  ?BP 138/84   Pulse 71   Ht '5\' 1"'$  (1.549 m)   Wt 170 lb 9.6 oz (77.4 kg)   SpO2 98%   BMI 32.23 kg/m?  ?General: Well Developed, well nourished, and in no acute distress.  ? ?MSK: Right foot: Slight breakdown of transverse arch otherwise normal. ?Mildly tender palpation along plantar metatarsal heads.  ?Normal foot and ankle motion. ? ?Right hip: Normal motion. ? ? ?Lab and Radiology Results ? ? ?EXAM: ?RIGHT FOOT COMPLETE - 3+ VIEW ?  ?COMPARISON:  None. ?  ?FINDINGS: ?Slight hammertoe deformity of the fourth and fifth digits. Alignment ?is otherwise normal. Mild degenerative change of the first ?metatarsal phalangeal joint with joint space narrowing and spurring. ?The joint spaces are otherwise preserved. No other osteophytes. No ?erosion or periostitis. No fracture. Small plantar calcaneal spur ?and  Achilles tendon enthesophyte. Unremarkable soft tissues ?  ?IMPRESSION: ?1. Slight hammertoe deformity of the fourth and fifth digits. ?2. Mild degenerative change of the first metatarsophalangeal joint. ?  ?  ?Electronically Signed ?  By: Keith Rake M.D. ?  On: 09/12/2021 10:52 ?I, Lynne Leader, personally (independently) visualized and performed the interpretation of the images attached in this note. ? ? ? ?Assessment and Plan: ?71 y.o. female with right foot metatarsalgia.  She started using nitroglycerin patches on her own which seemed to help.  I think is reasonable to continue them.  Refilled nitroglycerin patches. ?However she really should be using metatarsal pads.  I cannot show her how to put the metatarsal pads in her shoes because she has cowboy a boots again today. ?She can either return to clinic at some point in the future with her work shoes/sneakers so I can put the metatarsal pads in those shoes or she could even go to [employer] where they can put them in her shoes or she can buy some insoles that have metatarsal pads built up. ? ?Additionally her greater trochanter pain on her right side has improved a bit but is still there some.  She does not think her pain is bad enough that an injection or formal PT is necessary.  Watchful waiting with continued home exercises for this. ? ?Recheck back as needed with the caveats as above. ? ? ?PDMP not reviewed this encounter. ?No orders  of the defined types were placed in this encounter. ? ?Meds ordered this encounter  ?Medications  ? nitroGLYCERIN (NITRO-DUR) 0.1 mg/hr patch  ?  Sig: 1/4 patch daily  ?  Dispense:  30 patch  ?  Refill:  12  ? ? ? ?Discussed warning signs or symptoms. Please see discharge instructions. Patient expresses understanding. ? ? ?The above documentation has been reviewed and is accurate and complete Lynne Leader, M.D. ? ? ?Total encounter time 20 minutes including face-to-face time with the patient and, reviewing past medical  record, and charting on the date of service.   ?Treatment plan and options as above. ? ? ?

## 2021-10-09 ENCOUNTER — Other Ambulatory Visit: Payer: Self-pay

## 2021-10-09 ENCOUNTER — Ambulatory Visit (INDEPENDENT_AMBULATORY_CARE_PROVIDER_SITE_OTHER): Payer: Medicare HMO | Admitting: Family Medicine

## 2021-10-09 VITALS — BP 138/84 | HR 71 | Ht 61.0 in | Wt 170.6 lb

## 2021-10-09 DIAGNOSIS — M7741 Metatarsalgia, right foot: Secondary | ICD-10-CM

## 2021-10-09 DIAGNOSIS — M7061 Trochanteric bursitis, right hip: Secondary | ICD-10-CM

## 2021-10-09 MED ORDER — NITROGLYCERIN 0.1 MG/HR TD PT24: MEDICATED_PATCH | TRANSDERMAL | 12 refills | Status: AC

## 2021-10-09 NOTE — Patient Instructions (Addendum)
Thank you for coming in today.  ? ?Please use the metatarsal pad.  ? ?I can place them in your regular shoes but I need you and your shoes here.  ? ?[employer] can as well.  ? ?You can buy insoles with the metatarsal pads built in.  ?  ?Work on the home exercises.  ? ?Recheck as needed. I can do an injection or send you to PT if your hip is bad enough.  ?

## 2021-11-30 ENCOUNTER — Encounter: Payer: Medicare HMO | Admitting: Family Medicine

## 2021-12-16 ENCOUNTER — Encounter: Payer: Self-pay | Admitting: Physician Assistant

## 2021-12-16 ENCOUNTER — Ambulatory Visit (INDEPENDENT_AMBULATORY_CARE_PROVIDER_SITE_OTHER): Payer: Medicare HMO | Admitting: Physician Assistant

## 2021-12-16 ENCOUNTER — Other Ambulatory Visit: Payer: Self-pay

## 2021-12-16 VITALS — BP 127/76 | HR 76 | Temp 98.4°F | Ht 61.0 in | Wt 167.0 lb

## 2021-12-16 DIAGNOSIS — J029 Acute pharyngitis, unspecified: Secondary | ICD-10-CM

## 2021-12-16 LAB — POCT RAPID STREP A (OFFICE): Rapid Strep A Screen: NEGATIVE

## 2021-12-16 NOTE — Progress Notes (Signed)
Nicole Dawson is a 71 y.o. female here for a new problem. ? ?History of Present Illness:  ? ?Chief Complaint  ?Patient presents with  ? Sore Throat  ?  Pt c/o sore throat and sinus drainage;   ? ? ?HPI ? ?Sore throat ?Patient here with sore throat that has been onset since yesterday. She also has been experiencing some sinus drainage. She notes she usually take Zyrtec but she was out of her medication x 1 day. 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 No other specific treatment tried. States ear has been itching for a while. Denies fever or chills. No reported ear pain.. She has not checked for Covid at home. No worsening sinus pressure. No nasal congestion.  ? ? ?Past Medical History:  ?Diagnosis Date  ? Allergy   ? Breast cancer ([place])   ? Depression   ? Diverticulitis   ? Diverticulosis   ? Genital herpes   ? Heart murmur   ? HLD (hyperlipidemia)   ? Hx of tobacco use, presenting hazards to health 07/08/2015  ? 22 years at 1 PPD  ? IBS (irritable bowel syndrome) 07/08/2015  ? Diagnosis by Dr. Conley Canal her prior PCP  ? PONV (postoperative nausea and vomiting)   ? Sigmoid diverticulitis 07/08/2015  ? ?  ?Social History  ? ?Tobacco Use  ? Smoking status: Former  ?  Packs/day: 1.00  ?  Years: 30.00  ?  Pack years: 30.00  ?  Types: Cigarettes  ?  Quit date: 07/07/1992  ?  Years since quitting: 29.4  ? Smokeless tobacco: Never  ?Substance Use Topics  ? Alcohol use: No  ? Drug use: No  ? ? ?Past Surgical History:  ?Procedure Laterality Date  ? APPENDECTOMY  1962  ? BILATERAL TOTAL MASTECTOMY WITH AXILLARY LYMPH NODE DISSECTION  2005  ? bilateral mastectomy  ? BREAST RECONSTRUCTION  2005  ? TUBAL LIGATION  1984  ? ? ?Family History  ?Problem Relation Age of Onset  ? Hyperlipidemia Mother   ? Hypertension Mother   ? COPD Mother   ? Stroke Mother   ?     in 61s.   ? Heart attack Father   ?     43.   ? Tuberculosis Father   ?     1/2 of lung removed.   ? Hyperlipidemia Father   ? Lymphoma  Maternal Aunt   ? Skin cancer Paternal Grandmother   ? ? ?No Known Allergies ? ?Current Medications:  ? ?Current Outpatient Medications:  ?  aspirin EC 81 MG tablet, Take 81 mg by mouth at bedtime. , Disp: , Rfl:  ?  atorvastatin (LIPITOR) 20 MG tablet, TAKE 1 TABLET twice A WEEK, Disp: 26 tablet, Rfl: 3 ?  Biotin 10000 MCG TABS, Take 1 tablet by mouth daily., Disp: , Rfl:  ?  Carboxymethylcellul-Glycerin (LUBRICATING EYE DROPS OP), Apply 1 drop to eye as needed (dry eyes). , Disp: , Rfl:  ?  cetirizine (ZYRTEC) 10 MG tablet, Take 10 mg by mouth daily as needed for allergies., Disp: , Rfl:  ?  cholecalciferol (VITAMIN D) 1000 units tablet, Take 1,000 Units by mouth 2 (two) times daily at 8 am and 10 pm., Disp: , Rfl:  ?  cyanocobalamin 1000 MCG tablet, Take 1,000 mcg by mouth daily., Disp: , Rfl:  ?  gabapentin (NEURONTIN) 100 MG capsule, Take 1 capsule (100 mg total) by mouth 3 (three) times daily., Disp: 270  capsule, Rfl: 3 ?  ketotifen (ZADITOR) 0.025 % ophthalmic solution, Place 1 drop into both eyes daily., Disp: , Rfl:  ?  mirabegron ER (MYRBETRIQ) 25 MG TB24 tablet, Take 1 tablet (25 mg total) by mouth daily., Disp: 90 tablet, Rfl: 3 ?  montelukast (SINGULAIR) 10 MG tablet, Take 10 mg by mouth at bedtime., Disp: , Rfl:  ?  Multiple Vitamin (MULTIVITAMIN) tablet, Take 1 tablet by mouth daily., Disp: , Rfl:  ?  nitroGLYCERIN (NITRO-DUR) 0.1 mg/hr patch, 1/4 patch daily, Disp: 30 patch, Rfl: 12 ?  OVER THE COUNTER MEDICATION, Caltrate, Disp: , Rfl:  ?  triamcinolone (NASACORT AQ) 55 MCG/ACT AERO nasal inhaler, Place 2 sprays into the nose daily., Disp: 1 Inhaler, Rfl: 12 ?  valACYclovir (VALTREX) 500 MG tablet, Take 500 mg by mouth at bedtime. , Disp: , Rfl:   ? ?Review of Systems:  ? ?ROS ?Negative unless otherwise specified per HPI.  ? ?Vitals:  ? ?Vitals:  ? 12/16/21 1538  ?BP: 127/76  ?Pulse: 76  ?Temp: 98.4 ?F (36.9 ?C)  ?TempSrc: Temporal  ?SpO2: 95%  ?Weight: 167 lb (75.8 kg)  ?Height: '5\' 1"'$  (1.549 m)  ?    ?Body mass index is 31.55 kg/m?. ? ?Physical Exam:  ? ?Physical Exam ?Vitals and nursing note reviewed.  ?Constitutional:   ?   General: She is not in acute distress. ?   Appearance: She is well-developed. She is not ill-appearing or toxic-appearing.  ?HENT:  ?   Head: Normocephalic and atraumatic.  ?   Right Ear: Tympanic membrane, ear canal and external ear normal. Tympanic membrane is not erythematous, retracted or bulging.  ?   Left Ear: Tympanic membrane, ear canal and external ear normal. Tympanic membrane is not erythematous, retracted or bulging.  ?   Nose: Nose normal.  ?   Right Sinus: No maxillary sinus tenderness or frontal sinus tenderness.  ?   Left Sinus: No maxillary sinus tenderness or frontal sinus tenderness.  ?   Mouth/Throat:  ?   Pharynx: Uvula midline. Posterior oropharyngeal erythema present.  ?Eyes:  ?   General: Lids are normal.  ?   Conjunctiva/sclera: Conjunctivae normal.  ?Neck:  ?   Trachea: Trachea normal.  ?Cardiovascular:  ?   Rate and Rhythm: Normal rate and regular rhythm.  ?   Heart sounds: Normal heart sounds, S1 normal and S2 normal.  ?Pulmonary:  ?   Effort: Pulmonary effort is normal.  ?   Breath sounds: Normal breath sounds. No decreased breath sounds, wheezing, rhonchi or rales.  ?Lymphadenopathy:  ?   Cervical: No cervical adenopathy.  ?Skin: ?   General: Skin is warm and dry.  ?Neurological:  ?   Mental Status: She is alert.  ?Psychiatric:     ?   Speech: Speech normal.     ?   Behavior: Behavior normal. Behavior is cooperative.  ? ?Results for orders placed or performed in visit on 12/16/21  ?POCT rapid strep A  ?Result Value Ref Range  ? Rapid Strep A Screen Negative Negative  ? ? ?Assessment and Plan:  ? ?Pharyngitis, unspecified etiology ?No red flags ?Strep test negative ?Suspect viral URI ?Recommend COVID test but she refuses ?Continue to monitor symptoms and wash hands ?NSAIDs prn for sore throat discussed ?Does not meet criteria for abx at this time ?Follow-up if  new/worsening sx ? ?I,Savera Zaman,acting as a Education administrator for [employer], PA.,have documented all relevant documentation on the behalf of Inda Coke, PA,as directed by  Inda Coke, PA while in the presence of [place], [place].  ? ?IInda Coke, PA, have reviewed all documentation for this visit. The documentation on 12/16/21 for the exam, diagnosis, procedures, and orders are all accurate and complete. ? ? ?Inda Coke, PA-C ? ?

## 2021-12-16 NOTE — Patient Instructions (Signed)
It was great to see you! ? ?You have a viral upper respiratory infection. ?Antibiotics are not needed for this.  Viral infections usually take 7-10 days to resolve.  The cough can last a few weeks to go away. ? ?Continue zyrtec and nasal sprays. ? ?Push fluids and get plenty of rest. ?Please return if you are not improving as expected, or if you have high fevers (>101.5) or difficulty swallowing or worsening productive cough. ? ?Call clinic with questions. ? ?I hope you start feeling better soon!  ?

## 2021-12-21 ENCOUNTER — Telehealth: Payer: Self-pay | Admitting: Family Medicine

## 2021-12-21 MED ORDER — HYDROCOD POLI-CHLORPHE POLI ER 10-8 MG/5ML PO SUER: 5.0000 mL | Freq: Two times a day (BID) | ORAL | 0 refills | Status: DC | PRN

## 2021-12-21 NOTE — Telephone Encounter (Signed)
See message. Pharmacy updated.

## 2021-12-21 NOTE — Telephone Encounter (Signed)
Patient Name: Nicole Dawson Gender: Female DOB: 03-10-1951 Age: 71 Y 11 M 16 D Return Phone Number: [phone] (Primary), [phone] (Alternate) Address: City/ State/ Zip: [place]   [address] Client [place] [facility] [place] Client Site [place] [facility] [place] Night Provider Garret Reddish- MD Contact Type Call Who Is Calling Patient / Member / Family / Caregiver Call Type Triage / Clinical Relationship To Patient Self Return Phone Number [phone] (Primary) Chief Complaint Cough Reason for Call Symptomatic / Request for Health Information Initial Comment Caller states she seen the PA on Wednesday. She has a cough. Hx of sinus issues. She is asking of cough syrup. Translation No Nurse Assessment Nurse: Alyson Ingles, RN, Wells Guiles Date/Time (Eastern Time): 12/19/2021 9:02:58 PM Confirm and document reason for call. If symptomatic, describe symptoms. ---Caller states that she saw a PA Daleen Bo. on Wednesday. She has a cough. She has a history of sinus issues. Her symptoms began on Tuesday with a sore throat, after a few days, she starts having more runny nose and is coughing a lot. She usually takes Delsium cough medicine, her provider has prescribed cough medicine with Codeine in the past, she's requesting it to be prescribed. She also has seasonal allergy. Does the patient have any new or worsening symptoms? ---Yes Will a triage be completed? ---Yes Related visit to physician within the last 2 weeks? ---No Does the PT have any chronic conditions? (i.e. diabetes, asthma, this includes High risk factors for pregnancy, etc.) ---Yes List chronic conditions. ---seasonal allergies, sinus issues Is this a behavioral health or substance abuse call? ---No Nurse: Alyson Ingles, RN, Wells Guiles Date/Time (Eastern Time): 12/19/2021 9:17:15 PM Please select the assessment type ---Verbal order / New medication order Nurse  Assessment Additional Documentation ---Caller is requesting a prescription for cough medicine with Codeine, states her provider has ordered it before for her Does the client directives allow for assistance with medications after hours? ---No Guidelines Guideline Title Affirmed Question Affirmed Notes Nurse Date/Time Eilene Ghazi Time) Cough - Acute NonProductive [1] Continuous (nonstop) coughing interferes with work or school AND [2] no improvement using cough treatment per Care Advice Alyson Ingles, RN, Wells Guiles 12/19/2021 9:09:09 PM Disp. Time Eilene Ghazi Time) Disposition Final User 12/19/2021 9:16:58 PM See PCP within 24 Hours Yes Alyson Ingles, RN, Romualdo Bolk Disagree/Comply Comply Caller Understands Yes PreDisposition Did not know what to do Care Advice Given Per Guideline SEE PCP WITHIN 24 HOURS: * IF OFFICE WILL BE CLOSED: You need to be seen within the next 24 hours. A clinic or an urgent care center is often a good source of care if your doctor's office is closed or you can't get an appointment. COUGH MEDICINES: * COUGH DROPS: Over-the-counter cough drops can help a lot, especially for mild coughs. They soothe an irritated throat and remove the tickle sensation in the back of the throat. Cough drops are easy to carry with you. * COUGH SYRUP WITH DEXTROMETHORPHAN: An over-the-counter cough syrup can help your cough. The most common cough suppressant in overthe-counter cough medicines is dextromethorphan. * HOME REMEDY - HARD CANDY: Hard candy works just as well as overthe-counter cough drops. People who have diabetes should use sugar-free candy. * HOME REMEDY - HONEY: This old home remedy has been shown to help decrease coughing at night. The adult dosage is 2 teaspoons (10 ml) at bedtime. HUMIDIFIER: * If the air is dry, use a humidifier in the bedroom. * Dry air makes coughs worse. COUGHING SPELLS: * Drink warm  fluids. Inhale warm mist. This can help relax the airway and also loosen up  phlegm. * Suck on cough drops or hard candy to coat the irritated throat. CALL BACK IF: * Difficulty breathing occurs * You become worse CARE ADVICE given per Cough - Acute NonProductive (Adult) guideline. Referrals REFERRED TO PCP OFFICE

## 2021-12-21 NOTE — Telephone Encounter (Signed)
Tried to contact pt, voicemail is full unable to leave message.

## 2021-12-21 NOTE — Addendum Note (Signed)
Addended by: Erlene Quan on: 12/21/2021 08:57 PM   Modules accepted: Orders

## 2021-12-21 NOTE — Telephone Encounter (Signed)
Pt saw you on 05/17, would like cough syrup now.

## 2021-12-21 NOTE — Telephone Encounter (Signed)
FO Rep asked question regarding if cough is still present. Pt states the cough is present and disrupting sleep. Plans to take at night for sleeping.  Pt request script to be sent to  [facility] at  [address]  [id number]  [place], [place] [address] [address]: [address]

## 2021-12-22 NOTE — Telephone Encounter (Signed)
Left detailed message on personal voicemail, Samantha sent Tussionex to the pharmacy she wanted to  make sure that you understand that this is sedating and to take the least amount needed. Any questions please call office.

## 2021-12-31 ENCOUNTER — Telehealth: Payer: Self-pay | Admitting: Family Medicine

## 2021-12-31 NOTE — Telephone Encounter (Signed)
Copied from [place] [id number]. Topic: Medicare AWV >> Dec 31, 2021 10:07 AM Harris-Coley, Hannah Beat wrote: Reason for CRM: Left message for patient to schedule Annual Wellness Visit.  Please schedule with Nurse Health Advisor Charlott Rakes, RN at [facility].  Please call [phone] ask for [facility]

## 2022-01-11 DIAGNOSIS — H2513 Age-related nuclear cataract, bilateral: Secondary | ICD-10-CM | POA: Diagnosis not present

## 2022-03-24 ENCOUNTER — Telehealth: Payer: Self-pay | Admitting: Family Medicine

## 2022-03-24 NOTE — Telephone Encounter (Signed)
Routing to PCP and SW, PA-C at [facility]  Patient Name: Nicole Dawson Gender: Female DOB: 1950-12-04 Age: 71 Y 2 M 18 D Return Phone Number: [phone] (Primary), [phone] (Secondary) Address: City/ State/ Zip: [place]  [address] Client [place] [facility] [place] Client Site [place] at [place] Day Provider Garret Reddish- MD Contact Type Call Who Is Calling Patient / Member / Family / Caregiver Call Type Triage / Clinical Relationship To Patient Self Return Phone Number [phone] (Primary) Chief Complaint Groin pain Reason for Call Symptomatic / Request for [place] states she is experiencing right side growing pain that wrapped around to her buttox. Translation No Nurse Assessment Nurse: [place], RN, Estill Bamberg Date/Time (Eastern Time): 03/24/2022 11:16:14 AM Confirm and document reason for call. If symptomatic, describe symptoms. ---caller states she is hurting in the RLQ wrapping around to the buttock. more at the groin Does the patient have any new or worsening symptoms? ---Yes Will a triage be completed? ---Yes Related visit to physician within the last 2 weeks? ---No Does the PT have any chronic conditions? (i.e. diabetes, asthma, this includes High risk factors for pregnancy, etc.) ---Yes List chronic conditions. ---restless legs on gabapentin, on atorvastatin Is this a behavioral health or substance abuse call? ---No Guidelines Guideline Title Affirmed Question Affirmed Notes Nurse Date/Time (Eastern Time) Groin Injury and Strain [1] After 3 days AND [2] pain not improved [place], RN, Estill Bamberg 03/24/2022 11:21:11 AM Disp. Time Eilene Ghazi Time) Disposition Final User 03/24/2022 11:25:20 AM SEE PCP WITHIN 3 DAYS Yes Hum[place]et, RN, Estill Bamberg Final Disposition 03/24/2022 11:25:20 AM SEE PCP WITHIN 3 DAYS Yes [place], RN, Shelly Coss Disagree/Comply Comply Caller  Understands Yes PreDisposition InappropriateToAsk Care Advice Given Per Guideline SEE PCP WITHIN 3 DAYS: * You need to be seen within 2 or 3 days. * PCP VISIT: Call your doctor (or NP/PA) during regular office hours and make an appointment. A clinic or urgent care center are good places to go for care if your doctor's office is closed or you can't get an appointment. NOTE: If office will be open tomorrow, tell caller to call then, not in 3 days. CARE ADVICE given per Groin Injury and Strain (Adult) guideline. CALL BACK IF: * Pain becomes severe * You become worse * Use a heat pack, heating pad, or warm wet washcloth. * Do this for 10 minutes three times a day. 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 denies urinary symptoms at this time. no fever. pain is mild and controlled with tylenol or ibuprofen, able to sleep/function. no vomiting, no bulging or pulsating in the groin. there is no pain or numbness shooting down the leg. previous appendectomy Referrals REFERRED TO PCP OFFICE

## 2022-03-24 NOTE — Telephone Encounter (Signed)
FYI, see Triage note. Pt is scheduled to see you tomorrow.

## 2022-03-24 NOTE — Telephone Encounter (Signed)
Pt is scheduled to see Sam tomorrow.

## 2022-03-24 NOTE — Telephone Encounter (Signed)
Patient states: - She has been having right sided groin pain that has wrapped around to her buttocks - Believed she pulled something until she lied on stomach and felt discomfort - Believes it could be her diverticulitis but isn't sure    Pt has been sent to triage.

## 2022-03-25 ENCOUNTER — Encounter: Payer: Self-pay | Admitting: Physician Assistant

## 2022-03-25 ENCOUNTER — Ambulatory Visit (INDEPENDENT_AMBULATORY_CARE_PROVIDER_SITE_OTHER): Payer: Medicare HMO | Admitting: Physician Assistant

## 2022-03-25 VITALS — BP 120/70 | HR 81 | Temp 98.1°F | Ht 61.0 in | Wt 170.4 lb

## 2022-03-25 DIAGNOSIS — R1031 Right lower quadrant pain: Secondary | ICD-10-CM

## 2022-03-25 NOTE — Progress Notes (Signed)
Nicole Dawson is a 71 y.o. female here for a follow up of a pre-existing problem.  History of Present Illness:   Chief Complaint  Patient presents with   Groin Pain    Pt c/o  Right groin pain radiating to buttock area, started 2 weeks ago now resolved. Slight pain right buttock    HPI  R groin pain Started two weeks ago.  Patient reports that she had some groin pain in her right groin that started without any known event.  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  She denies any specific motion in or lifting that could have caused this.  She denies: Rash, changes in bowels, gas, urinary symptoms, history of this pain before.  Yesterday the pain changed and was in her right buttock.  The pain was not intense.  She reports that this pain was only when she was walking.  As of today all of the pain has resolved.  She has been treating this with Tylenol and ibuprofen.  Past Medical History:  Diagnosis Date   Allergy    Breast cancer ([place])    Depression    Diverticulitis    Diverticulosis    Genital herpes    Heart murmur    HLD (hyperlipidemia)    Hx of tobacco use, presenting hazards to health 07/08/2015   22 years at 1 PPD   IBS (irritable bowel syndrome) 07/08/2015   Diagnosis by Dr. Conley Canal her prior PCP   PONV (postoperative nausea and vomiting)    Sigmoid diverticulitis 07/08/2015     Social History   Tobacco Use   Smoking status: Former    Packs/day: 1.00    Years: 30.00    Total pack years: 30.00    Types: Cigarettes    Quit date: 07/07/1992    Years since quitting: 29.7   Smokeless tobacco: Never  Substance Use Topics   Alcohol use: No   Drug use: No    Past Surgical History:  Procedure Laterality Date   APPENDECTOMY  1962   BILATERAL TOTAL MASTECTOMY WITH AXILLARY LYMPH NODE DISSECTION  2005   bilateral mastectomy   BREAST RECONSTRUCTION  2005   TUBAL LIGATION  1984    Family History  Problem Relation Age of Onset    Hyperlipidemia Mother    Hypertension Mother    COPD Mother    Stroke Mother        in 4s.    Heart attack Father        88.    Tuberculosis Father        1/2 of lung removed.    Hyperlipidemia Father    Lymphoma Maternal Aunt    Skin cancer Paternal Grandmother     Allergies  Allergen Reactions   Tape Rash    Current Medications:   Current Outpatient Medications:    aspirin EC 81 MG tablet, Take 81 mg by mouth at bedtime. , Disp: , Rfl:    atorvastatin (LIPITOR) 20 MG tablet, TAKE 1 TABLET twice A WEEK, Disp: 26 tablet, Rfl: 3   Bioflavonoid Products (ESTER C PO), Take 1 tablet by mouth 2 (two) times daily., Disp: , Rfl:    Biotin 10000 MCG TABS, Take 1 tablet by mouth daily., Disp: , Rfl:    Carboxymethylcellul-Glycerin (LUBRICATING EYE DROPS OP), Apply 1 drop to eye as needed (dry eyes). , Disp: , Rfl:    cetirizine (ZYRTEC) 10 MG tablet, Take 10 mg by  mouth daily as needed for allergies., Disp: , Rfl:    cholecalciferol (VITAMIN D) 1000 units tablet, Take 1,000 Units by mouth 2 (two) times daily at 8 am and 10 pm., Disp: , Rfl:    Coenzyme Q10 (COQ-10 PO), Take 1 capsule by mouth daily in the afternoon., Disp: , Rfl:    cyanocobalamin 1000 MCG tablet, Take 1,000 mcg by mouth daily., Disp: , Rfl:    gabapentin (NEURONTIN) 100 MG capsule, Take 1 capsule (100 mg total) by mouth 3 (three) times daily., Disp: 270 capsule, Rfl: 3   ketotifen (ZADITOR) 0.025 % ophthalmic solution, Place 1 drop into both eyes daily., Disp: , Rfl:    mirabegron ER (MYRBETRIQ) 25 MG TB24 tablet, Take 1 tablet (25 mg total) by mouth daily., Disp: 90 tablet, Rfl: 3   montelukast (SINGULAIR) 10 MG tablet, Take 10 mg by mouth at bedtime., Disp: , Rfl:    Multiple Vitamin (MULTIVITAMIN) tablet, Take 1 tablet by mouth daily., Disp: , Rfl:    nitroGLYCERIN (NITRO-DUR) 0.1 mg/hr patch, 1/4 patch daily, Disp: 30 patch, Rfl: 12   OVER THE COUNTER MEDICATION, Caltrate, Disp: , Rfl:    triamcinolone (NASACORT  AQ) 55 MCG/ACT AERO nasal inhaler, Place 2 sprays into the nose daily., Disp: 1 Inhaler, Rfl: 12   valACYclovir (VALTREX) 500 MG tablet, Take 500 mg by mouth at bedtime. , Disp: , Rfl:    Review of Systems:   ROS Negative unless otherwise specified per HPI.   Vitals:   Vitals:   03/25/22 1104  BP: 120/70  Pulse: 81  Temp: 98.1 F (36.7 C)  TempSrc: Temporal  SpO2: 96%  Weight: 170 lb 6.1 oz (77.3 kg)  Height: '5\' 1"'$  (1.549 m)     Body mass index is 32.19 kg/m.  Physical Exam:   Physical Exam Vitals and nursing note reviewed.  Constitutional:      General: She is not in acute distress.    Appearance: She is well-developed. She is not ill-appearing or toxic-appearing.  HENT:     Head: Normocephalic and atraumatic.     Right Ear: Tympanic membrane, ear canal and external ear normal. Tympanic membrane is not erythematous, retracted or bulging.     Left Ear: Tympanic membrane, ear canal and external ear normal. Tympanic membrane is not erythematous, retracted or bulging.     Nose: Nose normal.     Right Sinus: No maxillary sinus tenderness or frontal sinus tenderness.     Left Sinus: No maxillary sinus tenderness or frontal sinus tenderness.     Mouth/Throat:     Pharynx: Uvula midline. No posterior oropharyngeal erythema.  Eyes:     General: Lids are normal.     Conjunctiva/sclera: Conjunctivae normal.  Neck:     Trachea: Trachea normal.  Cardiovascular:     Rate and Rhythm: Normal rate and regular rhythm.     Heart sounds: Normal heart sounds, S1 normal and S2 normal.  Pulmonary:     Effort: Pulmonary effort is normal.     Breath sounds: Normal breath sounds. No decreased breath sounds, wheezing, rhonchi or rales.  Musculoskeletal:     Comments: No tenderness to palpation of right groin, no bulge palpated Normal range of motion of right hip No tenderness to palpation of right SI joint or lumbar Normal gait  Lymphadenopathy:     Cervical: No cervical adenopathy.   Skin:    General: Skin is warm and dry.  Neurological:     Mental Status: She is  alert.  Psychiatric:        Speech: Speech normal.        Behavior: Behavior normal. Behavior is cooperative.     Assessment and Plan:   Right groin pain No red flags To have resolved That she had a right groin strain If this returns, recommend we get her in with sports medicine for further evaluation  Inda Coke, PA-C

## 2022-03-25 NOTE — Patient Instructions (Signed)
It was great to see you!  Review hand out  If symptoms return, let us know!  Take care,  Inda Coke PA-C

## 2022-05-31 DIAGNOSIS — Z9889 Other specified postprocedural states: Secondary | ICD-10-CM | POA: Diagnosis not present

## 2022-06-02 ENCOUNTER — Other Ambulatory Visit: Payer: Self-pay | Admitting: Family Medicine

## 2022-06-03 ENCOUNTER — Telehealth: Payer: Self-pay | Admitting: Family Medicine

## 2022-06-03 ENCOUNTER — Encounter: Payer: Self-pay | Admitting: Family Medicine

## 2022-06-03 ENCOUNTER — Ambulatory Visit (INDEPENDENT_AMBULATORY_CARE_PROVIDER_SITE_OTHER): Payer: Medicare HMO | Admitting: Family Medicine

## 2022-06-03 ENCOUNTER — Ambulatory Visit: Payer: Medicare HMO

## 2022-06-03 VITALS — BP 102/70 | HR 77 | Temp 97.7°F | Ht 61.0 in | Wt 169.0 lb

## 2022-06-03 DIAGNOSIS — R7303 Prediabetes: Secondary | ICD-10-CM | POA: Diagnosis not present

## 2022-06-03 DIAGNOSIS — K589 Irritable bowel syndrome without diarrhea: Secondary | ICD-10-CM

## 2022-06-03 DIAGNOSIS — Z23 Encounter for immunization: Secondary | ICD-10-CM

## 2022-06-03 DIAGNOSIS — R109 Unspecified abdominal pain: Secondary | ICD-10-CM

## 2022-06-03 DIAGNOSIS — R140 Abdominal distension (gaseous): Secondary | ICD-10-CM

## 2022-06-03 NOTE — Telephone Encounter (Signed)
Pt states: -received 2023 flu shot at appointment on 06/03/22 -Proof of vaccine administration is required of her employer and she forgot to ask for it at 06/03/22 appointment.   Pt requests: -Copy of vaccine record to be sent to patient via [facility].   Please follow up with patient as needed.

## 2022-06-03 NOTE — Addendum Note (Signed)
Addended by: Marin Olp on: 06/03/2022 09:33 AM   Modules accepted: Level of Service

## 2022-06-03 NOTE — Progress Notes (Signed)
Phone [phone] In person visit   Subjective:   Nicole Dawson is a 71 y.o. year old very pleasant female patient who presents for/with See problem oriented charting Chief Complaint  Patient presents with   bowel issues    Pt c/o lower bowel issues that started after eating something spicy at a [place]. She also states when she gets stressed she gets gassy.    Past Medical History-  Patient Active Problem List   Diagnosis Date Noted   IBS (irritable bowel syndrome) 07/08/2015    Priority: Medium    Restless leg syndrome 07/05/2014    Priority: Medium    Hyperlipidemia 05/28/2009    Priority: Medium    BREAST CANCER, HX OF 03/11/2009    Priority: Medium    Obesity (BMI 30.0-34.9) 12/08/2017    Priority: Low   Greater trochanteric bursitis of left hip 05/10/2016    Priority: Low   Sigmoid diverticulitis 07/08/2015    Priority: Low   Hx of tobacco use, presenting hazards to health 07/08/2015    Priority: Low   Wheezing 05/30/2013    Priority: Low   Low back pain 08/03/2012    Priority: Low   TRICUSPID REGURGITATION 05/28/2009    Priority: Low   DEPRESSION 03/11/2009    Priority: Low   Allergic rhinitis 03/11/2009    Priority: [place], HX OF 03/11/2009    Priority: Low   History of herpes genitalis 03/11/2009    Priority: Low   Right rotator cuff tear 10/18/2019   Arthritis of right acromioclavicular joint 09/05/2019   Hyperglycemia 04/13/2019   Subacromial bursitis of right shoulder joint 04/10/2019   Osteopenia 01/19/2018    Medications- reviewed and updated Current Outpatient Medications  Medication Sig Dispense Refill   aspirin EC 81 MG tablet Take 81 mg by mouth at bedtime.      atorvastatin (LIPITOR) 20 MG tablet TAKE 1 TABLET twice A WEEK 26 tablet 3   Bioflavonoid Products (ESTER C PO) Take 1 tablet by mouth 2 (two) times daily.     Biotin 10000 MCG TABS Take 1 tablet by mouth daily.     Carboxymethylcellul-Glycerin  (LUBRICATING EYE DROPS OP) Apply 1 drop to eye as needed (dry eyes).      cetirizine (ZYRTEC) 10 MG tablet Take 10 mg by mouth daily as needed for allergies.     cholecalciferol (VITAMIN D) 1000 units tablet Take 1,000 Units by mouth 2 (two) times daily at 8 am and 10 pm.     Coenzyme Q10 (COQ-10 PO) Take 1 capsule by mouth daily in the afternoon.     cyanocobalamin 1000 MCG tablet Take 1,000 mcg by mouth daily.     gabapentin (NEURONTIN) 100 MG capsule TAKE 1 CAPSULE THREE TIMES A DAY 270 capsule 3   ketotifen (ZADITOR) 0.025 % ophthalmic solution Place 1 drop into both eyes daily.     mirabegron ER (MYRBETRIQ) 25 MG TB24 tablet Take 1 tablet (25 mg total) by mouth daily. 90 tablet 3   montelukast (SINGULAIR) 10 MG tablet Take 10 mg by mouth at bedtime.     Multiple Vitamin (MULTIVITAMIN) tablet Take 1 tablet by mouth daily.     nitroGLYCERIN (NITRO-DUR) 0.1 mg/hr patch 1/4 patch daily 30 patch 12   OVER THE COUNTER MEDICATION Caltrate     triamcinolone (NASACORT AQ) 55 MCG/ACT AERO nasal inhaler Place 2 sprays into the nose daily. 1 Inhaler 12   valACYclovir (VALTREX) 500 MG tablet Take 500 mg by  mouth at bedtime.      No current facility-administered medications for this visit.     Objective:  BP 102/70   Pulse 77   Temp 97.7 F (36.5 C)   Ht '5\' 1"'$  (1.549 m)   Wt 169 lb (76.7 kg)   SpO2 98%   BMI 31.93 kg/m  Gen: NAD, resting comfortably CV: RRR no murmurs rubs or gallops Lungs: CTAB no crackles, wheeze, rhonchi Abdomen: soft/nontender/nondistended/normal bowel sounds. No rebound or guarding.  Ext: trace edema- usually wears compression Skin: warm, dry    Assessment and Plan   #GI concerns-with baseline IBS-like symptoms but tends toward constipation S: Patient states that she started to get some stomach upset after eating something particularly spicy (outside of her norm) or [place] about 2 weeks ago- cramping to level that had to leave work- improved when  finally had bowel movement- large BM- no blood or melena- has started to feel better since then- had 6 days off planned anyway and that helped.  She is also noted with stress tends to get gas here. -each morning does have hot chocolate with benefiber and juice- a lot of sugar could contribute - Treatments tried: tries to avoid beans  A/P: baseline IBS issues- flared by particularly spicy food now improving. As now better hold off on labs - abdominal exam benign -separate issue of gassiness- is planning to cut down on sugar intake and also can trial gas-x -could try probiotic short term- but had more gas in past on align so wants to focus on diet changes first   #hyperlipidemia S: Medication:Atorvastatin 20 mg twice weekly up from weekly after last visit-targeting LDL at least under 100 but also has prediabetes so we are being cautious Lab Results  Component Value Date   CHOL 201 (H) 09/07/2021   HDL 50.90 09/07/2021   LDLCALC 112 (H) 09/07/2021   LDLDIRECT 94.6 07/05/2014   TRIG 191.0 (H) 09/07/2021   CHOLHDL 4 09/07/2021  A/P: hopefully improving- recheck at physical- for now continue current medicines  # Hyperglycemia/insulin resistance/prediabetes-peak A1c 5.7 in 2016 S:  Medication: none Exercise and diet- within 1 lb of last October- still would like to get back down to 150s from 2020s- not exercising but does stay active with work Lab Results  Component Value Date   HGBA1C 5.5 09/07/2021   HGBA1C 5.6 04/13/2019   HGBA1C 5.7 (H) 07/09/2015   A/P: hopefully improving- recheck at physical  Recommended follow up: Return for next already scheduled visit or sooner if needed. Future Appointments  Date Time Provider [place]  09/09/2022  8:20 AM Marin Olp, MD [facility]   Lab/Order associations:   ICD-10-CM   1. Irritable bowel syndrome, unspecified type  K58.9     2. Abdominal cramping  R10.9     3. Gassiness  R14.0     4. Need for immunization against  influenza  Z23 Flu Vaccine QUAD High Dose(Fluad)    5. Prediabetes  R73.03      Return precautions advised.  Garret Reddish, MD

## 2022-06-03 NOTE — Patient Instructions (Addendum)
Health Maintenance Due  Topic Date Due   Medicare Annual Wellness (AWV)  07/12/2020  You are eligible to schedule your annual wellness visit with our nurse specialist Otila Kluver.  Please consider scheduling this before you leave today  Cut down on sugar intake-could be increasing gassiness  Recommended follow up: Return for next already scheduled visit or sooner if needed.

## 2022-06-03 NOTE — Telephone Encounter (Signed)
Called and spoke with pt and made aware I will place vaccine print out in the mail to her.

## 2022-08-13 ENCOUNTER — Telehealth: Payer: Self-pay | Admitting: Family Medicine

## 2022-08-13 NOTE — Telephone Encounter (Signed)
Patient states she is not using Spray anymore.  Patient's new Preferred Pharmacy is:   [id number] - [place], [place] [address] [place] [address] Phone: [phone]  Fax: [phone]    LAST APPOINTMENT DATE:  06/03/22  NEXT APPOINTMENT DATE:  MEDICATION: gabapentin (NEURONTIN) 100 MG capsule   Is the patient out of medication? Yes  PHARMACY:  [facility] [phone] - [place], [place] - [address] [place] [address] [place] [address] Phone: [phone]  Fax: [phone]    Let patient know to contact pharmacy at the end of the day to make sure medication is ready.  Please notify patient to allow 48-72 hours to process

## 2022-08-16 DIAGNOSIS — L814 Other melanin hyperpigmentation: Secondary | ICD-10-CM | POA: Diagnosis not present

## 2022-08-16 DIAGNOSIS — L821 Other seborrheic keratosis: Secondary | ICD-10-CM | POA: Diagnosis not present

## 2022-08-16 DIAGNOSIS — L720 Epidermal cyst: Secondary | ICD-10-CM | POA: Diagnosis not present

## 2022-08-16 DIAGNOSIS — D1801 Hemangioma of skin and subcutaneous tissue: Secondary | ICD-10-CM | POA: Diagnosis not present

## 2022-08-17 MED ORDER — GABAPENTIN 100 MG PO CAPS: 100.0000 mg | ORAL_CAPSULE | Freq: Three times a day (TID) | ORAL | 3 refills | Status: DC

## 2022-08-17 NOTE — Telephone Encounter (Signed)
Rx sent to preferred pharmacy 

## 2022-08-21 ENCOUNTER — Other Ambulatory Visit: Payer: Self-pay | Admitting: Family Medicine

## 2022-09-09 ENCOUNTER — Encounter: Payer: TRICARE For Life (TFL) | Admitting: Family Medicine

## 2022-09-16 DIAGNOSIS — J3089 Other allergic rhinitis: Secondary | ICD-10-CM | POA: Diagnosis not present

## 2022-09-23 ENCOUNTER — Telehealth: Payer: Self-pay

## 2022-09-23 NOTE — Telephone Encounter (Signed)
Contacted Nicole Dawson to schedule their annual wellness visit. Appointment made for 09/27/22.  Norton Blizzard, [place] ([facility])  [facility] [facility] [phone]

## 2022-09-27 ENCOUNTER — Ambulatory Visit (INDEPENDENT_AMBULATORY_CARE_PROVIDER_SITE_OTHER): Payer: Medicare Other

## 2022-09-27 VITALS — Wt 169.0 lb

## 2022-09-27 DIAGNOSIS — Z0000 Encounter for general adult medical examination without abnormal findings: Secondary | ICD-10-CM | POA: Diagnosis not present

## 2022-09-27 NOTE — Patient Instructions (Signed)
Nicole Dawson , Thank you for taking time to come for your Medicare Wellness Visit. I appreciate your ongoing commitment to your health goals. Please review the following plan we discussed and let me know if I can assist you in the future.   These are the goals we discussed:  Goals      Patient Stated     Lose weight         This is a list of the screening recommended for you and due dates: 72  Health Maintenance  Topic Date Due   Medicare Annual Wellness Visit  09/28/2023   Colon Cancer Screening  11/25/2025   DTaP/Tdap/Td vaccine (3 - Tdap) 03/18/2032   Pneumonia Vaccine  Completed   Flu Shot  Completed   DEXA scan (bone density measurement)  Completed   Hepatitis C Screening: USPSTF Recommendation to screen - Ages 50-79 yo.  Completed   Zoster (Shingles) Vaccine  Completed   HPV Vaccine  Aged Out   COVID-19 Vaccine  Discontinued    Advanced directives: Advance directive discussed with you today. Even though you declined this today please call our office should you change your mind and we can give you the proper paperwork for you to fill out.  Conditions/risks identified: lose weight   Next appointment: Follow up in one year for your annual wellness visit    Preventive Care 70 Years and Older, Female Preventive care refers to lifestyle choices and visits with your health care provider that can promote health and wellness. What does preventive care include? A yearly physical exam. This is also called an annual well check. Dental exams once or twice a year. Routine eye exams. Ask your health care provider how often you should have your eyes checked. Personal lifestyle choices, including: Daily care of your teeth and gums. Regular physical activity. Eating a healthy diet. Avoiding tobacco and drug use. Limiting alcohol use. Practicing safe sex. Taking low-dose aspirin every day. Taking vitamin and mineral supplements as recommended by your health care provider. What  happens during an annual well check? The services and screenings done by your health care provider during your annual well check will depend on your age, overall health, lifestyle risk factors, and family history of disease. Counseling  Your health care provider may ask you questions about your: Alcohol use. Tobacco use. Drug use. Emotional well-being. Home and relationship well-being. Sexual activity. Eating habits. History of falls. Memory and ability to understand (cognition). Work and work Statistician. Reproductive health. Screening  You may have the following tests or measurements: Height, weight, and BMI. Blood pressure. Lipid and cholesterol levels. These may be checked every 5 years, or more frequently if you are over 20 years old. Skin check. Lung cancer screening. You may have this screening every year starting at age 6 if you have a 30-pack-year history of smoking and currently smoke or have quit within the past 15 years. Fecal occult blood test (FOBT) of the stool. You may have this test every year starting at age 70. Flexible sigmoidoscopy or colonoscopy. You may have a sigmoidoscopy every 5 years or a colonoscopy every 10 years starting at age 70. Hepatitis C blood test. Hepatitis B blood test. Sexually transmitted disease (STD) testing. Diabetes screening. This is done by checking your blood sugar (glucose) after you have not eaten for a while (fasting). You may have this done every 1-3 years. Bone density scan. This is done to screen for osteoporosis. You may have this done starting at age 70.  Mammogram. This may be done every 1-2 years. Talk to your health care provider about how often you should have regular mammograms. Talk with your health care provider about your test results, treatment options, and if necessary, the need for more tests. Vaccines  Your health care provider may recommend certain vaccines, such as: Influenza vaccine. This is recommended every  year. Tetanus, diphtheria, and acellular pertussis (Tdap, Td) vaccine. You may need a Td booster every 10 years. Zoster vaccine. You may need this after age 80. Pneumococcal 13-valent conjugate (PCV13) vaccine. One dose is recommended after age 70. Pneumococcal polysaccharide (PPSV23) vaccine. One dose is recommended after age 70. Talk to your health care provider about which screenings and vaccines you need and how often you need them. This information is not intended to replace advice given to you by your health care provider. Make sure you discuss any questions you have with your health care provider. Document Released: 08/15/2015 Document Revised: 04/07/2016 Document Reviewed: 05/20/2015 Elsevier Interactive Patient Education  2017 [employer] Prevention in the Home Falls can cause injuries. They can happen to people of all ages. There are many things you can do to make your home safe and to help prevent falls. What can I do on the outside of my home? Regularly fix the edges of walkways and driveways and fix any cracks. Remove anything that might make you trip as you walk through a door, such as a raised step or threshold. Trim any bushes or trees on the path to your home. Use bright outdoor lighting. Clear any walking paths of anything that might make someone trip, such as rocks or tools. Regularly check to see if handrails are loose or broken. Make sure that both sides of any steps have handrails. Any raised decks and porches should have guardrails on the edges. Have any leaves, snow, or ice cleared regularly. Use sand or salt on walking paths during winter. Clean up any spills in your garage right away. This includes oil or grease spills. What can I do in the bathroom? Use night lights. Install grab bars by the toilet and in the tub and shower. Do not use towel bars as grab bars. Use non-skid mats or decals in the tub or shower. If you need to sit down in the shower, use a  plastic, non-slip stool. Keep the floor dry. Clean up any water that spills on the floor as soon as it happens. Remove soap buildup in the tub or shower regularly. Attach bath mats securely with double-sided non-slip rug tape. Do not have throw rugs and other things on the floor that can make you trip. What can I do in the bedroom? Use night lights. Make sure that you have a light by your bed that is easy to reach. Do not use any sheets or blankets that are too big for your bed. They should not hang down onto the floor. Have a firm chair that has side arms. You can use this for support while you get dressed. Do not have throw rugs and other things on the floor that can make you trip. What can I do in the kitchen? Clean up any spills right away. Avoid walking on wet floors. Keep items that you use a lot in easy-to-reach places. If you need to reach something above you, use a strong step stool that has a grab bar. Keep electrical cords out of the way. Do not use floor polish or wax that makes floors slippery. If  you must use wax, use non-skid floor wax. Do not have throw rugs and other things on the floor that can make you trip. What can I do with my stairs? Do not leave any items on the stairs. Make sure that there are handrails on both sides of the stairs and use them. Fix handrails that are broken or loose. Make sure that handrails are as long as the stairways. Check any carpeting to make sure that it is firmly attached to the stairs. Fix any carpet that is loose or worn. Avoid having throw rugs at the top or bottom of the stairs. If you do have throw rugs, attach them to the floor with carpet tape. Make sure that you have a light switch at the top of the stairs and the bottom of the stairs. If you do not have them, ask someone to add them for you. What else can I do to help prevent falls? Wear shoes that: Do not have high heels. Have rubber bottoms. Are comfortable and fit you  well. Are closed at the toe. Do not wear sandals. If you use a stepladder: Make sure that it is fully opened. Do not climb a closed stepladder. Make sure that both sides of the stepladder are locked into place. Ask someone to hold it for you, if possible. Clearly mark and make sure that you can see: Any grab bars or handrails. First and last steps. Where the edge of each step is. Use tools that help you move around (mobility aids) if they are needed. These include: Canes. Walkers. Scooters. Crutches. Turn on the lights when you go into a dark area. Replace any light bulbs as soon as they burn out. Set up your furniture so you have a clear path. Avoid moving your furniture around. If any of your floors are uneven, fix them. If there are any pets around you, be aware of where they are. Review your medicines with your doctor. Some medicines can make you feel dizzy. This can increase your chance of falling. Ask your doctor what other things that you can do to help prevent falls. This information is not intended to replace advice given to you by your health care provider. Make sure you discuss any questions you have with your health care provider. Document Released: 05/15/2009 Document Revised: 12/25/2015 Document Reviewed: 08/23/2014 Elsevier Interactive Patient Education  2017 [employer].

## 2022-09-27 NOTE — Progress Notes (Signed)
I connected with  Zeb Comfort on 09/27/22 by a audio enabled telemedicine application and verified that I am speaking with the correct person using two identifiers.  Patient Location: Home  Provider Location: Office/Clinic  I discussed the limitations of evaluation and management by telemedicine. The patient expressed understanding and agreed to proceed.   Subjective:   Nicole Dawson is a 72 y.o. female who presents for Medicare Annual (Subsequent) preventive examination.  Review of Systems     Cardiac Risk Factors include: advanced age (>22mn, >>71women);dyslipidemia;obesity (BMI >30kg/m2)     Objective:    Today's Vitals   09/27/22 1532  Weight: 169 lb (76.7 kg)   Body mass index is 31.93 kg/m.     09/27/2022    3:40 PM 01/07/2021    8:46 AM 07/13/2019    9:52 AM 07/08/2015    5:09 PM  Advanced Directives  Does Patient Have a Medical Advance Directive? No No Yes No  Type of Advance Directive   Living will;Healthcare Power of Attorney   Does patient want to make changes to medical advance directive?   No - Patient declined   Copy of [place]?   No - copy requested   Would patient like information on creating a medical advance directive? No - Patient declined Yes (MAU/Ambulatory/Procedural Areas - Information given)  No - patient declined information    Current Medications (verified) Outpatient Encounter Medications as of 09/27/2022  Medication Sig   aspirin EC 81 MG tablet Take 81 mg by mouth at bedtime.    atorvastatin (LIPITOR) 20 MG tablet TAKE 1 TABLET twice A WEEK   Bioflavonoid Products (ESTER C PO) Take 1 tablet by mouth 2 (two) times daily.   Biotin 10000 MCG TABS Take 1 tablet by mouth daily.   Carboxymethylcellul-Glycerin (LUBRICATING EYE DROPS OP) Apply 1 drop to eye as needed (dry eyes).    cetirizine (ZYRTEC) 10 MG tablet Take 10 mg by mouth daily as needed for allergies.   cholecalciferol (VITAMIN D) 1000 units tablet  Take 1,000 Units by mouth 2 (two) times daily at 8 am and 10 pm.   Coenzyme Q10 (COQ-10 PO) Take 1 capsule by mouth daily in the afternoon.   cyanocobalamin 1000 MCG tablet Take 1,000 mcg by mouth daily.   gabapentin (NEURONTIN) 100 MG capsule Take 1 capsule (100 mg total) by mouth 3 (three) times daily.   ketotifen (ZADITOR) 0.025 % ophthalmic solution Place 1 drop into both eyes daily.   montelukast (SINGULAIR) 10 MG tablet Take 10 mg by mouth at bedtime.   Multiple Vitamin (MULTIVITAMIN) tablet Take 1 tablet by mouth daily.   MYRBETRIQ 25 MG TB24 tablet TAKE 1 TABLET BY MOUTH EVERY DAY   nitroGLYCERIN (NITRO-DUR) 0.1 mg/hr patch 1/4 patch daily   OVER THE COUNTER MEDICATION Caltrate   triamcinolone (NASACORT AQ) 55 MCG/ACT AERO nasal inhaler Place 2 sprays into the nose daily.   valACYclovir (VALTREX) 500 MG tablet Take 500 mg by mouth at bedtime.    No facility-administered encounter medications on file as of 09/27/2022.    Allergies (verified) Tape, Dust mite extract, and Molds & smuts   History: Past Medical History:  Diagnosis Date   Allergy    Breast cancer ([place]    Depression    Diverticulitis    Diverticulosis    Genital herpes    Heart murmur    HLD (hyperlipidemia)    Hx of tobacco use, presenting hazards to health 07/08/2015  22 years at 1 PPD   IBS (irritable bowel syndrome) 07/08/2015   Diagnosis by Dr. Conley Canal her prior PCP   PONV (postoperative nausea and vomiting)    Sigmoid diverticulitis 07/08/2015   Past Surgical History:  Procedure Laterality Date   APPENDECTOMY  1962   BILATERAL TOTAL MASTECTOMY WITH AXILLARY LYMPH NODE DISSECTION  2005   bilateral mastectomy   BREAST RECONSTRUCTION  2005   TUBAL LIGATION  1984   Family History  Problem Relation Age of Onset   Hyperlipidemia Mother    Hypertension Mother    COPD Mother    Stroke Mother        in 41s.    Heart attack Father        67.    Tuberculosis Father        1/2 of lung removed.     Hyperlipidemia Father    Lymphoma Maternal Aunt    Skin cancer Paternal Grandmother    Social History   Socioeconomic History   Marital status: Married    Spouse name: Not on file   Number of children: 2   Years of education: Not on file   Highest education level: Not on file  Occupational History   Occupation: CNA  Tobacco Use   Smoking status: Former    Packs/day: 1.00    Years: 30.00    Total pack years: 30.00    Types: Cigarettes    Quit date: 07/07/1992    Years since quitting: 30.2   Smokeless tobacco: Never  Substance and Sexual Activity   Alcohol use: No   Drug use: No   Sexual activity: Not on file  Other Topics Concern   Not on file  Social History Narrative   Lives in [place], previously lived in [place] until 2020. Married 2007. 2 kids (son and daughter). 2 grandkids (granddaughter and grandson). Both in [place]. Grandson on on the way (son's) in April 2016.       Works at [employer]- a branch of [employer].    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       Hobbies: computer games, tv   Social Determinants of Health   Financial Resource Strain: Low Risk  (09/27/2022)   Overall Financial Resource Strain (CARDIA)    Difficulty of Paying Living Expenses: Not hard at all  Food Insecurity: No Food Insecurity (09/27/2022)   Hunger Vital Sign    Worried About Running Out of Food in the Last Year: Never true    Ran Out of Food in the Last Year: Never true  Transportation Needs: No Transportation Needs (09/27/2022)   PRAPARE - Hydrologist (Medical): No    Lack of Transportation (Non-Medical): No  Physical Activity: Inactive (09/27/2022)   Exercise Vital Sign    Days of Exercise per Week: 0 days    Minutes of Exercise per Session: 0 min  Stress: No Stress Concern Present (09/27/2022)   [place]    Feeling of Stress : Not at all   Social Connections: Moderately Integrated (09/27/2022)   Social Connection and Isolation Panel [NHANES]    Frequency of Communication with Friends and Family: Three times a week    Frequency of Social Gatherings with Friends and Family: More than three times a week    Attends Religious Services: More than 4 times per year    Active Member of Clubs or Organizations: No  Attends Archivist Meetings: Never    Marital Status: Married    Tobacco Counseling Counseling given: Not Answered   Clinical Intake:  Pre-visit preparation completed: Yes  Pain : No/denies pain     BMI - recorded: 31.93 Nutritional Status: BMI > 30  Obese Nutritional Risks: None Diabetes: No  How often do you need to have someone help you when you read instructions, pamphlets, or other written materials from your doctor or pharmacy?: 1 - Never  Diabetic?no  Interpreter Needed?: No  Information entered by :: Charlott Rakes, LPN   Activities of Daily Living    09/27/2022    3:41 PM  In your present state of health, do you have any difficulty performing the following activities:  Hearing? 0  Vision? 0  Difficulty concentrating or making decisions? 0  Walking or climbing stairs? 0  Dressing or bathing? 0  Doing errands, shopping? 0  Preparing Food and eating ? N  Using the Toilet? N  In the past six months, have you accidently leaked urine? Y  Comment at times  Do you have problems with loss of bowel control? N  Managing your Medications? N  Managing your Finances? N  Housekeeping or managing your Housekeeping? N    Patient Care Team: Marin Olp, MD as PCP - General (Family Medicine) Vania Rea, MD as Consulting Physician (Obstetrics and Gynecology) Lyndal Pulley, DO as Consulting Physician (Sports Medicine) [facility] as Consulting Physician Nechama Guard, MD as Consulting Physician (Plastic Surgery) Sheilah Mins, MD as Consulting Physician  (Surgical Oncology) Tiajuana Amass, MD as Consulting Physician (Allergy and Immunology)  Indicate any recent Medical Services you may have received from other than [facility] providers in the past year (date may be approximate).     Assessment:   This is a routine wellness examination for [facility].  Hearing/Vision screen Hearing Screening - Comments:: Pt denies any hearing issues  Vision Screening - Comments:: Pt follows up with Dr Satira Sark for annual eye exams   Dietary issues and exercise activities discussed: Current Exercise Habits: The patient does not participate in regular exercise at present   Goals Addressed             This Visit's Progress    Patient Stated       Lose weight        Depression Screen    09/27/2022    3:36 PM 09/07/2021    7:58 AM 07/13/2019    9:53 AM 04/13/2019   11:13 AM 02/23/2019    3:55 PM 04/06/2018   10:26 AM 03/24/2018    1:11 PM  PHQ 2/9 Scores  PHQ - 2 Score 1 0 0 0 0 0 0  PHQ- 9 Score  0  2       Fall Risk    09/27/2022    3:41 PM 09/07/2021    7:58 AM 12/09/2020    3:49 PM 07/13/2019    9:53 AM 04/13/2019   11:08 AM  [place] in the past year? 0 0 1 0 1  Number falls in past yr: 0 0 1  0  Injury with Fall? 0 0 1 0 1  Risk for fall due to : Impaired vision      Follow up Falls prevention discussed   Falls evaluation completed;Education provided;Falls prevention discussed     FALL RISK PREVENTION PERTAINING TO THE HOME:  Any stairs in or around the home? Yes  If  so, are there any without handrails? No  Home free of loose throw rugs in walkways, pet beds, electrical cords, etc? Yes  Adequate lighting in your home to reduce risk of falls? Yes   ASSISTIVE DEVICES UTILIZED TO PREVENT FALLS:  Life alert? No  Use of a cane, walker or w/c? No  Grab bars in the bathroom? No  Shower chair or bench in shower? Yes  Elevated toilet seat or a handicapped toilet? No   TIMED UP AND GO:  Was the test performed? No .   Cognitive  Function:        09/27/2022    3:42 PM 07/13/2019    9:52 AM  6CIT Screen  What Year? 0 points 0 points  What month? 0 points 0 points  What time? 0 points 0 points  Count back from 20 0 points 0 points  Months in reverse 0 points 0 points  Repeat phrase 0 points 0 points  Total Score 0 points 0 points    Immunizations Immunization History  Administered Date(s) Administered   Fluad Quad(high Dose 65+) 04/13/2019, 05/15/2021, 06/03/2022   Hepatitis B 04/02/2013   Influenza,inj,Quad PF,6+ Mos 05/29/2018   Influenza-Unspecified 04/02/2013, 05/02/2014, 06/11/2014, 06/18/2015, 06/02/2016, 03/28/2017, 05/03/2019   PFIZER(Purple Top)SARS-COV-2 Vaccination 07/24/2019, 08/15/2019   Pneumococcal Conjugate-13 03/24/2018   Pneumococcal Polysaccharide-23 04/13/2019   Td 05/28/2009, 03/18/2022   Zoster Recombinat (Shingrix) 02/23/2021, 06/12/2021   Zoster, Live 07/27/2016    TDAP status: Up to date  Flu Vaccine status: Up to date  Pneumococcal vaccine status: Up to date  Covid-19 vaccine status: Completed vaccines  Qualifies for Shingles Vaccine? Yes   Zostavax completed Yes   Shingrix Completed?: Yes  Screening Tests Health Maintenance  Topic Date Due   Medicare Annual Wellness (AWV)  09/28/2023   COLONOSCOPY (Pts 45-63yr Insurance coverage will need to be confirmed)  11/25/2025   DTaP/Tdap/Td (3 - Tdap) 03/18/2032   Pneumonia Vaccine 70 Years old  Completed   INFLUENZA VACCINE  Completed   DEXA SCAN  Completed   Hepatitis C Screening  Completed   Zoster Vaccines- Shingrix  Completed   HPV VACCINES  Aged Out   COVID-19 Vaccine  Discontinued    Health Maintenance  There are no preventive care reminders to display for this patient.   Colorectal cancer screening: Type of screening: Colonoscopy. Completed 11/26/15/. Repeat every 10 years  Mammogram status: No longer required due to history .  Bone Density status: Completed 01/19/18. Results reflect: Bone density  results: OSTEOPENIA. Repeat every 2 years.   Additional Screening:  Hepatitis C Screening:  Completed 07/05/14  Vision Screening: Recommended annual ophthalmology exams for early detection of glaucoma and other disorders of the eye. Is the patient up to date with their annual eye exam?  Yes  Who is the provider or what is the name of the office in which the patient attends annual eye exams? Dr TSatira Sark If pt is not established with a provider, would they like to be referred to a provider to establish care? No .   Dental Screening: Recommended annual dental exams for proper oral hygiene  Community Resource Referral / Chronic Care Management: CRR required this visit?  No   CCM required this visit?  No      Plan:     I have personally reviewed and noted the following in the patient's chart:   Medical and social history Use of alcohol, tobacco or illicit drugs  Current medications and supplements including opioid prescriptions. Patient  is not currently taking opioid prescriptions. Functional ability and status Nutritional status Physical activity Advanced directives List of other physicians Hospitalizations, surgeries, and ER visits in previous 12 months Vitals Screenings to include cognitive, depression, and falls Referrals and appointments  In addition, I have reviewed and discussed with patient certain preventive protocols, quality metrics, and best practice recommendations. A written personalized care plan for preventive services as well as general preventive health recommendations were provided to patient.     Willette Brace, LPN   [id number]   Nurse Notes: none

## 2022-10-25 ENCOUNTER — Ambulatory Visit (INDEPENDENT_AMBULATORY_CARE_PROVIDER_SITE_OTHER): Payer: Medicare Other | Admitting: Family

## 2022-10-25 ENCOUNTER — Telehealth: Payer: Self-pay | Admitting: Family Medicine

## 2022-10-25 ENCOUNTER — Other Ambulatory Visit: Payer: Self-pay

## 2022-10-25 ENCOUNTER — Ambulatory Visit (INDEPENDENT_AMBULATORY_CARE_PROVIDER_SITE_OTHER)
Admission: RE | Admit: 2022-10-25 | Discharge: 2022-10-25 | Disposition: A | Discharge status: Home / Self Care | Payer: Medicare Other | Source: Ambulatory Visit | Attending: Family Medicine | Admitting: Family Medicine

## 2022-10-25 ENCOUNTER — Encounter: Payer: Self-pay | Admitting: Family

## 2022-10-25 VITALS — BP 112/76 | HR 80 | Temp 98.2°F | Ht 61.0 in | Wt 174.4 lb

## 2022-10-25 DIAGNOSIS — Z780 Asymptomatic menopausal state: Secondary | ICD-10-CM

## 2022-10-25 DIAGNOSIS — R42 Dizziness and giddiness: Secondary | ICD-10-CM

## 2022-10-25 MED ORDER — MECLIZINE HCL 12.5 MG PO TABS: 12.5000 mg | ORAL_TABLET | Freq: Three times a day (TID) | ORAL | 0 refills | Status: DC | PRN

## 2022-10-25 NOTE — Telephone Encounter (Signed)
Bone density placed.

## 2022-10-25 NOTE — Telephone Encounter (Signed)
[place] with [facility] states they need a Bone Density Order-Patient is scheduled for today at 10:30 am

## 2022-10-25 NOTE — Patient Instructions (Signed)
It was very nice to see you today!   I have sent over Meclizine, a medication to help your dizziness. This may cause drowsiness, so don't plan to drive until you know how it will affect you. See the attached handout for more information. Let us know if the medication is not helping and we can refer you to physical therapy.      PLEASE NOTE:  If you had any lab tests please let us know if you have not heard back within a few days. You may see your results on MyChart before we have a chance to review them but we will give you a call once they are reviewed by [place]. If we ordered any referrals today, please let us know if you have not heard from their office within the next week.

## 2022-10-25 NOTE — Progress Notes (Signed)
Patient ID: Nicole Dawson, female    DOB: 03/30/1951, 72 y.o.   MRN: [id number]  Chief Complaint  Patient presents with   Dizziness    sx for a few weeks.    HPI:      Dizziness:  Pt c/o dizziness when she turns to her right side, Present for 3 weeks. Used netipot when she had a sinus infection and dizziness started. Denies having sx in past. Also feels sx when bending head back or leaning head down, bending forward. Denies nausea. States she takes zyrtec, singulair, & steroid nasal spray daily       Assessment & Plan:  1. Vertigo - sending meclizine, advised on use & SE, informational handout also provided, call back if sx are not resolving, can refer to PT.  - meclizine (ANTIVERT) 12.5 MG tablet; Take 1 tablet (12.5 mg total) by mouth 3 (three) times daily as needed for dizziness (May cause drowsiness.).  Dispense: 30 tablet; Refill: 0     Subjective:    Outpatient Medications Prior to Visit  Medication Sig Dispense Refill   aspirin EC 81 MG tablet Take 81 mg by mouth at bedtime.      atorvastatin (LIPITOR) 20 MG tablet TAKE 1 TABLET twice A WEEK 26 tablet 3   Bioflavonoid Products (ESTER C PO) Take 1 tablet by mouth 2 (two) times daily.     Biotin 10000 MCG TABS Take 1 tablet by mouth daily.     Carboxymethylcellul-Glycerin (LUBRICATING EYE DROPS OP) Apply 1 drop to eye as needed (dry eyes).      cetirizine (ZYRTEC) 10 MG tablet Take 10 mg by mouth daily as needed for allergies.     cholecalciferol (VITAMIN D) 1000 units tablet Take 1,000 Units by mouth 2 (two) times daily at 8 am and 10 pm.     Coenzyme Q10 (COQ-10 PO) Take 1 capsule by mouth daily in the afternoon.     cyanocobalamin 1000 MCG tablet Take 1,000 mcg by mouth daily.     gabapentin (NEURONTIN) 100 MG capsule Take 1 capsule (100 mg total) by mouth 3 (three) times daily. 270 capsule 3   ketotifen (ZADITOR) 0.025 % ophthalmic solution Place 1 drop into both eyes daily.     montelukast (SINGULAIR) 10 MG tablet  Take 10 mg by mouth at bedtime.     Multiple Vitamin (MULTIVITAMIN) tablet Take 1 tablet by mouth daily.     MYRBETRIQ 25 MG TB24 tablet TAKE 1 TABLET BY MOUTH EVERY DAY 90 tablet 3   nitroGLYCERIN (NITRO-DUR) 0.1 mg/hr patch 1/4 patch daily 30 patch 12   OVER THE COUNTER MEDICATION Caltrate     triamcinolone (NASACORT AQ) 55 MCG/ACT AERO nasal inhaler Place 2 sprays into the nose daily. 1 Inhaler 12   valACYclovir (VALTREX) 500 MG tablet Take 500 mg by mouth at bedtime.      No facility-administered medications prior to visit.   Past Medical History:  Diagnosis Date   Allergy    Breast cancer ([place])    Depression    Diverticulitis    Diverticulosis    Genital herpes    Heart murmur    HLD (hyperlipidemia)    Hx of tobacco use, presenting hazards to health 07/08/2015   22 years at 1 PPD   IBS (irritable bowel syndrome) 07/08/2015   Diagnosis by Dr. Conley Canal her prior PCP   PONV (postoperative nausea and vomiting)    Sigmoid diverticulitis 07/08/2015   Past Surgical History:  Procedure Laterality Date  APPENDECTOMY  1962   BILATERAL TOTAL MASTECTOMY WITH AXILLARY LYMPH NODE DISSECTION  2005   bilateral mastectomy   BREAST RECONSTRUCTION  2005   TUBAL LIGATION  1984   Allergies  Allergen Reactions   Tape Rash   Dust Mite Extract Cough   Molds & Smuts Cough      Objective:    Physical Exam Vitals and nursing note reviewed.  Constitutional:      Appearance: Normal appearance.  HENT:     Right Ear: Tympanic membrane and ear canal normal.     Left Ear: Tympanic membrane and ear canal normal.     Nose: No nasal tenderness or congestion.     Right Sinus: No maxillary sinus tenderness or frontal sinus tenderness.     Left Sinus: No maxillary sinus tenderness or frontal sinus tenderness.     Mouth/Throat:     Mouth: Mucous membranes are moist.     Pharynx: No pharyngeal swelling, oropharyngeal exudate, posterior oropharyngeal erythema or uvula swelling.  Cardiovascular:      Rate and Rhythm: Normal rate and regular rhythm.  Pulmonary:     Effort: Pulmonary effort is normal.     Breath sounds: Normal breath sounds.  Musculoskeletal:        General: Normal range of motion.  Lymphadenopathy:     Head:     Right side of head: No submental, submandibular, preauricular, posterior auricular or occipital adenopathy.     Left side of head: No submental, submandibular, preauricular, posterior auricular or occipital adenopathy.     Cervical: No cervical adenopathy.  Skin:    General: Skin is warm and dry.  Neurological:     Mental Status: She is alert.  Psychiatric:        Mood and Affect: Mood normal.        Behavior: Behavior normal.    BP 112/76 (BP Location: Left Arm, Patient Position: Sitting, Cuff Size: Large)   Pulse 80   Temp 98.2 F (36.8 C) (Temporal)   Ht 5\' 1"  (1.549 m)   Wt 174 lb 6.4 oz (79.1 kg)   SpO2 96%   BMI 32.95 kg/m  Wt Readings from Last 3 Encounters:  10/25/22 174 lb 6.4 oz (79.1 kg)  09/27/22 169 lb (76.7 kg)  06/03/22 169 lb (76.7 kg)       Jeanie Sewer, NP

## 2022-11-01 ENCOUNTER — Other Ambulatory Visit: Payer: Self-pay | Admitting: Family Medicine

## 2023-01-17 IMAGING — DX DG KNEE COMPLETE 4+V*R*
4 series · 4 of 4 positions shown · non-contrast
Comparison: None.

CLINICAL DATA: Bilateral knee pain.  No known injury.

EXAM:
LEFT KNEE - COMPLETE 4+ VIEW; RIGHT KNEE - COMPLETE 4+ VIEW

[knee standing ap]
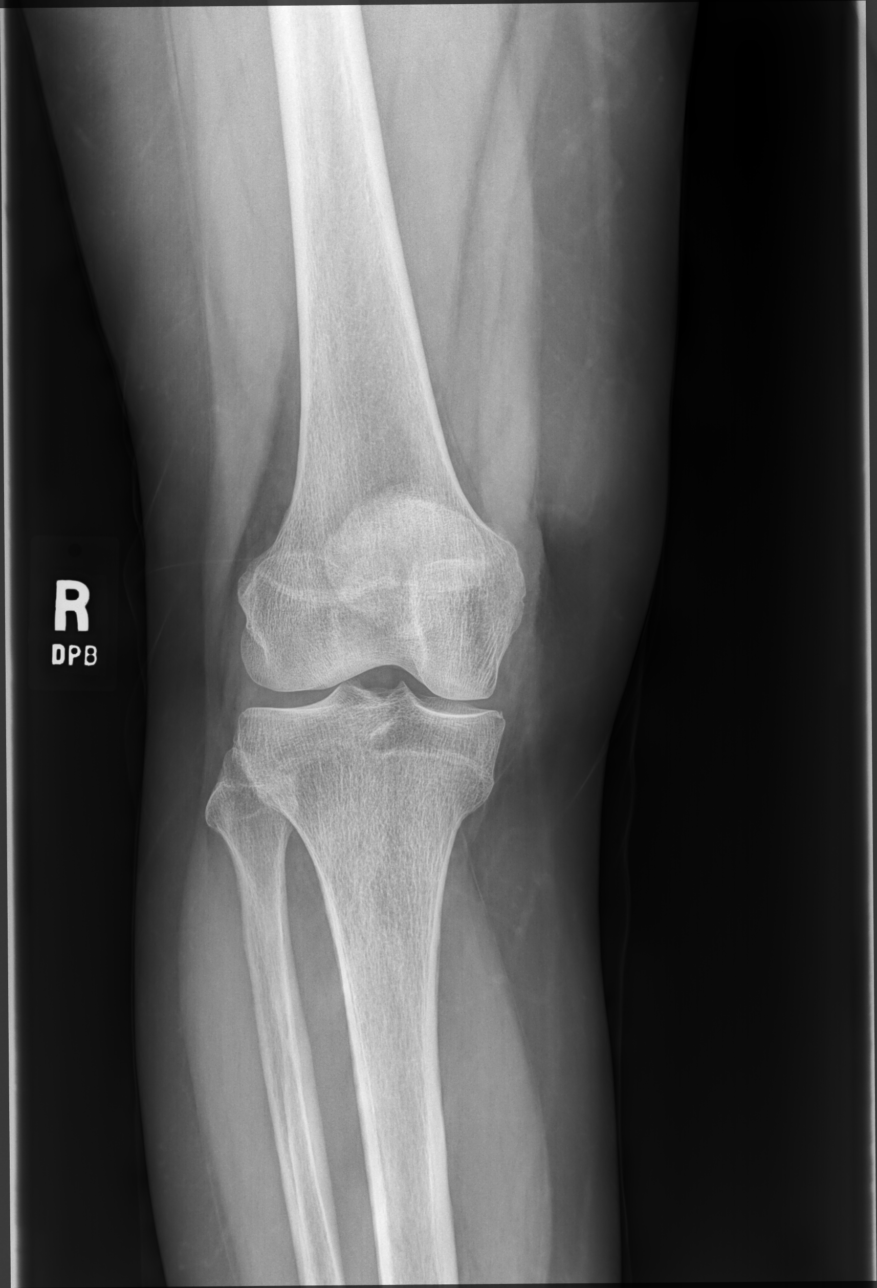

[knee standing lat]
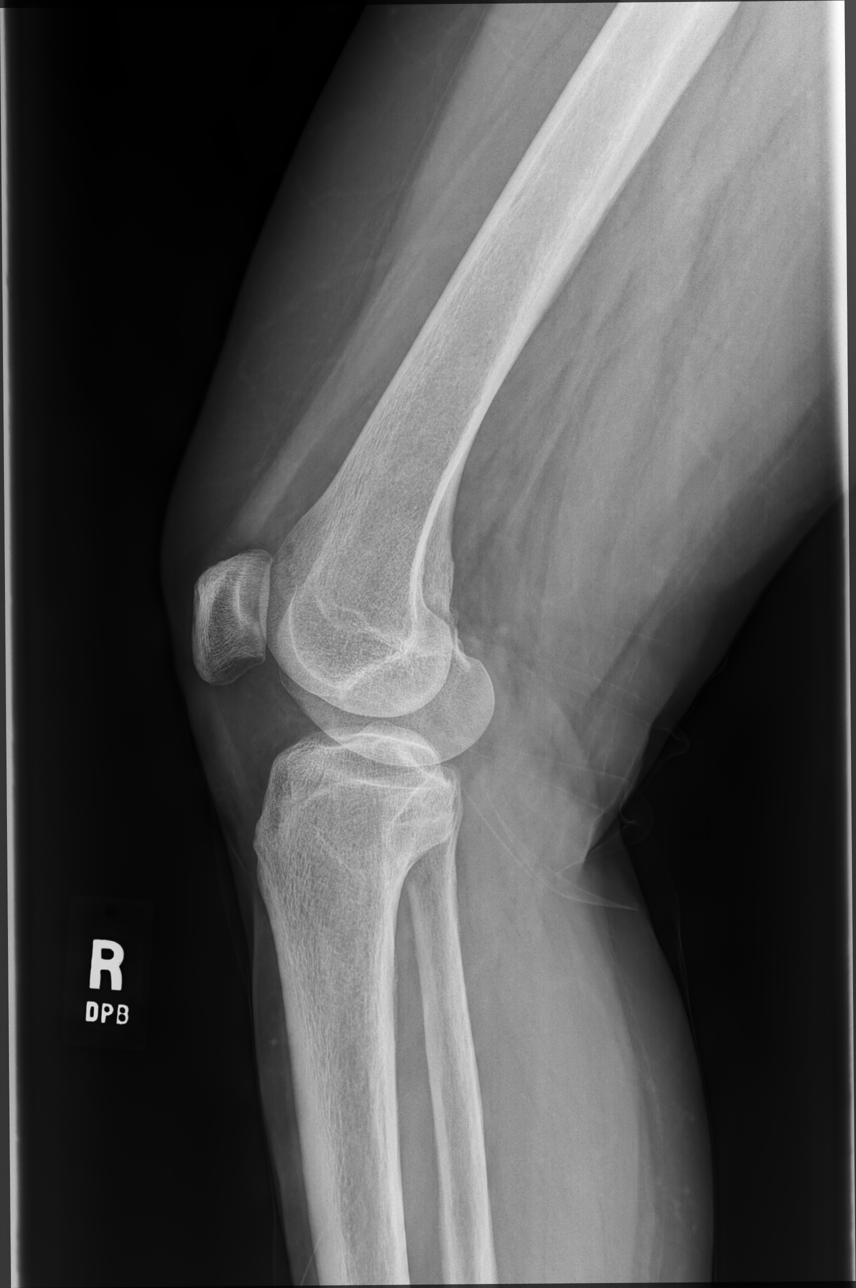

[sunrise]
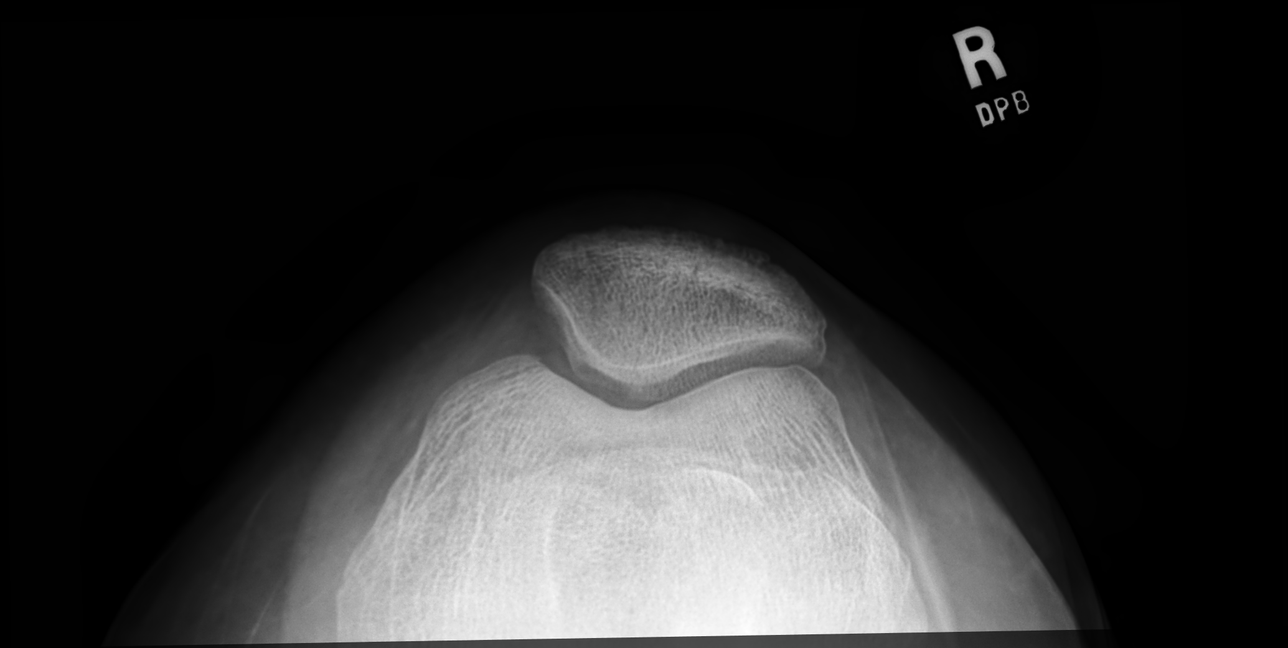

[patella lat]
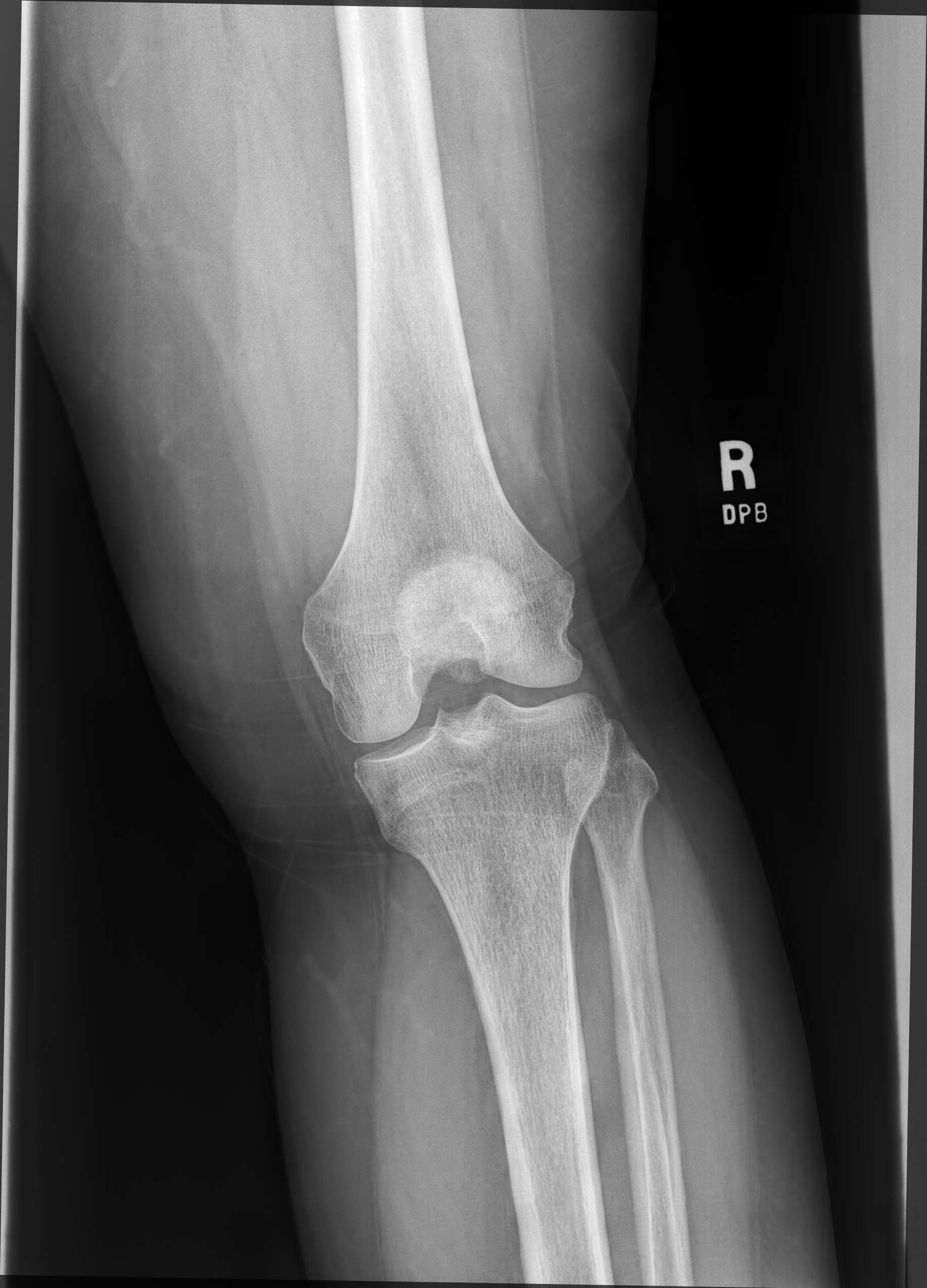

[4 of 4 positions shown; findings below may reference images not displayed]

FINDINGS: No evidence of fracture, dislocation, or joint effusion. No evidence
of arthropathy or other focal bone abnormality. No
chondrocalcinosis. Soft tissues are unremarkable.
IMPRESSION: Negative exam.

## 2023-01-17 IMAGING — DX DG KNEE COMPLETE 4+V*L*
4 series · 4 of 4 positions shown · non-contrast
Comparison: None.

CLINICAL DATA: Bilateral knee pain.  No known injury.

EXAM:
LEFT KNEE - COMPLETE 4+ VIEW; RIGHT KNEE - COMPLETE 4+ VIEW

[knee standing ap]
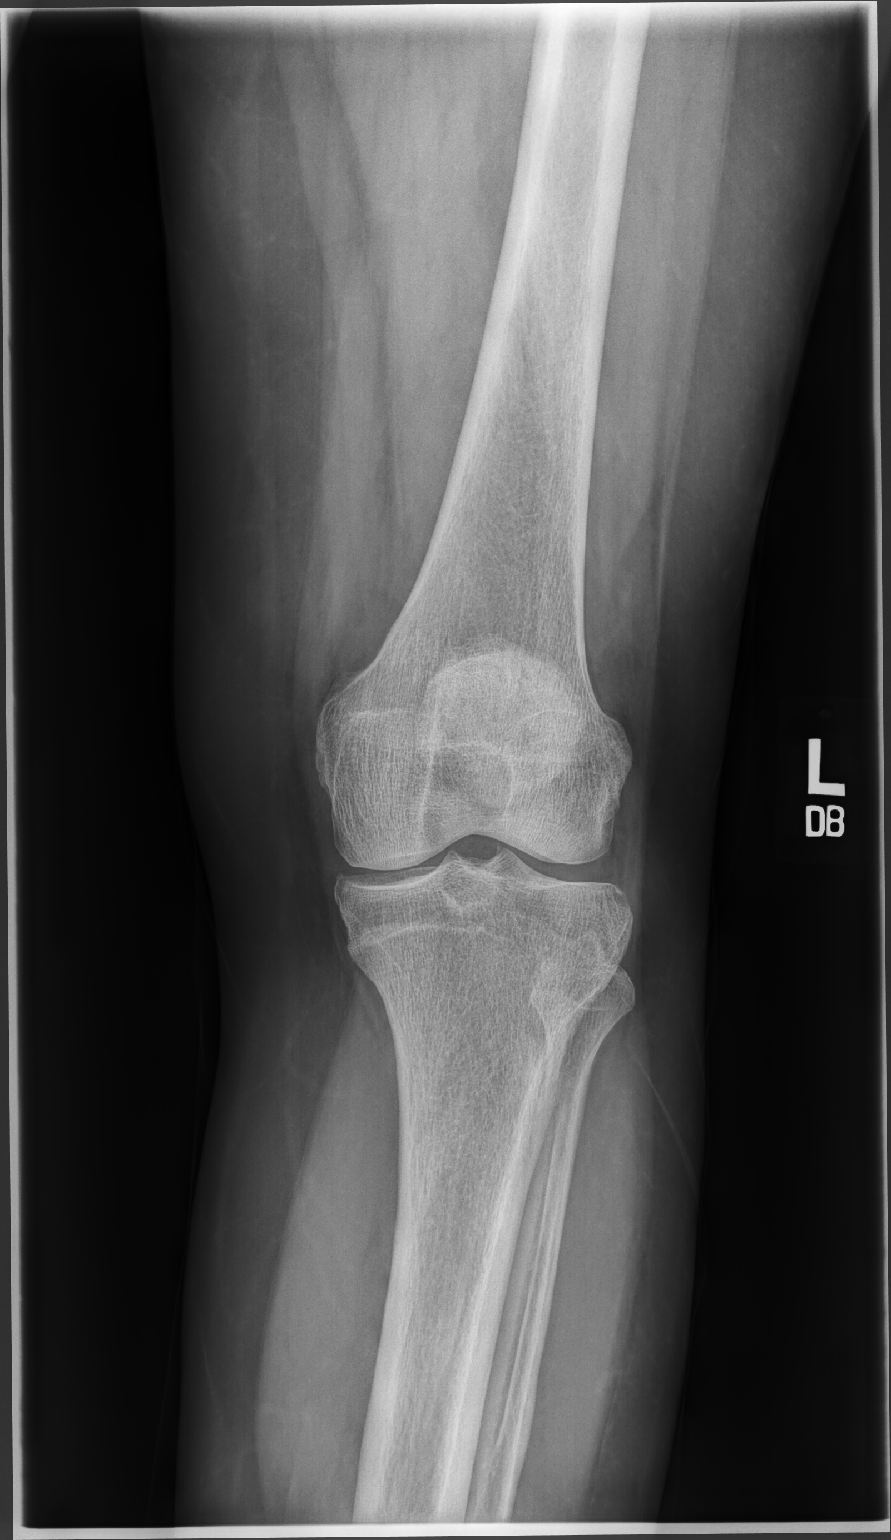

[knee standing lat]
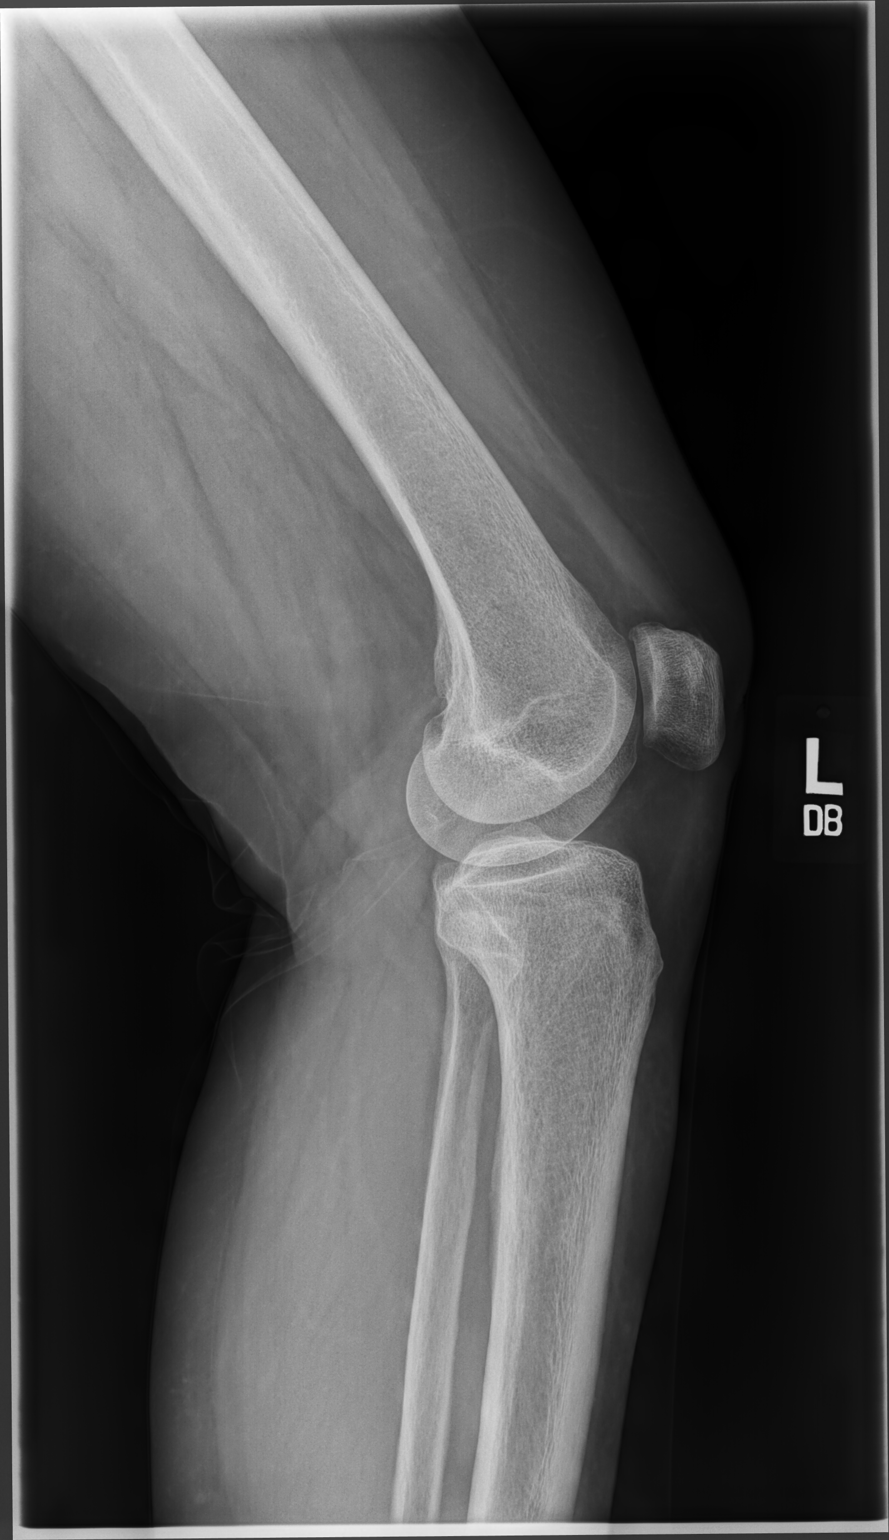

[sunrise]
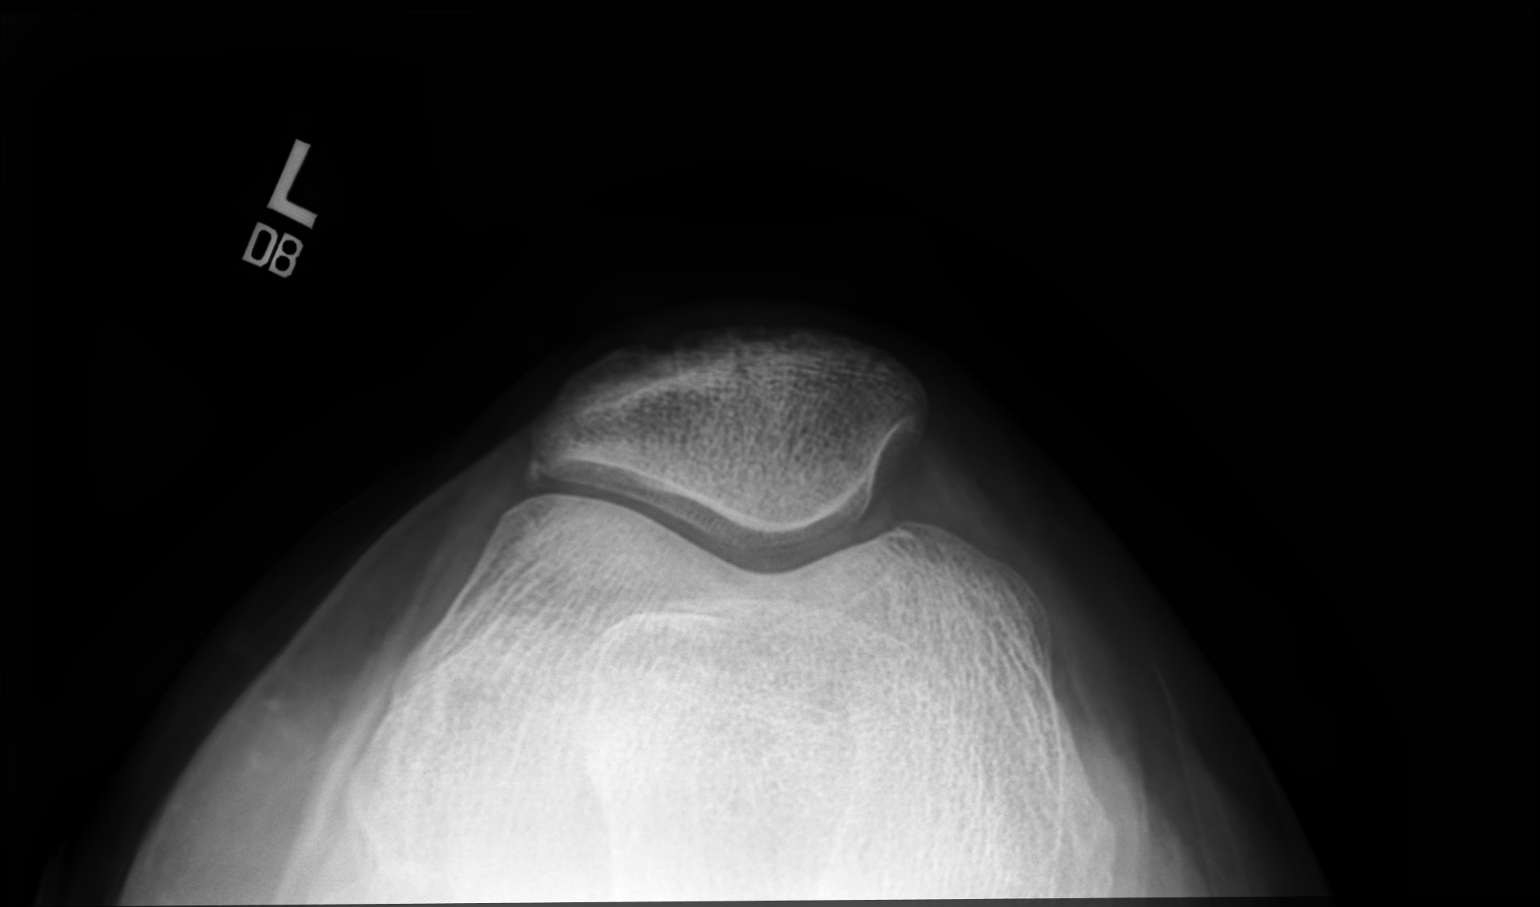

[patella lat]
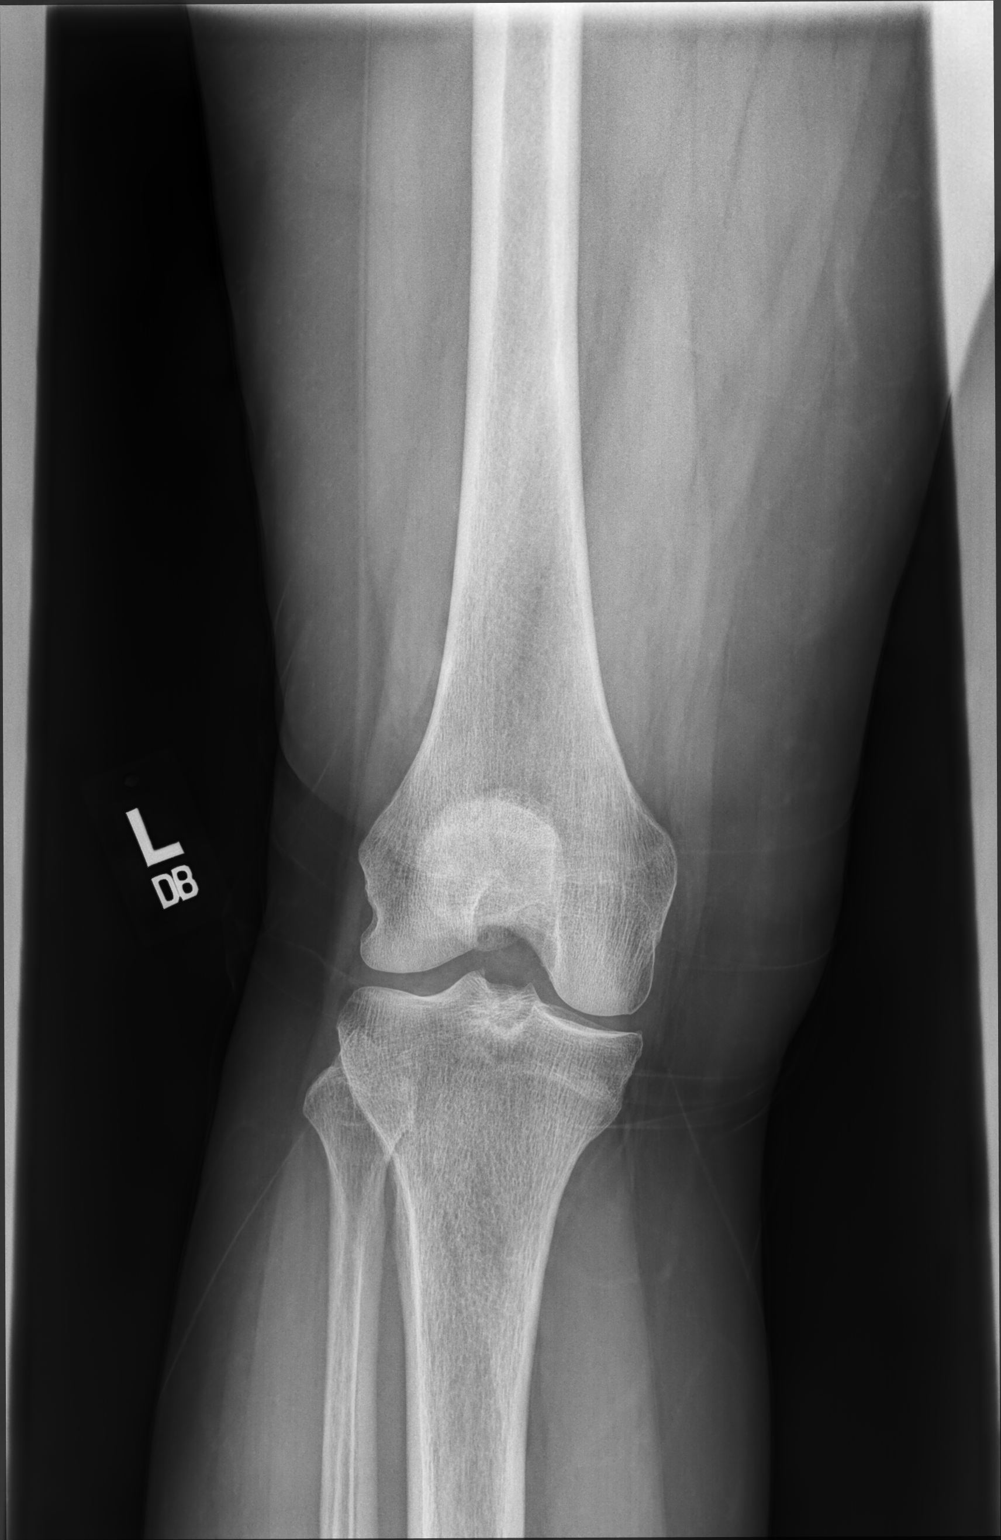

[4 of 4 positions shown; findings below may reference images not displayed]

FINDINGS: No evidence of fracture, dislocation, or joint effusion. No evidence
of arthropathy or other focal bone abnormality. No
chondrocalcinosis. Soft tissues are unremarkable.
IMPRESSION: Negative exam.

## 2023-02-16 ENCOUNTER — Other Ambulatory Visit: Payer: Self-pay

## 2023-02-16 MED ORDER — MIRABEGRON ER 25 MG PO TB24: 25.0000 mg | ORAL_TABLET | Freq: Every day | ORAL | 3 refills | Status: DC

## 2023-02-18 ENCOUNTER — Other Ambulatory Visit (HOSPITAL_COMMUNITY): Payer: Self-pay

## 2023-02-21 ENCOUNTER — Other Ambulatory Visit (HOSPITAL_COMMUNITY): Payer: Self-pay

## 2023-03-01 ENCOUNTER — Encounter: Payer: Self-pay | Admitting: Family Medicine

## 2023-03-01 ENCOUNTER — Ambulatory Visit: Payer: Medicare Other | Admitting: Family Medicine

## 2023-03-01 VITALS — BP 120/74 | HR 75 | Temp 97.0°F | Ht 61.0 in | Wt 170.6 lb

## 2023-03-01 DIAGNOSIS — R739 Hyperglycemia, unspecified: Secondary | ICD-10-CM

## 2023-03-01 DIAGNOSIS — Z0000 Encounter for general adult medical examination without abnormal findings: Secondary | ICD-10-CM

## 2023-03-01 DIAGNOSIS — E785 Hyperlipidemia, unspecified: Secondary | ICD-10-CM

## 2023-03-01 DIAGNOSIS — Z131 Encounter for screening for diabetes mellitus: Secondary | ICD-10-CM

## 2023-03-01 LAB — CBC WITH DIFFERENTIAL/PLATELET
Basophils Absolute: 0 10*3/uL (ref 0.0–0.1)
Basophils Relative: 0.7 % (ref 0.0–3.0)
Eosinophils Absolute: 0.1 10*3/uL (ref 0.0–0.7)
Eosinophils Relative: 1.5 % (ref 0.0–5.0)
HCT: 44.1 % (ref 36.0–46.0)
Hemoglobin: 14.8 g/dL (ref 12.0–15.0)
Lymphocytes Relative: 22.1 % (ref 12.0–46.0)
Lymphs Abs: 1.5 10*3/uL (ref 0.7–4.0)
MCHC: 33.4 g/dL (ref 30.0–36.0)
MCV: 96.6 fl (ref 78.0–100.0)
Monocytes Absolute: 0.5 10*3/uL (ref 0.1–1.0)
Monocytes Relative: 7.1 % (ref 3.0–12.0)
Neutro Abs: 4.8 10*3/uL (ref 1.4–7.7)
Neutrophils Relative %: 68.6 % (ref 43.0–77.0)
Platelets: 260 10*3/uL (ref 150.0–400.0)
RBC: 4.57 Mil/uL (ref 3.87–5.11)
RDW: 13.3 % (ref 11.5–15.5)
WBC: 7 10*3/uL (ref 4.0–10.5)

## 2023-03-01 LAB — LIPID PANEL
Cholesterol: 186 mg/dL (ref 0–200)
HDL: 57.6 mg/dL (ref >39.00)
LDL Cholesterol: 109 mg/dL — ABNORMAL HIGH (ref 0–99)
NonHDL: 128.53
Total CHOL/HDL Ratio: 3
Triglycerides: 97 mg/dL (ref 0.0–149.0)
VLDL: 19.4 mg/dL (ref 0.0–40.0)

## 2023-03-01 LAB — COMPREHENSIVE METABOLIC PANEL
ALT: 32 U/L (ref 0–35)
AST: 23 U/L (ref 0–37)
Albumin: 4.3 g/dL (ref 3.5–5.2)
Alkaline Phosphatase: 109 U/L (ref 39–117)
BUN: 19 mg/dL (ref 6–23)
CO2: 30 mEq/L (ref 19–32)
Calcium: 9.7 mg/dL (ref 8.4–10.5)
Chloride: 104 mEq/L (ref 96–112)
Creatinine, Ser: 0.8 mg/dL (ref 0.40–1.20)
GFR: 74.79 mL/min (ref >60.00)
Glucose, Bld: 98 mg/dL (ref 70–99)
Potassium: 3.8 mEq/L (ref 3.5–5.1)
Sodium: 141 mEq/L (ref 135–145)
Total Bilirubin: 1.1 mg/dL (ref 0.2–1.2)
Total Protein: 7.4 g/dL (ref 6.0–8.3)

## 2023-03-01 LAB — HEMOGLOBIN A1C: Hgb A1c MFr Bld: 5.6 % (ref 4.6–6.5)

## 2023-03-01 NOTE — Patient Instructions (Addendum)
Please stop by lab before you go If you have mychart- we will send your results within 3 business days of [place] receiving them.  If you do not have mychart- we will call you about results within 5 business days of [place] receiving them.  *please also note that you will see labs on mychart as soon as they post. I will later go in and write notes on them- will say "notes from Dr. Durene Cal"   discussed options with her and she wants to increase her weight bearing exercise between walking and weight lifting. She is going to continue vitamin D and goal calcium 1200mg  through diet.  - we discussed repeating in 2 years and if worsens then reconsidering medicine like fosamax -so thankful no falls recently- one of the biggest things to avoid   Recommended follow up: Return in about 1 year (around 02/29/2024) for physical or sooner if needed.Schedule b4 you leave.

## 2023-03-01 NOTE — Progress Notes (Signed)
Phone [phone]   Subjective:  Patient presents today for their annual physical. Chief complaint-noted.   See problem oriented charting- ROS- full  review of systems was completed and negative Per full ROS sheet completed by patient  The following were reviewed and entered/updated in epic: Past Medical History:  Diagnosis Date   Allergy    Breast cancer (HCC)    Depression    Diverticulitis    Diverticulosis    Genital herpes    Heart murmur    HLD (hyperlipidemia)    Hx of tobacco use, presenting hazards to health 07/08/2015   22 years at 1 PPD   IBS (irritable bowel syndrome) 07/08/2015   Diagnosis by Dr. Lendell Caprice her prior PCP   PONV (postoperative nausea and vomiting)    Sigmoid diverticulitis 07/08/2015   Patient Active Problem List   Diagnosis Date Noted   Osteopenia 01/19/2018    Priority: High   Hyperglycemia 04/13/2019    Priority: Medium    IBS (irritable bowel syndrome) 07/08/2015    Priority: Medium    Restless leg syndrome 07/05/2014    Priority: Medium    Hyperlipidemia 05/28/2009    Priority: Medium    BREAST CANCER, HX OF 03/11/2009    Priority: Medium    Obesity (BMI 30.0-34.9) 12/08/2017    Priority: Low   Sigmoid diverticulitis 07/08/2015    Priority: Low   Hx of tobacco use, presenting hazards to health 07/08/2015    Priority: Low   Wheezing 05/30/2013    Priority: Low   TRICUSPID REGURGITATION 05/28/2009    Priority: Low   DEPRESSION 03/11/2009    Priority: Low   Allergic rhinitis 03/11/2009    Priority: Low   HEART MURMUR, HX OF 03/11/2009    Priority: Low   History of herpes genitalis 03/11/2009    Priority: Low   Right rotator cuff tear 10/18/2019    Priority: 1.   Arthritis of right acromioclavicular joint 09/05/2019    Priority: 1.   Subacromial bursitis of right shoulder joint 04/10/2019    Priority: 1.   Greater trochanteric bursitis of left hip 05/10/2016    Priority: 1.   Low back pain 08/03/2012    Priority: 1.    Past Surgical History:  Procedure Laterality Date   APPENDECTOMY  1962   BILATERAL TOTAL MASTECTOMY WITH AXILLARY LYMPH NODE DISSECTION  2005   bilateral mastectomy   BREAST RECONSTRUCTION  2005   TUBAL LIGATION  1984    Family History  Problem Relation Age of Onset   Hyperlipidemia Mother    Hypertension Mother    COPD Mother    Stroke Mother        in 65s.    Heart attack Father        11.    Tuberculosis Father        1/2 of lung removed.    Hyperlipidemia Father    Lymphoma Maternal Aunt    Skin cancer Paternal Grandmother     Medications- reviewed and updated Current Outpatient Medications  Medication Sig Dispense Refill   aspirin EC 81 MG tablet Take 81 mg by mouth at bedtime.      atorvastatin (LIPITOR) 20 MG tablet TAKE 1 TABLET BY MOUTH TWICE WEEKLY 26 tablet 3   Bioflavonoid Products (ESTER C PO) Take 1 tablet by mouth 2 (two) times daily.     Biotin 09811 MCG TABS Take 1 tablet by mouth daily.     Carboxymethylcellul-Glycerin (LUBRICATING EYE DROPS OP) Apply 1 drop  to eye as needed (dry eyes).      cetirizine (ZYRTEC) 10 MG tablet Take 10 mg by mouth daily as needed for allergies.     cholecalciferol (VITAMIN D) 1000 units tablet Take 1,000 Units by mouth 2 (two) times daily at 8 am and 10 pm.     Coenzyme Q10 (COQ-10 PO) Take 1 capsule by mouth daily in the afternoon.     cyanocobalamin 1000 MCG tablet Take 1,000 mcg by mouth daily.     gabapentin (NEURONTIN) 100 MG capsule Take 1 capsule (100 mg total) by mouth 3 (three) times daily. 270 capsule 3   ketotifen (ZADITOR) 0.025 % ophthalmic solution Place 1 drop into both eyes daily.     mirabegron ER (MYRBETRIQ) 25 MG TB24 tablet Take 1 tablet (25 mg total) by mouth daily. 90 tablet 3   montelukast (SINGULAIR) 10 MG tablet Take 10 mg by mouth at bedtime.     Multiple Vitamin (MULTIVITAMIN) tablet Take 1 tablet by mouth daily.     nitroGLYCERIN (NITRO-DUR) 0.1 mg/hr patch 1/4 patch daily 30 patch 12   OVER THE  COUNTER MEDICATION Caltrate     triamcinolone (NASACORT AQ) 55 MCG/ACT AERO nasal inhaler Place 2 sprays into the nose daily. 1 Inhaler 12   valACYclovir (VALTREX) 500 MG tablet Take 500 mg by mouth at bedtime.      No current facility-administered medications for this visit.    Allergies-reviewed and updated Allergies  Allergen Reactions   Tape Rash   Dust Mite Extract Cough   Molds & Smuts Cough    Social History   Social History Narrative   Lives in [place], previously lived in [place] until 2020. Married 2007. 2 kids (son and daughter). 2 grandkids (granddaughter and grandson). Both in [place]. Grandson on on the way (son's) in April 2016.       Works at [employer]- a branch of [employer].    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       Hobbies: computer games, tv   Objective  Objective:  BP 120/74   Pulse 75   Temp (!) 97 F (36.1 C)   Ht 5\' 1"  (1.549 m)   Wt 170 lb 9.6 oz (77.4 kg)   SpO2 94%   BMI 32.23 kg/m  Gen: NAD, resting comfortably HEENT: Mucous membranes are moist. Oropharynx normal Neck: no thyromegaly CV: RRR no murmurs rubs or gallops Lungs: CTAB no crackles, wheeze, rhonchi Abdomen: soft/nontender/nondistended/normal bowel sounds. No rebound or guarding.  Ext: no edema Skin: warm, dry Neuro: grossly normal, moves all extremities, PERRLA   Assessment and Plan   72 y.o. female presenting for annual physical.  Health Maintenance counseling: 1. Anticipatory guidance: Patient counseled regarding regular dental exams -q6 months, eye exams -every other year,  avoiding smoking and second hand smoke , limiting alcohol to 1 beverage per day , no illicit drugs .   2. Risk factor reduction:  Advised patient of need for regular exercise and diet rich and fruits and vegetables to reduce risk of heart attack and stroke.  Exercise- joined [employer] but hard as caring for  husband with recent surgery- wants to restart when things settle.  Diet/weight management-weight stable from last physical- feels cutting donw on sugar could help and carbohydrates or portion control.  Wt Readings from Last 3 Encounters:  03/01/23 170 lb 9.6 oz (77.4 kg)  10/25/22 174 lb 6.4 oz (79.1 kg)  09/27/22 169 lb (76.7 kg)  3. Immunizations/screenings/ancillary studies-up to date other than fall flu shot. Declines COVID  Immunization History  Administered Date(s) Administered   Fluad Quad(high Dose 65+) 04/13/2019, 05/15/2021, 06/03/2022   Hepatitis B 04/02/2013   Influenza,inj,Quad PF,6+ Mos 05/29/2018   Influenza-Unspecified 04/02/2013, 05/02/2014, 06/11/2014, 06/18/2015, 06/02/2016, 03/28/2017, 05/03/2019   PFIZER(Purple Top)SARS-COV-2 Vaccination 07/24/2019, 08/15/2019   Pneumococcal Conjugate-13 03/24/2018   Pneumococcal Polysaccharide-23 04/13/2019   Td 05/28/2009, 03/18/2022   Zoster Recombinant(Shingrix) 02/23/2021, 06/12/2021   Zoster, Live 07/27/2016  4. Cervical cancer screening- past age based screening recommendations-previously followed by Dr. Aldona Bar with last Pap 2017 on file- she reports she still has paps though through GYN 5. Breast cancer screening-  breast exam with gynecology and plastic surgery and mammogram -not done due to prophylactic mastectomy with history of breast cancer in 2000-follow [facility] plastic surgery  6. Colon cancer screening - 11/26/2015 with hyperplastic polyp continue q10 year follow-up plan  7. Skin cancer screening-sees dermatology yearly.  advised regular sunscreen use. Denies worrisome, changing, or new skin lesions.  8. Birth control/STD check- postmenopausal/monogamous  9. Osteoporosis screening at 55- see osteopenia section* with DEXA this year showeing 10 year fracture risk over 20% and hip fracture risk over 3% 10. Smoking associated screening -former smoker-quit over 20 years ago.  With 22 pack years. Offered urinalysis - opts out    Status of chronic or acute concerns   #OAB- myrbetriq 25mg  started Dr. Marlou Porch- we continued #Microscopic hematuria S: Patient with 3-6 RBCs per high-power field on 05/02/2019 and referred to urology--largely unremarkable work-up and was found to have a cystocele -Most recently saw Dr. Marlou Porch on 07/14/2021-they discussed some overactive bladder symptoms and discussed Myrbetriq-recommendation was for as needed follow-up only A/P: oab- doing well continue current medications - offered urinalysis as above but declines   #hyperlipidemia S: Medication:Atorvastatin 20 mg twice weekly-targeting LDL at least under 100 but also has prediabetes so we are being cautious Lab Results  Component Value Date   CHOL 201 (H) 09/07/2021   HDL 50.90 09/07/2021   LDLCALC 112 (H) 09/07/2021   LDLDIRECT 94.6 07/05/2014   TRIG 191.0 (H) 09/07/2021   CHOLHDL 4 09/07/2021  A/P: hopefully improved- consider increasing dose if Low Density Lipoprotein (LDL cholesterol) over 100 but this can also increaess diabetes risk some so working on lifestyle also advised   # Low Bone density (formerly osteopenia) S: Last DEXA: 10/25/2022 with right femoral neck and -2.4 in 10-year total fracture risk of 21% and hip fracture risk 4.9% -no recent falls  Calcium: 1200mg  (through diet ok) recommended  Vitamin D: 1000 units a day recommended-taking and taking at time of visit A/P: discussed options with her and she wants to increase her weight bearing exercise between walking and weight lifting. She is going to continue vitamin D and goal calcium 1200mg  through diet.  - we discussed repeating in 2 years and if worsens then reconsidering medicine like fosamax -so thankful no falls recently- one of the biggest things to avoid   # Hyperglycemia/insulin resistance/prediabetes-peak A1c 5.7 in 2016 S:  Medication:none Lab Results  Component Value Date   HGBA1C 5.5 09/07/2021   HGBA1C 5.6 04/13/2019   HGBA1C 5.7 (H) 07/09/2015    A/P: hopefully stable- update a1c today. Continue without meds for now   #restless legs syndrome - gabapentin 300 mg before bedtime for most part   # Depression S: Medication:None    03/01/2023   12:50 PM 09/27/2022    3:36 PM  09/07/2021    7:58 AM  Depression screen PHQ 2/9  Decreased Interest 0 0 0  Down, Depressed, Hopeless 0 1 0  PHQ - 2 Score 0 1 0  Altered sleeping 0  0  Tired, decreased energy 0  0  Change in appetite 0  0  Feeling bad or failure about yourself  0  0  Trouble concentrating 0  0  Moving slowly or fidgety/restless 0  0  Suicidal thoughts 0  0  PHQ-9 Score 0  0  Difficult doing work/chores Not difficult at all  Not difficult at all  A/P: doing well without medications continue to monitor   #Allergic rhinitis-takes Zyrtec and Singulair as well as Nasacort as needed- doing well with these   IBS-ok lately on benefiber   #no herpes increases with regular valtrex 500 mg daily  Recommended follow up: Return in about 1 year (around 02/29/2024) for physical or sooner if needed.Schedule b4 you leave. Future Appointments  Date Time Provider Department Center  10/10/2023  3:30 PM [facility] ANNUAL [facility]LL[facility] VISIT 1 [facility]   Lab/Order associations: fasting   ICD-10-CM   1. Routine general medical examination at a health care facility  Z00.00     2. Hyperlipidemia, unspecified hyperlipidemia type  E78.5     3. Hyperglycemia  R73.9       No orders of the defined types were placed in this encounter.   Return precautions advised.  Tana Conch, MD

## 2023-03-21 ENCOUNTER — Encounter: Payer: Self-pay | Admitting: Family Medicine

## 2023-03-21 ENCOUNTER — Ambulatory Visit (INDEPENDENT_AMBULATORY_CARE_PROVIDER_SITE_OTHER): Payer: Medicare Other | Admitting: Family Medicine

## 2023-03-21 VITALS — BP 104/62 | HR 97 | Temp 97.7°F | Ht 61.0 in | Wt 173.0 lb

## 2023-03-21 DIAGNOSIS — R040 Epistaxis: Secondary | ICD-10-CM

## 2023-03-21 DIAGNOSIS — E785 Hyperlipidemia, unspecified: Secondary | ICD-10-CM | POA: Diagnosis not present

## 2023-03-21 MED ORDER — ATORVASTATIN CALCIUM 20 MG PO TABS: ORAL_TABLET | ORAL | 3 refills | Status: DC

## 2023-03-21 NOTE — Patient Instructions (Addendum)
Lets permanently discontinue the Nasacort- great job stopping this on your part  nosebleeds likely related to nasacort- already improved since stopping- we discussed if recurrent issues could refer to ENT. Try to avoid blowing nose- there is a spot on the nasal septum that appears irritated- discussed this could reopen with too much manipulation   Recommended follow up: Return for next already scheduled visit or sooner if needed.

## 2023-03-21 NOTE — Progress Notes (Signed)
Phone [phone] In person visit   Subjective:   Nicole Dawson is a 72 y.o. year old very pleasant female patient who presents for/with See problem oriented charting Chief Complaint  Patient presents with   Epistaxis    Pt c/o bad and frequent nosebleeds that's been going on for a few months after using nasal spray   Past Medical History-  Patient Active Problem List   Diagnosis Date Noted   Osteopenia 01/19/2018    Priority: High   Hyperglycemia 04/13/2019    Priority: Medium    IBS (irritable bowel syndrome) 07/08/2015    Priority: Medium    Restless leg syndrome 07/05/2014    Priority: Medium    Hyperlipidemia 05/28/2009    Priority: Medium    BREAST CANCER, HX OF 03/11/2009    Priority: Medium    Obesity (BMI 30.0-34.9) 12/08/2017    Priority: Low   Sigmoid diverticulitis 07/08/2015    Priority: Low   Hx of tobacco use, presenting hazards to health 07/08/2015    Priority: Low   Wheezing 05/30/2013    Priority: Low   TRICUSPID REGURGITATION 05/28/2009    Priority: Low   DEPRESSION 03/11/2009    Priority: Low   Allergic rhinitis 03/11/2009    Priority: Low   HEART MURMUR, HX OF 03/11/2009    Priority: Low   History of herpes genitalis 03/11/2009    Priority: Low   Right rotator cuff tear 10/18/2019    Priority: 1.   Arthritis of right acromioclavicular joint 09/05/2019    Priority: 1.   Subacromial bursitis of right shoulder joint 04/10/2019    Priority: 1.   Greater trochanteric bursitis of left hip 05/10/2016    Priority: 1.   Low back pain 08/03/2012    Priority: 1.    Medications- reviewed and updated Current Outpatient Medications  Medication Sig Dispense Refill   aspirin EC 81 MG tablet Take 81 mg by mouth at bedtime.      Bioflavonoid Products (ESTER C PO) Take 1 tablet by mouth 2 (two) times daily.     Biotin 09811 MCG TABS Take 1 tablet by mouth daily.     Carboxymethylcellul-Glycerin (LUBRICATING EYE DROPS OP) Apply 1 drop to eye as  needed (dry eyes).      cetirizine (ZYRTEC) 10 MG tablet Take 10 mg by mouth daily as needed for allergies.     cholecalciferol (VITAMIN D) 1000 units tablet Take 1,000 Units by mouth 2 (two) times daily at 8 am and 10 pm.     Coenzyme Q10 (COQ-10 PO) Take 1 capsule by mouth daily in the afternoon.     cyanocobalamin 1000 MCG tablet Take 1,000 mcg by mouth daily.     gabapentin (NEURONTIN) 100 MG capsule Take 1 capsule (100 mg total) by mouth 3 (three) times daily. 270 capsule 3   ketotifen (ZADITOR) 0.025 % ophthalmic solution Place 1 drop into both eyes daily.     mirabegron ER (MYRBETRIQ) 25 MG TB24 tablet Take 1 tablet (25 mg total) by mouth daily. 90 tablet 3   montelukast (SINGULAIR) 10 MG tablet Take 10 mg by mouth at bedtime.     Multiple Vitamin (MULTIVITAMIN) tablet Take 1 tablet by mouth daily.     nitroGLYCERIN (NITRO-DUR) 0.1 mg/hr patch 1/4 patch daily 30 patch 12   OVER THE COUNTER MEDICATION Caltrate     valACYclovir (VALTREX) 500 MG tablet Take 500 mg by mouth at bedtime.      atorvastatin (LIPITOR) 20 MG tablet TAKE  1 TABLET BY MOUTH THREE TIMES WEEKLY (Monday, Wednesday, Friday) 40 tablet 3   No current facility-administered medications for this visit.     Objective:  BP 104/62   Pulse 97   Temp 97.7 F (36.5 C)   Ht 5\' 1"  (1.549 m)   Wt 173 lb (78.5 kg)   SpO2 95%   BMI 32.69 kg/m  Gen: NAD, resting comfortably Nasal turbinate appears somewhat friable on the right side and on the left side there is a very small scabbed over area-potential source of prior bleed.  Nasal turbinates are normal. CV: RRR no murmurs rubs or gallops Lungs: CTAB no crackles, wheeze, rhonchi Ext: no edema Skin: warm, dry     Assessment and Plan   # Epistaxis S: Patient complains of frequent nosebleeds for several months- she states this was happening at physical in late July but not very frequently- but has picked up since that time. At physical,  She was using a nasal spray-  Nasacort at July visit. She tried it last week and almost immediately after use noted nosebleed so has stopped.  -no dizziness, lightheadedness, new headache pattern A/P: nosebleeds likely related to nasacort- already improved since stopping- we discussed if recurrent issues could refer to ENT. Try to avoid blowing nose- there is a spot on the nasal septum that appears irritated- discussed this could reopen with too much manipulation  -Offered CBC but she declines-I doubt substantial blood loss anywhere   #hyperlipidemia S: Medication:Atorvastatin 20 mg twice weekly-targeting LDL at least under 100 but also has prediabetes so we are being cautious-A1c at 5.7 years ago -Patient asks about dementia risk with statin-we discussed these are on the low side-would stop statin if issues Lab Results  Component Value Date   CHOL 186 03/01/2023   HDL 57.60 03/01/2023   LDLCALC 109 (H) 03/01/2023   LDLDIRECT 94.6 07/05/2014   TRIG 97.0 03/01/2023   CHOLHDL 3 03/01/2023    A/P: Hyperlipidemia poorly controlled with LDL over 100-she is agreeable to increasing her dose to 3 times a week after discussion today so we will be on atorvastatin 20 mg 3days a week-with memory loss concern we discussed switching to rosuvastatin but she declines   Recommended follow up: Return for next already scheduled visit or sooner if needed. Future Appointments  Date Time Provider Department Center  10/10/2023  3:30 PM [facility] [place] VISIT 1 [facility]  03/01/2024  1:00 PM Shelva Majestic, MD [facility]   Lab/Order associations:   ICD-10-CM   1. Epistaxis  R04.0     2. Hyperlipidemia, unspecified hyperlipidemia type  E78.5      Meds ordered this encounter  Medications   atorvastatin (LIPITOR) 20 MG tablet    Sig: TAKE 1 TABLET BY MOUTH THREE TIMES WEEKLY (Monday, Wednesday, Friday)    Dispense:  40 tablet    Refill:  3    Return precautions advised.  Tana Conch, MD

## 2023-05-05 ENCOUNTER — Telehealth: Payer: Self-pay | Admitting: Family Medicine

## 2023-05-05 NOTE — Telephone Encounter (Signed)
Pt states she needs the refill for  gabapentin (NEURONTIN) 100 MG capsule  To be more than 270 because she takes 3 at bedtime but sometimes she takes one during the day and she runs out before she can refill her RX. Please advise.

## 2023-05-06 MED ORDER — GABAPENTIN 100 MG PO CAPS: 100.0000 mg | ORAL_CAPSULE | Freq: Three times a day (TID) | ORAL | 3 refills | Status: DC

## 2023-05-06 NOTE — Telephone Encounter (Signed)
Qty increased to #340.

## 2023-05-06 NOTE — Telephone Encounter (Signed)
See below, ok to increase qty?

## 2023-06-01 ENCOUNTER — Ambulatory Visit (INDEPENDENT_AMBULATORY_CARE_PROVIDER_SITE_OTHER): Payer: Medicare Other | Admitting: Family Medicine

## 2023-06-01 ENCOUNTER — Encounter: Payer: Self-pay | Admitting: Family Medicine

## 2023-06-01 VITALS — BP 115/71 | HR 91 | Temp 98.3°F | Resp 16 | Ht 61.0 in | Wt 169.1 lb

## 2023-06-01 DIAGNOSIS — K5792 Diverticulitis of intestine, part unspecified, without perforation or abscess without bleeding: Secondary | ICD-10-CM

## 2023-06-01 MED ORDER — AMOXICILLIN-POT CLAVULANATE 875-125 MG PO TABS: 1.0000 | ORAL_TABLET | Freq: Two times a day (BID) | ORAL | 0 refills | Status: DC

## 2023-06-01 NOTE — Progress Notes (Signed)
Subjective:     Patient ID: Nicole Dawson, female    DOB: Dec 14, 1950, 72 y.o.   MRN: [id number]  Chief Complaint  Patient presents with   Diarrhea    Loose stool off and on started Thursday or Friday, soft stools    HPI Loose stools - Patient complains of intermittent loose stool since Thursday/Friday of last week. After her first meal of the day, she immediately had soft stools. Reports that yesterday morning, she did not make it to the bathroom in enough time. Endorses cramping, denies fevers , changes in medications, changes in her diet, or blood in stools. Reports she is having 1 loose bowel movement daily usually after breakfast. History of IBS, mostly constipation. Regularly consumes dairy and milk.  Long h/o diverticulitis-hosp in past  There are no preventive care reminders to display for this patient.  Past Medical History:  Diagnosis Date   Allergy    Breast cancer (HCC)    Depression    Diverticulitis    Diverticulosis    Genital herpes    Heart murmur    HLD (hyperlipidemia)    Hx of tobacco use, presenting hazards to health 07/08/2015   22 years at 1 PPD   IBS (irritable bowel syndrome) 07/08/2015   Diagnosis by Dr. Lendell Dawson her prior PCP   PONV (postoperative nausea and vomiting)    Sigmoid diverticulitis 07/08/2015    Past Surgical History:  Procedure Laterality Date   APPENDECTOMY  1962   BILATERAL TOTAL MASTECTOMY WITH AXILLARY LYMPH NODE DISSECTION  2005   bilateral mastectomy   BREAST RECONSTRUCTION  2005   TUBAL LIGATION  1984     Current Outpatient Medications:    amoxicillin-clavulanate (AUGMENTIN) 875-125 MG tablet, Take 1 tablet by mouth 2 (two) times daily., Disp: 20 tablet, Rfl: 0   aspirin EC 81 MG tablet, Take 81 mg by mouth at bedtime. , Disp: , Rfl:    atorvastatin (LIPITOR) 20 MG tablet, TAKE 1 TABLET BY MOUTH THREE TIMES WEEKLY (Monday, Wednesday, Friday), Disp: 40 tablet, Rfl: 3   Bioflavonoid Products (ESTER C PO), Take 1 tablet by  mouth 2 (two) times daily., Disp: , Rfl:    Biotin 29528 MCG TABS, Take 1 tablet by mouth daily., Disp: , Rfl:    Carboxymethylcellul-Glycerin (LUBRICATING EYE DROPS OP), Apply 1 drop to eye as needed (dry eyes). , Disp: , Rfl:    cetirizine (ZYRTEC) 10 MG tablet, Take 10 mg by mouth daily as needed for allergies., Disp: , Rfl:    cholecalciferol (VITAMIN D) 1000 units tablet, Take 1,000 Units by mouth 2 (two) times daily at 8 am and 10 pm., Disp: , Rfl:    Coenzyme Q10 (COQ-10 PO), Take 1 capsule by mouth daily in the afternoon., Disp: , Rfl:    cyanocobalamin 1000 MCG tablet, Take 1,000 mcg by mouth daily., Disp: , Rfl:    Doxylamine Succinate, Sleep, (SLEEP AID PO), Take by mouth., Disp: , Rfl:    gabapentin (NEURONTIN) 100 MG capsule, Take 1 capsule (100 mg total) by mouth 3 (three) times daily., Disp: 340 capsule, Rfl: 3   ketotifen (ZADITOR) 0.025 % ophthalmic solution, Place 1 drop into both eyes daily., Disp: , Rfl:    mirabegron ER (MYRBETRIQ) 25 MG TB24 tablet, Take 1 tablet (25 mg total) by mouth daily., Disp: 90 tablet, Rfl: 3   montelukast (SINGULAIR) 10 MG tablet, Take 10 mg by mouth at bedtime., Disp: , Rfl:    Multiple Vitamin (MULTIVITAMIN) tablet, Take 1  tablet by mouth daily., Disp: , Rfl:    nitroGLYCERIN (NITRO-DUR) 0.1 mg/hr patch, 1/4 patch daily, Disp: 30 patch, Rfl: 12   OVER THE COUNTER MEDICATION, Caltrate, Disp: , Rfl:    valACYclovir (VALTREX) 500 MG tablet, Take 500 mg by mouth at bedtime. , Disp: , Rfl:   Allergies  Allergen Reactions   Tape Rash   Dust Mite Extract Cough   Molds & Smuts Cough   ROS neg/noncontributory except as noted HPI/below      Objective:     BP 115/71   Pulse 91   Temp 98.3 F (36.8 C) (Temporal)   Resp 16   Ht 5\' 1"  (1.549 m)   Wt 169 lb 2 oz (76.7 kg)   SpO2 94%   BMI 31.96 kg/m  Wt Readings from Last 3 Encounters:  06/01/23 169 lb 2 oz (76.7 kg)  03/21/23 173 lb (78.5 kg)  03/01/23 170 lb 9.6 oz (77.4 kg)     Physical Exam   Gen: WDWN NAD HEENT: NCAT, conjunctiva not injected, sclera nonicteric CARDIAC: RRR, S1S2+, no murmur. DP 2+B LUNGS: CTAB. No wheezes ABDOMEN:  BS+, soft, NTND, No HSM, no masses +TTP right lower and upper quadrant .  No cvat EXT:  no edema MSK: no gross abnormalities.  NEURO: A&O x3.  CN II-XII intact.  PSYCH: normal mood. Good eye contact     Assessment & Plan:  Diverticulitis  Other orders -     Amoxicillin-Pot Clavulanate; Take 1 tablet by mouth 2 (two) times daily.  Dispense: 20 tablet; Refill: 0   Loose stools-suspect diverticulitis as TTP on R(usu has on L).  Will tx w/augmentin 875 bid.  Worse, ER.  Could be viral, food related, other, but given pain and h/o, will tx.   Return if symptoms worsen or fail to improve.  Nicole Dawson,acting as a scribe for Nicole Sole, MD.,have documented all relevant documentation on the behalf of Nicole Sole, MD,as directed by  Nicole Sole, MD while in the presence of Nicole Sole, MD.  Nicole Dawson, have reviewed all documentation for this visit. The documentation on 06/01/23 for the exam, diagnosis, procedures, and orders are all accurate and complete.   Nicole Dawson

## 2023-06-01 NOTE — Patient Instructions (Signed)
Er if new symptoms, worse, etc.   Consider probiotics.

## 2023-06-15 ENCOUNTER — Ambulatory Visit: Payer: Medicare Other | Admitting: Family Medicine

## 2023-06-15 ENCOUNTER — Ambulatory Visit: Payer: Medicare Other | Admitting: Family

## 2023-06-15 ENCOUNTER — Encounter: Payer: Self-pay | Admitting: Family

## 2023-06-15 VITALS — BP 107/58 | HR 65 | Temp 98.0°F | Ht 61.0 in | Wt 170.0 lb

## 2023-06-15 DIAGNOSIS — R195 Other fecal abnormalities: Secondary | ICD-10-CM

## 2023-06-15 DIAGNOSIS — K644 Residual hemorrhoidal skin tags: Secondary | ICD-10-CM

## 2023-06-15 DIAGNOSIS — K582 Mixed irritable bowel syndrome: Secondary | ICD-10-CM

## 2023-06-15 NOTE — Progress Notes (Signed)
Patient ID: Nicole Dawson, female    DOB: 07-16-1951, 72 y.o.   MRN: [id number]  Chief Complaint  Patient presents with   Diarrhea    Pt c/o diarrhea, present for 3 weeks. Has tried  Amoxicillin-Pot Clavulanate from 10/30 but pt is still having loose stools.    Discussed the use of AI scribe software for clinical note transcription with the patient, who gave verbal consent to proceed.  History of Present Illness   The patient, with a history of diverticulitis and IBS with constipation, presents with loose stools and abdominal pain. She was treated with a course of Augmentin, suspecting a recurrence of diverticulitis. The antibiotic treatment improved her symptoms, but she continues to have loose stools, which she attributes to her IBS. The frequency of bowel movements varies, with two to three episodes reported the previous day and none today so far. The stools are described as loose but not watery, and there is no reported mucus or blood. However, the patient does report bleeding and irritation from external hemorrhoids. She has not identified specific dietary triggers for her IBS symptoms and has not been on any regular medication for it. She has been managing her constipation with fiber.     Assessment & Plan:     Diverticulitis - Recent episode treated with Augmentin, completed on the previous Friday. Pain improved, but loose stools persist. No fever or other signs of ongoing infection. Abdominal exam wnl. -Continue monitoring symptoms. -Monitor symptoms and report any worsening or lack of improvement.  Irritable Bowel Syndrome (IBS) - History of constipation-predominant IBS, currently experiencing loose stools. No specific dietary triggers identified. -Consider starting a probiotic (Restora) to restore gut microbiome, pt given sample today for 5d. -Monitor symptoms and report any worsening or lack of improvement.  External hemorrhoids - Complaints of bleeding and  irritation. -Recommended over-the-counter steroid cream and Tucks wipes for symptom management.  -Notify office if sx are not improving.    Subjective:    Outpatient Medications Prior to Visit  Medication Sig Dispense Refill   aspirin EC 81 MG tablet Take 81 mg by mouth at bedtime.      atorvastatin (LIPITOR) 20 MG tablet TAKE 1 TABLET BY MOUTH THREE TIMES WEEKLY (Monday, Wednesday, Friday) 40 tablet 3   Bioflavonoid Products (ESTER C PO) Take 1 tablet by mouth 2 (two) times daily.     Biotin 40981 MCG TABS Take 1 tablet by mouth daily.     Carboxymethylcellul-Glycerin (LUBRICATING EYE DROPS OP) Apply 1 drop to eye as needed (dry eyes).      cetirizine (ZYRTEC) 10 MG tablet Take 10 mg by mouth daily as needed for allergies.     cholecalciferol (VITAMIN D) 1000 units tablet Take 1,000 Units by mouth 2 (two) times daily at 8 am and 10 pm.     Coenzyme Q10 (COQ-10 PO) Take 1 capsule by mouth daily in the afternoon.     gabapentin (NEURONTIN) 100 MG capsule Take 1 capsule (100 mg total) by mouth 3 (three) times daily. 340 capsule 3   mirabegron ER (MYRBETRIQ) 25 MG TB24 tablet Take 1 tablet (25 mg total) by mouth daily. 90 tablet 3   montelukast (SINGULAIR) 10 MG tablet Take 10 mg by mouth at bedtime.     Multiple Vitamin (MULTIVITAMIN) tablet Take 1 tablet by mouth daily.     nitroGLYCERIN (NITRO-DUR) 0.1 mg/hr patch 1/4 patch daily 30 patch 12   OVER THE COUNTER MEDICATION Caltrate     valACYclovir (VALTREX) 500  MG tablet Take 500 mg by mouth at bedtime.      amoxicillin-clavulanate (AUGMENTIN) 875-125 MG tablet Take 1 tablet by mouth 2 (two) times daily. 20 tablet 0   cyanocobalamin 1000 MCG tablet Take 1,000 mcg by mouth daily.     Doxylamine Succinate, Sleep, (SLEEP AID PO) Take by mouth.     ketotifen (ZADITOR) 0.025 % ophthalmic solution Place 1 drop into both eyes daily.     No facility-administered medications prior to visit.   Past Medical History:  Diagnosis Date   Allergy     Breast cancer (HCC)    Depression    Diverticulitis    Diverticulosis    Genital herpes    Heart murmur    HLD (hyperlipidemia)    Hx of tobacco use, presenting hazards to health 07/08/2015   22 years at 1 PPD   IBS (irritable bowel syndrome) 07/08/2015   Diagnosis by Dr. Lendell Caprice her prior PCP   PONV (postoperative nausea and vomiting)    Sigmoid diverticulitis 07/08/2015   Past Surgical History:  Procedure Laterality Date   APPENDECTOMY  1962   BILATERAL TOTAL MASTECTOMY WITH AXILLARY LYMPH NODE DISSECTION  2005   bilateral mastectomy   BREAST RECONSTRUCTION  2005   TUBAL LIGATION  1984   Allergies  Allergen Reactions   Tape Rash   Dust Mite Extract Cough   Molds & Smuts Cough      Objective:    Physical Exam Vitals and nursing note reviewed.  Constitutional:      Appearance: Normal appearance.  Cardiovascular:     Rate and Rhythm: Normal rate and regular rhythm.  Pulmonary:     Effort: Pulmonary effort is normal.     Breath sounds: Normal breath sounds.  Abdominal:     General: Abdomen is protuberant. There is no distension.     Palpations: Abdomen is soft.     Tenderness: There is no abdominal tenderness. There is no guarding or rebound.  Musculoskeletal:        General: Normal range of motion.  Skin:    General: Skin is warm and dry.  Neurological:     Mental Status: She is alert.  Psychiatric:        Mood and Affect: Mood normal.        Behavior: Behavior normal.    BP (!) 107/58 (BP Location: Left Arm, Patient Position: Sitting, Cuff Size: Large)   Pulse 65   Temp 98 F (36.7 C) (Temporal)   Ht 5\' 1"  (1.549 m)   Wt 170 lb (77.1 kg)   SpO2 98%   BMI 32.12 kg/m  Wt Readings from Last 3 Encounters:  06/15/23 170 lb (77.1 kg)  06/01/23 169 lb 2 oz (76.7 kg)  03/21/23 173 lb (78.5 kg)      Dulce Sellar, NP

## 2023-06-27 DIAGNOSIS — Z421 Encounter for breast reconstruction following mastectomy: Secondary | ICD-10-CM | POA: Diagnosis not present

## 2023-08-18 DIAGNOSIS — L2989 Other pruritus: Secondary | ICD-10-CM | POA: Diagnosis not present

## 2023-08-18 DIAGNOSIS — L538 Other specified erythematous conditions: Secondary | ICD-10-CM | POA: Diagnosis not present

## 2023-08-18 DIAGNOSIS — L814 Other melanin hyperpigmentation: Secondary | ICD-10-CM | POA: Diagnosis not present

## 2023-08-18 DIAGNOSIS — D485 Neoplasm of uncertain behavior of skin: Secondary | ICD-10-CM | POA: Diagnosis not present

## 2023-08-18 DIAGNOSIS — L820 Inflamed seborrheic keratosis: Secondary | ICD-10-CM | POA: Diagnosis not present

## 2023-08-18 DIAGNOSIS — L821 Other seborrheic keratosis: Secondary | ICD-10-CM | POA: Diagnosis not present

## 2023-08-18 DIAGNOSIS — Z789 Other specified health status: Secondary | ICD-10-CM | POA: Diagnosis not present

## 2023-08-18 DIAGNOSIS — D2372 Other benign neoplasm of skin of left lower limb, including hip: Secondary | ICD-10-CM | POA: Diagnosis not present

## 2023-08-31 DIAGNOSIS — D0471 Carcinoma in situ of skin of right lower limb, including hip: Secondary | ICD-10-CM | POA: Diagnosis not present

## 2023-10-03 ENCOUNTER — Ambulatory Visit: Payer: Self-pay | Admitting: Family Medicine

## 2023-10-03 NOTE — Telephone Encounter (Signed)
  Chief Complaint: dizziness Symptoms: spinning/tilting Frequency: started Saturday Pertinent Negatives: Patient denies fever, chest pain, vomiting, diarrhea, bleeding Disposition: [] ED /[] Urgent Care (no appt availability in office) / [x] Appointment(In office/virtual)/ []  [facility]/ [] Home Care/ [] Refused Recommended Disposition /[] [facility]/ []  Follow-up with PCP Additional Notes: Appointment scheduled for evaluation   Copied from CRM [id number]. Topic: Clinical - Red Word Triage >> Oct 03, 2023  8:42 AM Deaijah H wrote: Red Word that prompted transfer to Nurse Triage: Dizziness (not the same all the time) Reason for Disposition  [1] MILD dizziness (e.g., vertigo; walking normally) AND [2] has NOT been evaluated by doctor (or NP/PA) for this  Answer Assessment - Initial Assessment Questions 1. DESCRIPTION: "Describe your dizziness."     Patient may have infected her sinus with changing her Nettie pot water 2. LIGHTHEADED: "Do you feel lightheaded?" (e.g., somewhat faint, woozy, weak upon standing)     Spinning, nausea 3. VERTIGO: "Do you feel like either you or the room is spinning or tilting?" (i.e. vertigo)     yes 4. SEVERITY: "How bad is it?"  "Do you feel like you are going to faint?" "Can you stand and walk?"   - MILD: Feels slightly dizzy, but walking normally.   - MODERATE: Feels unsteady when walking, but not falling; interferes with normal activities (e.g., school, work).   - SEVERE: Unable to walk without falling, or requires assistance to walk without falling; feels like passing out now.      mild 5. ONSET:  "When did the dizziness begin?"     Saturday 6. AGGRAVATING FACTORS: "Does anything make it worse?" (e.g., standing, change in head position)     Changing- raising up from laying down, look up- head 7. HEART RATE: "Can you tell me your heart rate?" "How many beats in 15 seconds?"  (Note: not all patients can do this)       no 8. CAUSE: "What do  you think is causing the dizziness?"     unsure 9. RECURRENT SYMPTOM: "Have you had dizziness before?" If Yes, ask: "When was the last time?" "What happened that time?"     Yes- ran out of medication  10. OTHER SYMPTOMS: "Do you have any other symptoms?" (e.g., fever, chest pain, vomiting, diarrhea, bleeding)       no  Protocols used: Dizziness - Lightheadedness-A-AH, Dizziness - Vertigo-A-AH

## 2023-10-04 ENCOUNTER — Ambulatory Visit (INDEPENDENT_AMBULATORY_CARE_PROVIDER_SITE_OTHER): Admitting: Family Medicine

## 2023-10-04 ENCOUNTER — Encounter: Payer: Self-pay | Admitting: Family Medicine

## 2023-10-04 VITALS — BP 108/69 | HR 85 | Temp 97.2°F | Ht 61.0 in | Wt 164.2 lb

## 2023-10-04 DIAGNOSIS — H8110 Benign paroxysmal vertigo, unspecified ear: Secondary | ICD-10-CM | POA: Diagnosis not present

## 2023-10-04 DIAGNOSIS — J301 Allergic rhinitis due to pollen: Secondary | ICD-10-CM

## 2023-10-04 MED ORDER — MECLIZINE HCL 25 MG PO TABS: 25.0000 mg | ORAL_TABLET | Freq: Three times a day (TID) | ORAL | 0 refills | Status: DC | PRN

## 2023-10-04 NOTE — Patient Instructions (Signed)
 It was very nice to see you today!  You have BPPV.  Please start the meclizine.  Let us know if not improving or if you have any worsening symptoms.  Return if symptoms worsen or fail to improve.   Take care, Dr Jimmey Ralph  PLEASE NOTE:  If you had any lab tests, please let us know if you have not heard back within a few days. You may see your results on mychart before we have a chance to review them but we will give you a call once they are reviewed by [place].   If we ordered any referrals today, please let us know if you have not heard from their office within the next week.   If you had any urgent prescriptions sent in today, please check with the pharmacy within an hour of our visit to make sure the prescription was transmitted appropriately.   Please try these tips to maintain a healthy lifestyle:  Eat at least 3 REAL meals and 1-2 snacks per day.  Aim for no more than 5 hours between eating.  If you eat breakfast, please do so within one hour of getting up.   Each meal should contain half fruits/vegetables, one quarter protein, and one quarter carbs (no bigger than a computer mouse)  Cut down on sweet beverages. This includes juice, soda, and sweet tea.   Drink at least 1 glass of water with each meal and aim for at least 8 glasses per day  Exercise at least 150 minutes every week.    fBenign Positional Vertigo Vertigo is the feeling that you or your surroundings are moving when they are not. Benign positional vertigo is the most common form of vertigo. This is usually a harmless condition (benign). This condition is positional. This means that symptoms are triggered by certain movements and positions. This condition can be dangerous if it occurs while you are doing something that could cause harm to yourself or others. This includes activities such as driving or operating machinery. What are the causes? The inner ear has fluid-filled canals that help your brain sense movement and  balance. When the fluid moves, the brain receives messages about your body's position. With benign positional vertigo, calcium crystals in the inner ear break free and disturb the inner ear area. This causes your brain to receive confusing messages about your body's position. What increases the risk? You are more likely to develop this condition if: You are a woman. You are 55 years of age or older. You have recently had a head injury. You have an inner ear disease. What are the signs or symptoms? Symptoms of this condition usually happen when you move your head or your eyes in different directions. Symptoms may start suddenly and usually last for less than a minute. They include: Loss of balance and falling. Feeling like you are spinning or moving. Feeling like your surroundings are spinning or moving. Nausea and vomiting. Blurred vision. Dizziness. Involuntary eye movement (nystagmus). Symptoms can be mild and cause only minor problems, or they can be severe and interfere with daily life. Episodes of benign positional vertigo may return (recur) over time. Symptoms may also improve over time. How is this diagnosed? This condition may be diagnosed based on: Your medical history. A physical exam of the head, neck, and ears. Positional tests to check for or stimulate vertigo. You may be asked to turn your head and change positions, such as going from sitting to lying down. A health care provider  will watch for symptoms of vertigo. You may be referred to a health care provider who specializes in ear, nose, and throat problems (ENT or otolaryngologist) or a provider who specializes in disorders of the nervous system (neurologist). How is this treated?  This condition may be treated in a session in which your health care provider moves your head in specific positions to help the displaced crystals in your inner ear move. Treatment for this condition may take several sessions. Surgery may be  needed in severe cases, but this is rare. In some cases, benign positional vertigo may resolve on its own in 2-4 weeks. Follow these instructions at home: Safety Move slowly. Avoid sudden body or head movements or certain positions, as told by your health care provider. Avoid driving or operating machinery until your health care provider says it is safe. Avoid doing any tasks that would be dangerous to you or others if vertigo occurs. If you have trouble walking or keeping your balance, try using a cane for stability. If you feel dizzy or unstable, sit down right away. Return to your normal activities as told by your health care provider. Ask your health care provider what activities are safe for you. General instructions Take over-the-counter and prescription medicines only as told by your health care provider. Drink enough fluid to keep your urine pale yellow. Keep all follow-up visits. This is important. Contact a health care provider if: You have a fever. Your condition gets worse or you develop new symptoms. Your family or friends notice any behavioral changes. You have nausea or vomiting that gets worse. You have numbness or a prickling and tingling sensation. Get help right away if you: Have difficulty speaking or moving. Are always dizzy or faint. Develop severe headaches. Have weakness in your legs or arms. Have changes in your hearing or vision. Develop a stiff neck. Develop sensitivity to light. These symptoms may represent a serious problem that is an emergency. Do not wait to see if the symptoms will go away. Get medical help right away. Call your local emergency services (911 in the [place] Do not drive yourself to the hospital. Summary Vertigo is the feeling that you or your surroundings are moving when they are not. Benign positional vertigo is the most common form of vertigo. This condition is caused by calcium crystals in the inner ear that become displaced. This causes  a disturbance in an area of the inner ear that helps your brain sense movement and balance. Symptoms include loss of balance and falling, feeling that you or your surroundings are moving, nausea and vomiting, and blurred vision. This condition can be diagnosed based on symptoms, a physical exam, and positional tests. Follow safety instructions as told by your health care provider and keep all follow-up visits. This is important. This information is not intended to replace advice given to you by your health care provider. Make sure you discuss any questions you have with your health care provider. Document Revised: 02/07/2023 Document Reviewed: 02/07/2023 Elsevier Patient Education  2024 [employer].

## 2023-10-04 NOTE — Progress Notes (Signed)
   Nicole Dawson is a 73 y.o. female who presents today for an office visit.  Assessment/Plan:  New/Acute Problems: Vertigo Likely BPPV given her positional nature and overall reassuring exam today.  No other red flag signs or symptoms.  Do not think we need to obtain imaging today.  Will start meclizine as this worked well for her last year when she had similar symptoms.  We did discuss referral to ENT or vestibular rehab however she declined.  We discussed reasons to return to care and seek emergent care.  If she has further recurrences or symptoms do not improve with meclizine we can refer at that time.  Chronic Problems Addressed Today: Allergic rhinitis She does have some signs of nasal congestion today however no other signs or symptoms concerning for infection-do not think she needs any antibiotics at this point.  She can continue with her home allergy meds.  She will let us know if she has any change in symptoms.    Subjective:  HPI:  Patient here with dizziness for the last 3 days. Symptoms come and go.  Worse when moving her head. She has had more sinus congestion the last several days as well. She has been using a netti pot with distilled water and she accidentally used water from a cup that she was drinking from. Dizziness described as a spinning sensation.  She has no symptoms at rest.  Worse when leaning head to the right into the back.  No weakness or numbness. She had something similar about a year ago that improved with meclizine.  No fevers or chills.  No headache.       Objective:  Physical Exam: BP 108/69   Pulse 85   Temp (!) 97.2 F (36.2 C) (Temporal)   Ht 5\' 1"  (1.549 m)   Wt 164 lb 3.2 oz (74.5 kg)   SpO2 96%   BMI 31.03 kg/m   Gen: No acute distress, resting comfortably HEENT: Bilateral TMs with clear effusion.  OP clear.  Nose mucosa erythematous and boggy bilaterally. CV: Regular rate and rhythm with no murmurs appreciated Pulm: Normal work of  breathing, clear to auscultation bilaterally with no crackles, wheezes, or rhonchi Neuro: Cranial nerves II through XII intact.  Finger-nose-finger testing intact bilaterally.  Dix-Hallpike deferred. Psych: Normal affect and thought content      Avilyn Virtue M. Jimmey Ralph, MD 10/04/2023 10:51 AM

## 2023-10-10 ENCOUNTER — Ambulatory Visit (INDEPENDENT_AMBULATORY_CARE_PROVIDER_SITE_OTHER): Payer: Medicare Other

## 2023-10-10 VITALS — Ht 62.0 in | Wt 164.0 lb

## 2023-10-10 DIAGNOSIS — Z0000 Encounter for general adult medical examination without abnormal findings: Secondary | ICD-10-CM

## 2023-10-10 NOTE — Progress Notes (Signed)
 Subjective:   Nicole Dawson is a 73 y.o. who presents for a Medicare Wellness preventive visit.  Visit Complete: Virtual I connected with  Hilarie Fredrickson on 10/10/23 by a audio enabled telemedicine application and verified that I am speaking with the correct person using two identifiers.  Patient Location: Home  Provider Location: Office/Clinic  I discussed the limitations of evaluation and management by telemedicine. The patient expressed understanding and agreed to proceed.  Vital Signs: Because this visit was a virtual/telehealth visit, some criteria may be missing or patient reported. Any vitals not documented were not able to be obtained and vitals that have been documented are patient reported.  VideoDeclined- This patient declined Librarian, academic. Therefore the visit was completed with audio only.  AWV Questionnaire: No: Patient Medicare AWV questionnaire was not completed prior to this visit.  Cardiac Risk Factors include: advanced age (>68men, >11 women);dyslipidemia;obesity (BMI >30kg/m2)     Objective:    Today's Vitals   10/10/23 1549  Weight: 164 lb (74.4 kg)  Height: 5\' 2"  (1.575 m)   Body mass index is 30 kg/m.     10/10/2023    3:57 PM 09/27/2022    3:40 PM 01/07/2021    8:46 AM 07/13/2019    9:52 AM 07/08/2015    5:09 PM  Advanced Directives  Does Patient Have a Medical Advance Directive? No No No Yes No  Type of Advance Directive    Living will;Healthcare Power of Attorney   Does patient want to make changes to medical advance directive?    No - Patient declined   Copy of Healthcare Power of Attorney in Chart?    No - copy requested   Would patient like information on creating a medical advance directive? No - Patient declined No - Patient declined Yes (MAU/Ambulatory/Procedural Areas - Information given)  No - patient declined information    Current Medications (verified) Outpatient Encounter Medications as of  10/10/2023  Medication Sig   aspirin EC 81 MG tablet Take 81 mg by mouth at bedtime.    atorvastatin (LIPITOR) 20 MG tablet TAKE 1 TABLET BY MOUTH THREE TIMES WEEKLY (Monday, Wednesday, Friday)   FLUAD 0.5 ML injection    gabapentin (NEURONTIN) 100 MG capsule Take 1 capsule (100 mg total) by mouth 3 (three) times daily.   meclizine (ANTIVERT) 25 MG tablet Take 1 tablet (25 mg total) by mouth 3 (three) times daily as needed for dizziness.   mirabegron ER (MYRBETRIQ) 25 MG TB24 tablet Take 1 tablet (25 mg total) by mouth daily.   montelukast (SINGULAIR) 10 MG tablet Take 10 mg by mouth at bedtime.   nitroGLYCERIN (NITRO-DUR) 0.1 mg/hr patch 1/4 patch daily   valACYclovir (VALTREX) 500 MG tablet Take 500 mg by mouth at bedtime.    Bioflavonoid Products (ESTER C PO) Take 1 tablet by mouth 2 (two) times daily. (Patient not taking: Reported on 10/10/2023)   Biotin 65784 MCG TABS Take 1 tablet by mouth daily. (Patient not taking: Reported on 10/10/2023)   cholecalciferol (VITAMIN D) 1000 units tablet Take 1,000 Units by mouth 2 (two) times daily at 8 am and 10 pm. (Patient not taking: Reported on 10/10/2023)   Coenzyme Q10 (COQ-10 PO) Take 1 capsule by mouth daily in the afternoon. (Patient not taking: Reported on 10/10/2023)   Multiple Vitamin (MULTIVITAMIN) tablet Take 1 tablet by mouth daily. (Patient not taking: Reported on 10/10/2023)   OVER THE COUNTER MEDICATION Caltrate (Patient not taking: Reported on 10/10/2023)   [  DISCONTINUED] Carboxymethylcellul-Glycerin (LUBRICATING EYE DROPS OP) Apply 1 drop to eye as needed (dry eyes).    [DISCONTINUED] cetirizine (ZYRTEC) 10 MG tablet Take 10 mg by mouth daily as needed for allergies.   No facility-administered encounter medications on file as of 10/10/2023.    Allergies (verified) Tape, Dust mite extract, and Molds & smuts   History: Past Medical History:  Diagnosis Date   Allergy    Breast cancer (HCC)    Depression    Diverticulitis     Diverticulosis    Genital herpes    Heart murmur    HLD (hyperlipidemia)    Hx of tobacco use, presenting hazards to health 07/08/2015   22 years at 1 PPD   IBS (irritable bowel syndrome) 07/08/2015   Diagnosis by Dr. Lendell Caprice her prior PCP   PONV (postoperative nausea and vomiting)    Sigmoid diverticulitis 07/08/2015   Past Surgical History:  Procedure Laterality Date   APPENDECTOMY  1962   BILATERAL TOTAL MASTECTOMY WITH AXILLARY LYMPH NODE DISSECTION  2005   bilateral mastectomy   BREAST RECONSTRUCTION  2005   TUBAL LIGATION  1984   Family History  Problem Relation Age of Onset   Hyperlipidemia Mother    Hypertension Mother    COPD Mother    Stroke Mother        in 34s.    Heart attack Father        32.    Tuberculosis Father        1/2 of lung removed.    Hyperlipidemia Father    Lymphoma Maternal Aunt    Skin cancer Paternal Grandmother    Social History   Socioeconomic History   Marital status: Married    Spouse name: Not on file   Number of children: 2   Years of education: Not on file   Highest education level: Associate degree: occupational, Scientist, product/process development, or vocational program  Occupational History   Occupation: CNA  Tobacco Use   Smoking status: Former    Current packs/day: 0.00    Average packs/day: 1 pack/day for 30.0 years (30.0 ttl pk-yrs)    Types: Cigarettes    Start date: 07/07/1962    Quit date: 07/07/1992    Years since quitting: 31.2   Smokeless tobacco: Never  Substance and Sexual Activity   Alcohol use: No   Drug use: No   Sexual activity: Not on file  Other Topics Concern   Not on file  Social History Narrative   Lives in [place], previously lived in [place] until 2020. Married 2007. 2 kids (son and daughter). 2 grandkids (granddaughter and grandson). Both in [place]. Grandson on on the way (son's) in April 2016.       Retired in December 2023- Worked at [employer]- a branch of [employer].    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       Hobbies: computer games, tv   Social Drivers of Health   Financial Resource Strain: Low Risk  (10/10/2023)   Overall Financial Resource Strain (CARDIA)    Difficulty of Paying Living Expenses: Not very hard  Food Insecurity: No Food Insecurity (10/10/2023)   Hunger Vital Sign    Worried About Running Out of Food in the Last Year: Never true    Ran Out of Food in the Last Year: Never true  Transportation Needs: No Transportation Needs (10/10/2023)   PRAPARE - Administrator, Civil Service (Medical): No  Lack of Transportation (Non-Medical): No  Physical Activity: Insufficiently Active (10/10/2023)   Exercise Vital Sign    Days of Exercise per Week: 2 days    Minutes of Exercise per Session: 60 min  Stress: No Stress Concern Present (10/10/2023)   [employer] of Occupational Health - Occupational Stress Questionnaire    Feeling of Stress : Not at all  Social Connections: Moderately Integrated (10/10/2023)   Social Connection and Isolation Panel [NHANES]    Frequency of Communication with Friends and Family: More than three times a week    Frequency of Social Gatherings with Friends and Family: More than three times a week    Attends Religious Services: 1 to 4 times per year    Active Member of [employer] or Organizations: No    Attends Engineer, structural: Never    Marital Status: Married    Tobacco Counseling Counseling given: Not Answered    Clinical Intake:  Pre-visit preparation completed: Yes  Pain : No/denies pain     BMI - recorded: 30 Nutritional Status: BMI > 30  Obese Nutritional Risks: None Diabetes: No  How often do you need to have someone help you when you read instructions, pamphlets, or other written materials from your doctor or pharmacy?: 1 - Never  Interpreter Needed?: No  Information entered by :: Lanier Ensign, LPN   Activities of Daily Living     10/10/2023    3:50 PM  In your  present state of health, do you have any difficulty performing the following activities:  Hearing? 0  Vision? 0  Difficulty concentrating or making decisions? 0  Walking or climbing stairs? 0  Dressing or bathing? 0  Doing errands, shopping? 0  Preparing Food and eating ? N  Using the Toilet? N  In the past six months, have you accidently leaked urine? Y  Comment at times urgency  Do you have problems with loss of bowel control? N  Managing your Medications? N  Managing your Finances? N  Housekeeping or managing your Housekeeping? N    Patient Care Team: Shelva Majestic, MD as PCP - General (Family Medicine) Annamaria Helling, MD as Consulting Physician (Obstetrics and Gynecology) Judi Saa, DO as Consulting Physician (Sports Medicine) [facility], [facility] as Consulting Physician Woodroe Mode, MD as Consulting Physician (Plastic Surgery) Tracie Harrier, MD as Consulting Physician (Surgical Oncology) Eileen Stanford, MD as Consulting Physician (Allergy and Immunology)  Indicate any recent Medical Services you may have received from other than [facility] providers in the past year (date may be approximate).     Assessment:   This is a routine wellness examination for [facility].  Hearing/Vision screen Hearing Screening - Comments:: Pt denies any hearing issues  Vision Screening - Comments:: Pt follows up with Dr Burgess Estelle for annual eye exams    Goals Addressed             This Visit's Progress    Patient Stated       Lose weight        Depression Screen     10/10/2023    3:54 PM 10/04/2023   10:41 AM 06/01/2023    2:25 PM 03/01/2023   12:50 PM 09/27/2022    3:36 PM 09/07/2021    7:58 AM 07/13/2019    9:53 AM  PHQ 2/9 Scores  PHQ - 2 Score 0 0 0 0 1 0 0  PHQ- 9 Score   0 0  0  Fall Risk     10/10/2023    3:58 PM 10/04/2023   10:41 AM 06/01/2023    2:26 PM 03/01/2023   12:49 PM 09/27/2022    3:41 PM  Fall Risk   Falls in the past year? 0 0 0 0 0   Number falls in past yr: 0 0 0 0 0  Injury with Fall? 0 0 0 0 0  Risk for fall due to : Impaired balance/gait No Fall Risks No Fall Risks No Fall Risks Impaired vision  Follow up Falls prevention discussed  Falls evaluation completed Falls evaluation completed Falls prevention discussed    MEDICARE RISK AT HOME:  Medicare Risk at Home Any stairs in or around the home?: Yes If so, are there any without handrails?: Yes Home free of loose throw rugs in walkways, pet beds, electrical cords, etc?: Yes Adequate lighting in your home to reduce risk of falls?: Yes Life alert?: No Use of a cane, walker or w/c?: No Grab bars in the bathroom?: No Shower chair or bench in shower?: No Elevated toilet seat or a handicapped toilet?: No  TIMED UP AND GO:  Was the test performed?  No  Cognitive Function: 6CIT completed        10/10/2023    3:59 PM 09/27/2022    3:42 PM 07/13/2019    9:52 AM  6CIT Screen  What Year? 0 points 0 points 0 points  What month? 0 points 0 points 0 points  What time? 0 points 0 points 0 points  Count back from 20 0 points 0 points 0 points  Months in reverse 0 points 0 points 0 points  Repeat phrase 0 points 0 points 0 points  Total Score 0 points 0 points 0 points    Immunizations Immunization History  Administered Date(s) Administered   Fluad Quad(high Dose 65+) 04/13/2019, 05/15/2021, 06/03/2022   Fluad Trivalent(High Dose 65+) 05/18/2023   Hepatitis B 04/02/2013   Influenza,inj,Quad PF,6+ Mos 05/29/2018   Influenza-Unspecified 04/02/2013, 05/02/2014, 06/11/2014, 06/18/2015, 06/02/2016, 03/28/2017, 05/03/2019   PFIZER(Purple Top)SARS-COV-2 Vaccination 07/24/2019, 08/15/2019   Pneumococcal Conjugate-13 03/24/2018   Pneumococcal Polysaccharide-23 04/13/2019   Td 05/28/2009, 03/18/2022   Tdap 03/10/2022   Zoster Recombinant(Shingrix) 02/23/2021, 06/12/2021   Zoster, Live 11/06/2015, 07/27/2016    Screening Tests Health Maintenance  Topic Date Due    Medicare Annual Wellness (AWV)  10/09/2024   Colonoscopy  11/25/2025   DTaP/Tdap/Td (4 - Td or Tdap) 03/18/2032   Pneumonia Vaccine 25+ Years old  Completed   INFLUENZA VACCINE  Completed   DEXA SCAN  Completed   Hepatitis C Screening  Completed   Zoster Vaccines- Shingrix  Completed   HPV VACCINES  Aged Out   COVID-19 Vaccine  Discontinued    Health Maintenance  There are no preventive care reminders to display for this patient. Health Maintenance Items Addressed: See Nurse Notes  Additional Screening:  Vision Screening: Recommended annual ophthalmology exams for early detection of glaucoma and other disorders of the eye.  Dental Screening: Recommended annual dental exams for proper oral hygiene  Community Resource Referral / Chronic Care Management: CRR required this visit?  No   CCM required this visit?  No     Plan:     I have personally reviewed and noted the following in the patient's chart:   Medical and social history Use of alcohol, tobacco or illicit drugs  Current medications and supplements including opioid prescriptions. Patient is not currently taking opioid prescriptions. Functional ability and status  Nutritional status Physical activity Advanced directives List of other physicians Hospitalizations, surgeries, and ER visits in previous 12 months Vitals Screenings to include cognitive, depression, and falls Referrals and appointments  In addition, I have reviewed and discussed with patient certain preventive protocols, quality metrics, and best practice recommendations. A written personalized care plan for preventive services as well as general preventive health recommendations were provided to patient.     Marzella Schlein, LPN   7/82/9562   After Visit Summary: (MyChart) Due to this being a telephonic visit, the after visit summary with patients personalized plan was offered to patient via MyChart   Notes: Nothing significant to report at this  time.

## 2023-10-10 NOTE — Patient Instructions (Signed)
 Nicole Dawson , Thank you for taking time to come for your Medicare Wellness Visit. I appreciate your ongoing commitment to your health goals. Please review the following plan we discussed and let me know if I can assist you in the future.   Referrals/Orders/Follow-Ups/Clinician Recommendations: Aim for 30 minutes of exercise or brisk walking, 6-8 glasses of water, and 5 servings of fruits and vegetables each day.   This is a list of the screening recommended for you and due dates:  Health Maintenance  Topic Date Due   Medicare Annual Wellness Visit  10/09/2024   Colon Cancer Screening  11/25/2025   DTaP/Tdap/Td vaccine (4 - Td or Tdap) 03/18/2032   Pneumonia Vaccine  Completed   Flu Shot  Completed   DEXA scan (bone density measurement)  Completed   Hepatitis C Screening  Completed   Zoster (Shingles) Vaccine  Completed   HPV Vaccine  Aged Out   COVID-19 Vaccine  Discontinued    Advanced directives: (Declined) Advance directive discussed with you today. Even though you declined this today, please call our office should you change your mind, and we can give you the proper paperwork for you to fill out.  Next Medicare Annual Wellness Visit scheduled for next year: Yes

## 2023-10-14 ENCOUNTER — Other Ambulatory Visit: Payer: Self-pay | Admitting: Family Medicine

## 2023-10-14 NOTE — Telephone Encounter (Unsigned)
 Copied from CRM [id number]. Topic: Clinical - Medication Refill >> Oct 14, 2023 10:23 AM Alcus Dad wrote: Most Recent Primary Care Visit:  Provider: Marzella Schlein  Department: [facility]  Visit Type: ANNUAL WELL VISIT, SEQUENTIAL  Date: 10/10/2023  Medication: atorvastatin (LIPITOR) 20 MG tablet  Has the patient contacted their pharmacy? Yes (Agent: If no, request that the patient contact the pharmacy for the refill. If patient does not wish to contact the pharmacy document the reason why and proceed with request.) (Agent: If yes, when and what did the pharmacy advise?)  Is this the correct pharmacy for this prescription? Yes If no, delete pharmacy and type the correct one.  This is the patient's preferred pharmacy:  [id number] - [place], [place] - [address] [place] [address] Phone: [phone] Fax: [phone]   Has the prescription been filled recently? No  Is the patient out of the medication? Yes  Has the patient been seen for an appointment in the last year OR does the patient have an upcoming appointment? Yes  Can we respond through MyChart? Yes  Agent: Please be advised that Rx refills may take up to 3 business days. We ask that you follow-up with your pharmacy.

## 2023-10-18 ENCOUNTER — Other Ambulatory Visit: Payer: Self-pay

## 2023-10-18 MED ORDER — ATORVASTATIN CALCIUM 20 MG PO TABS: ORAL_TABLET | ORAL | 3 refills | Status: DC

## 2023-11-03 DIAGNOSIS — J3089 Other allergic rhinitis: Secondary | ICD-10-CM | POA: Diagnosis not present

## 2023-11-15 ENCOUNTER — Encounter: Payer: Self-pay | Admitting: Physician Assistant

## 2023-11-15 ENCOUNTER — Ambulatory Visit (INDEPENDENT_AMBULATORY_CARE_PROVIDER_SITE_OTHER): Admitting: Physician Assistant

## 2023-11-15 VITALS — BP 110/60 | HR 79 | Temp 97.5°F | Ht 62.0 in | Wt 160.4 lb

## 2023-11-15 DIAGNOSIS — G8929 Other chronic pain: Secondary | ICD-10-CM

## 2023-11-15 DIAGNOSIS — M79645 Pain in left finger(s): Secondary | ICD-10-CM

## 2023-11-15 DIAGNOSIS — H8110 Benign paroxysmal vertigo, unspecified ear: Secondary | ICD-10-CM

## 2023-11-15 NOTE — Patient Instructions (Signed)
 It was great to see you!  A referral has been placed for you to see one of our fantastic providers at [facility]. Someone from their office will be in touch soon regarding scheduling your appointment.  Their location:  [facility]  [address] on the 1st floor Phone number [phone] Fax [phone].   This location is across the street from the entrance to [facility] and in the same complex as the [facility]  We will also refer you to vestibular rehab for physical therapy for your vertigo symptom(s)   Take care,  Breeze Angell PA-C

## 2023-11-15 NOTE — Progress Notes (Signed)
 Nicole Dawson is a 73 y.o. female here for a new problem.  History of Present Illness:   Chief Complaint  Patient presents with   Finger Injury    Pt jammed left thumb, not sure what she did, has been going on for a year and getting worse.    HPI  Finger injury: Pt complains of left thumb IP joint pain with associated swelling, ongoing for about 1 year and recently worsening.  She endorses pain with movement and a locking sensation.  Typically she manages her pain with Tylenol or Advil, but notes she does not frequently need to take medications.  She cannot recall any injuries or trauma to her thumb at the time this started.  She reports her left hand is her non-dominant hand.  Denies any numbness or tingling.  She has been retired for about 1 year.   Benign Positional Vertigo: Pt reports a hx of BPPV.  She has been taking precautions with quick positional movements/changes.  She was prescribed Meclizine, which did not help.  She has had 2 episodes of dizziness after using her neti pot.  She has not tried vestibular PT.   Past Medical History:  Diagnosis Date   Allergy    Breast cancer (HCC)    Depression    Diverticulitis    Diverticulosis    Genital herpes    Heart murmur    HLD (hyperlipidemia)    Hx of tobacco use, presenting hazards to health 07/08/2015   22 years at 1 PPD   IBS (irritable bowel syndrome) 07/08/2015   Diagnosis by Dr. Vira Grieves her prior PCP   PONV (postoperative nausea and vomiting)    Sigmoid diverticulitis 07/08/2015     Social History   Tobacco Use   Smoking status: Former    Current packs/day: 0.00    Average packs/day: 1 pack/day for 30.0 years (30.0 ttl pk-yrs)    Types: Cigarettes    Start date: 07/07/1962    Quit date: 07/07/1992    Years since quitting: 31.3   Smokeless tobacco: Never  Substance Use Topics   Alcohol use: No   Drug use: No    Past Surgical History:  Procedure Laterality Date   APPENDECTOMY  1962   BILATERAL  TOTAL MASTECTOMY WITH AXILLARY LYMPH NODE DISSECTION  2005   bilateral mastectomy   BREAST RECONSTRUCTION  2005   TUBAL LIGATION  1984    Family History  Problem Relation Age of Onset   Hyperlipidemia Mother    Hypertension Mother    COPD Mother    Stroke Mother        in 45s.    Heart attack Father        14.    Tuberculosis Father        1/2 of lung removed.    Hyperlipidemia Father    Lymphoma Maternal Aunt    Skin cancer Paternal Grandmother     Allergies  Allergen Reactions   Tape Rash   Dust Mite Extract Cough   Molds & Smuts Cough    Current Medications:   Current Outpatient Medications:    aspirin EC 81 MG tablet, Take 81 mg by mouth at bedtime. , Disp: , Rfl:    atorvastatin (LIPITOR) 20 MG tablet, TAKE 1 TABLET BY MOUTH THREE TIMES WEEKLY (Monday, Wednesday, Friday), Disp: 40 tablet, Rfl: 3   cetirizine (ZYRTEC) 10 MG tablet, Take 10 mg by mouth daily., Disp: , Rfl:    gabapentin (NEURONTIN) 100 MG capsule, Take  1 capsule (100 mg total) by mouth 3 (three) times daily., Disp: 340 capsule, Rfl: 3   mirabegron ER (MYRBETRIQ) 25 MG TB24 tablet, Take 1 tablet (25 mg total) by mouth daily., Disp: 90 tablet, Rfl: 3   montelukast (SINGULAIR) 10 MG tablet, Take 10 mg by mouth at bedtime., Disp: , Rfl:    nitroGLYCERIN (NITRO-DUR) 0.1 mg/hr patch, 1/4 patch daily, Disp: 30 patch, Rfl: 12   valACYclovir (VALTREX) 500 MG tablet, Take 500 mg by mouth at bedtime. , Disp: , Rfl:    Bioflavonoid Products (ESTER C PO), Take 1 tablet by mouth 2 (two) times daily. (Patient not taking: Reported on 11/15/2023), Disp: , Rfl:    Biotin 32440 MCG TABS, Take 1 tablet by mouth daily. (Patient not taking: Reported on 11/15/2023), Disp: , Rfl:    cholecalciferol (VITAMIN D) 1000 units tablet, Take 1,000 Units by mouth 2 (two) times daily at 8 am and 10 pm. (Patient not taking: Reported on 11/15/2023), Disp: , Rfl:    Coenzyme Q10 (COQ-10 PO), Take 1 capsule by mouth daily in the afternoon.  (Patient not taking: Reported on 11/15/2023), Disp: , Rfl:    Multiple Vitamin (MULTIVITAMIN) tablet, Take 1 tablet by mouth daily. (Patient not taking: Reported on 10/10/2023), Disp: , Rfl:    OVER THE COUNTER MEDICATION, Caltrate (Patient not taking: Reported on 11/15/2023), Disp: , Rfl:    Review of Systems:   Negative unless otherwise specified per HPI.  Vitals:   Vitals:   11/15/23 1319  BP: 110/60  Pulse: 79  Temp: (!) 97.5 F (36.4 C)  TempSrc: Temporal  SpO2: 96%  Weight: 160 lb 6.1 oz (72.7 kg)  Height: 5\' 2"  (1.575 m)     Body mass index is 29.33 kg/m.  Physical Exam:   Physical Exam Vitals and nursing note reviewed.  Constitutional:      General: She is not in acute distress.    Appearance: She is well-developed. She is not ill-appearing or toxic-appearing.  Cardiovascular:     Rate and Rhythm: Normal rate and regular rhythm.     Pulses: Normal pulses.     Heart sounds: Normal heart sounds, S1 normal and S2 normal.  Pulmonary:     Effort: Pulmonary effort is normal.     Breath sounds: Normal breath sounds.  Musculoskeletal:     Comments: Left thumb interphalangeal joint with locking movement No visible swelling/abnormality  Skin:    General: Skin is warm and dry.  Neurological:     Mental Status: She is alert.     GCS: GCS eye subscore is 4. GCS verbal subscore is 5. GCS motor subscore is 6.  Psychiatric:        Speech: Speech normal.        Behavior: Behavior normal. Behavior is cooperative.     Assessment and Plan:   1. Chronic pain of left thumb (Primary) Unclear etiology Referral to sports medicine Continue current interventions in place  No need for acute intervention - Ambulatory referral to Sports Medicine  2. Benign paroxysmal positional vertigo, unspecified laterality No new issues/red flags Recommend vestibular rehab physical therapy -- she is agreeable; referral placed - Ambulatory referral to Physical Therapy    I, Bernita Bristle,  acting as a scribe for Alexander Iba, [place]., have documented all relevant documentation on the behalf of Alexander Iba, [place], as directed by  Alexander Iba, PA while in the presence of Alexander Iba, [place].  I, Alexander Iba, [place], have reviewed all documentation for  this visit. The documentation on 11/15/23 for the exam, diagnosis, procedures, and orders are all accurate and complete.  Alexander Iba, PA-C

## 2023-11-16 ENCOUNTER — Other Ambulatory Visit: Payer: Self-pay | Admitting: Family Medicine

## 2023-11-16 NOTE — Therapy (Addendum)
 OUTPATIENT PHYSICAL THERAPY VESTIBULAR EVALUATION     Patient Name: Nicole Dawson MRN: [id number] DOB:1951/07/22, 73 y.o., female Today's Date: 11/21/2023  END OF SESSION:  PT End of Session - 11/21/23 1128     Visit Number 1    Number of Visits 9    Date for PT Re-Evaluation 12/19/23    Authorization Type UHC Medicare/Tricare    Authorization Time Period auth submitted    PT Start Time 1055    PT Stop Time 1135    PT Time Calculation (min) 40 min    Activity Tolerance Patient tolerated treatment well    Behavior During Therapy WFL for tasks assessed/performed             Past Medical History:  Diagnosis Date   Allergy    Breast cancer (HCC)    Depression    Diverticulitis    Diverticulosis    Genital herpes    Heart murmur    HLD (hyperlipidemia)    Hx of tobacco use, presenting hazards to health 07/08/2015   22 years at 1 PPD   IBS (irritable bowel syndrome) 07/08/2015   Diagnosis by Dr. Vira Grieves her prior PCP   PONV (postoperative nausea and vomiting)    Sigmoid diverticulitis 07/08/2015   Past Surgical History:  Procedure Laterality Date   APPENDECTOMY  1962   BILATERAL TOTAL MASTECTOMY WITH AXILLARY LYMPH NODE DISSECTION  2005   bilateral mastectomy   BREAST RECONSTRUCTION  2005   TUBAL LIGATION  1984   Patient Active Problem List   Diagnosis Date Noted   Right rotator cuff tear 10/18/2019   Arthritis of right acromioclavicular joint 09/05/2019   Hyperglycemia 04/13/2019   Subacromial bursitis of right shoulder joint 04/10/2019   Osteopenia 01/19/2018   Obesity (BMI 30.0-34.9) 12/08/2017   Greater trochanteric bursitis of left hip 05/10/2016   IBS (irritable bowel syndrome) 07/08/2015   Sigmoid diverticulitis 07/08/2015   Hx of tobacco use, presenting hazards to health 07/08/2015   Restless leg syndrome 07/05/2014   Wheezing 05/30/2013   Low back pain 08/03/2012   Hyperlipidemia 05/28/2009   TRICUSPID REGURGITATION 05/28/2009   DEPRESSION  03/11/2009   Allergic rhinitis 03/11/2009   BREAST CANCER, HX OF 03/11/2009   HEART MURMUR, HX OF 03/11/2009   History of herpes genitalis 03/11/2009    PCP: Almira Jaeger, MD  REFERRING PROVIDER: Alexander Iba, PA  REFERRING DIAG: H81.10 (ICD-10-CM) - Benign paroxysmal positional vertigo, unspecified laterality  THERAPY DIAG:  BPPV (benign paroxysmal positional vertigo), right - Plan: PT plan of care cert/re-cert  Dizziness and giddiness - Plan: PT plan of care cert/re-cert  Unsteadiness on feet - Plan: PT plan of care cert/re-cert  ONSET DATE: March 2025  Rationale for Evaluation and Treatment: Rehabilitation  SUBJECTIVE:   SUBJECTIVE STATEMENT: Patient reports sporadic dizziness. Had a previous episode of dizziness in the past and found her prescription of Meclizine  for this current episode. Did not have PT last time. Reports that she thinks using the Neti pot brought on this current episode of dizziness. First noticed it when waking up in the AM. Denies head trauma, infection/illness, vision changes/double vision, hearing loss, migraines. Reports baseline tinnitus.  Pt accompanied by: self  PERTINENT HISTORY: Breast CA s/p B mastectomy, depression, HLD   PAIN:  Are you having pain? Reports L thumb pain today- reports that she has an MD appointment for this later this week  PRECAUTIONS: Fall, B mastectomy   RED FLAGS: None   WEIGHT BEARING RESTRICTIONS: No  FALLS: Has patient fallen in last 6 months? No  LIVING ENVIRONMENT: Lives with: lives with their spouse Lives in: House/apartment Stairs: 2 step to enter; 1 floor with bonus room upstair Has following equipment at home: Walker - 2 wheeled and bed side commode  PLOF: Independent  PATIENT GOALS: improve dizziness   OBJECTIVE:  Note: Objective measures were completed at Evaluation unless otherwise noted.  DIAGNOSTIC FINDINGS: none recent  COGNITION: Overall cognitive status: Within functional  limits for tasks assessed   SENSATION: Pt denies N/ T in UEs/LEs  POSTURE:  rounded shoulders and forward head  GAIT: Gait pattern: slightly veering B sides and unsteadiness  Assistive device utilized: None Level of assistance: Modified independence and SBA   PATIENT SURVEYS:  DHI 12/100  VESTIBULAR ASSESSMENT:  GENERAL OBSERVATION: pt wearing progressive lenses    OCULOMOTOR EXAM:  Ocular Alignment: normal  Ocular ROM: No Limitations  Spontaneous Nystagmus: absent  Gaze-Induced Nystagmus: absent  Smooth Pursuits: intact  Saccades: intact  VESTIBULAR - OCULAR REFLEX:   Slow VOR: Normal  VOR Cancellation: Normal  Head-Impulse Test: HIT Right: positive HIT Left: negative     POSITIONAL TESTING:  Right Roll Test: negative Left Roll Test: negative  Loaded Left Dix-Hallpike: negative Loaded Right Dix-Hallpike: R upbeating torsional nystagmus lasting ~25 sec                                                                                                                             TREATMENT DATE: 11/21/23   Canalith Repositioning:  Epley Right: Number of Reps: 1, Response to Treatment: symptoms improved, and Comment: upon retest- much smaller amplitude nystagmus and less intense dizziness    PATIENT EDUCATION: Education details: prognosis, POC, edu on post-CRM expectations  Person educated: Patient Education method: Explanation Education comprehension: verbalized understanding  HOME EXERCISE PROGRAM:  GOALS: Goals reviewed with patient? Yes  SHORT TERM GOALS: Target date: 12/05/2023  Patient to be independent with initial HEP. Baseline: HEP initiated Goal status: INITIAL    LONG TERM GOALS: Target date: 12/19/2023  Patient to be independent with advanced HEP. Baseline: Not yet initiated  Goal status: INITIAL  Patient will report 0/10 dizziness with bed mobility.  Baseline: Symptomatic  Goal status: INITIAL  Patient to score at least 23/24 on FGA  in order to decrease risk of falls. Baseline: NT Goal status: INITIAL  Patient to score at least 18 points less on DHI in order to meet MCID and improve functional outcomes.  Baseline: 12 Goal status: INITIAL   ASSESSMENT:  CLINICAL IMPRESSION:   Patient is a 73 y/o F presenting to OPPT with c/o dizziness for the past 1.5-2 months.Denies head trauma, infection/illness, vision changes/double vision, hearing loss, migraines. Reports baseline tinnitus. Patient presented today with imbalance with gait, positive R HIT, and positive R DH. Patient treated with R Epley x1 with improvement in symptoms, but not resolution. Patient was encouraged to sit in waiting room until she felt safe to drive  after treatment. Would benefit from skilled PT services 1-2x/week for 4 weeks to address aforementioned impairments in order to optimize level of function.    OBJECTIVE IMPAIRMENTS: Abnormal gait, decreased activity tolerance, decreased balance, and dizziness.   ACTIVITY LIMITATIONS: carrying, lifting, bending, stairs, bed mobility, bathing, and reach over head  PARTICIPATION LIMITATIONS: meal prep, cleaning, laundry, driving, shopping, community activity, yard work, and church  PERSONAL FACTORS: Age, Past/current experiences, Time since onset of injury/illness/exacerbation, and 3+ comorbidities: Breast CA s/p B mastectomy, depression, HLD   are also affecting patient's functional outcome.   REHAB POTENTIAL: Good  CLINICAL DECISION MAKING: Evolving/moderate complexity  EVALUATION COMPLEXITY: Moderate   PLAN:  PT FREQUENCY: 1-2x/week  PT DURATION: 4 weeks  PLANNED INTERVENTIONS: 97164- PT Re-evaluation, 97750- Physical Performance Testing, 97110-Therapeutic exercises, 97530- Therapeutic activity, 97112- Neuromuscular re-education, 97535- Self Care, 40981- Manual therapy, [phone]- Gait training, [phone]- Canalith repositioning, Patient/Family education, Balance training, Stair training, Taping, Dry  Needling, Vestibular training, DME instructions, Cryotherapy, and Moist heat  PLAN FOR NEXT SESSION: FGA, recheck R DH and treat  Thaddeus Filippo, PT, DPT 11/21/23 11:50 AM  [facility] [address] [place], [place] [address] Phone # [phone] Fax # [phone]

## 2023-11-21 ENCOUNTER — Other Ambulatory Visit: Payer: Self-pay

## 2023-11-21 ENCOUNTER — Encounter: Payer: Self-pay | Admitting: Physical Therapy

## 2023-11-21 ENCOUNTER — Ambulatory Visit: Attending: Physician Assistant | Admitting: Physical Therapy

## 2023-11-21 DIAGNOSIS — R2681 Unsteadiness on feet: Secondary | ICD-10-CM | POA: Diagnosis not present

## 2023-11-21 DIAGNOSIS — R42 Dizziness and giddiness: Secondary | ICD-10-CM | POA: Insufficient documentation

## 2023-11-21 DIAGNOSIS — H8111 Benign paroxysmal vertigo, right ear: Secondary | ICD-10-CM | POA: Insufficient documentation

## 2023-11-21 DIAGNOSIS — H8110 Benign paroxysmal vertigo, unspecified ear: Secondary | ICD-10-CM | POA: Diagnosis not present

## 2023-11-23 ENCOUNTER — Ambulatory Visit (INDEPENDENT_AMBULATORY_CARE_PROVIDER_SITE_OTHER): Admitting: Family Medicine

## 2023-11-23 ENCOUNTER — Encounter: Payer: Self-pay | Admitting: Family Medicine

## 2023-11-23 ENCOUNTER — Other Ambulatory Visit: Payer: Self-pay

## 2023-11-23 VITALS — BP 116/78 | HR 75 | Ht 62.0 in | Wt 163.0 lb

## 2023-11-23 DIAGNOSIS — G8929 Other chronic pain: Secondary | ICD-10-CM | POA: Diagnosis not present

## 2023-11-23 DIAGNOSIS — M79645 Pain in left finger(s): Secondary | ICD-10-CM

## 2023-11-23 NOTE — Progress Notes (Signed)
   I, Miquel Amen, CMA acting as a scribe for Garlan Juniper, MD.  Nicole Dawson is a 73 y.o. female who presents to [facility] today for L thumb pain. Pt was previously seen by Dr. Alease Hunter in 2023 for R foot and R hip pain.  Today, pt c/o L thumb pain ongoing for about 1 year and recently worsening. Can't recall any injury. Pt locates pain to the left thumb. The thumb is swollen and is catching. Denies pain in the hand or wrist. Tylenol  takes the edge off.   Swelling: present Grip strength: decreased Aggravates: ADLs Treatments tried: IBU, Tylenol   Dx testing: 10/25/22 DEXA scan  Pertinent review of systems: No fevers or chills  Relevant historical information: IBS   Exam:  BP 116/78   Pulse 75   Ht 5\' 2"  (1.575 m)   Wt 163 lb (73.9 kg)   SpO2 97%   BMI 29.81 kg/m  General: Well Developed, well nourished, and in no acute distress.   MSK: Left thumb slightly swollen across MCP.  Tender palpation at palmar MCP.  Triggering present with flexion of IP joint.    Lab and Radiology Results  Procedure: Real-time Ultrasound Guided Injection of left trigger thumb.  Injection performed at left A1 pulley tendon sheath left thumb Device: Philips Affiniti 50G/GE Logiq Images permanently stored and available for review in PACS Verbal informed consent obtained.  Discussed risks and benefits of procedure. Warned about infection, bleeding, hyperglycemia damage to structures among others. Patient expresses understanding and agreement Time-out conducted.   Noted no overlying erythema, induration, or other signs of local infection.   Skin prepped in a sterile fashion.   Local anesthesia: Topical Ethyl chloride.   With sterile technique and under real time ultrasound guidance: 40 mg of Kenalog  and 1 mL of lidocaine injected into tendon sheath. Fluid seen entering the tendon sheath at A1 pulley.   Completed without difficulty   Pain immediately resolved  suggesting accurate placement of the medication.   Advised to call if fevers/chills, erythema, induration, drainage, or persistent bleeding.   Images permanently stored and available for review in the ultrasound unit.  Impression: Technically successful ultrasound guided injection.       Assessment and Plan: 73 y.o. female with left trigger thumb.  Plan for injection today followed by double Band-Aid splint.  Check back as needed.   PDMP not reviewed this encounter. Orders Placed This Encounter  Procedures   US  LIMITED JOINT SPACE STRUCTURES UP LEFT(NO LINKED CHARGES)    Reason for Exam (SYMPTOM  OR DIAGNOSIS REQUIRED):   left thumb pain    Preferred imaging location?:   [facility]   No orders of the defined types were placed in this encounter.    Discussed warning signs or symptoms. Please see discharge instructions. Patient expresses understanding.   The above documentation has been reviewed and is accurate and complete Garlan Juniper, M.D.

## 2023-11-23 NOTE — Patient Instructions (Addendum)
 Thank you for coming in today.   You received an injection today. Seek immediate medical attention if the joint becomes red, extremely painful, or is oozing fluid.   Use the double Band-aid splint  Check back as needed  Your condition was trigger thumb.

## 2023-11-28 ENCOUNTER — Ambulatory Visit

## 2023-11-28 DIAGNOSIS — R42 Dizziness and giddiness: Secondary | ICD-10-CM

## 2023-11-28 DIAGNOSIS — R2681 Unsteadiness on feet: Secondary | ICD-10-CM

## 2023-11-28 DIAGNOSIS — H8110 Benign paroxysmal vertigo, unspecified ear: Secondary | ICD-10-CM | POA: Diagnosis not present

## 2023-11-28 DIAGNOSIS — H8111 Benign paroxysmal vertigo, right ear: Secondary | ICD-10-CM | POA: Diagnosis not present

## 2023-11-28 NOTE — Therapy (Signed)
 OUTPATIENT PHYSICAL THERAPY VESTIBULAR TREATMENT     Patient Name: Nicole Dawson MRN: [id number] DOB:09-16-1950, 73 y.o., female Today's Date: 11/28/2023  END OF SESSION:  PT End of Session - 11/28/23 1147     Visit Number 2    Number of Visits 9    Date for PT Re-Evaluation 12/19/23    Authorization Type UHC Medicare/Tricare    Authorization Time Period auth submitted    PT Start Time 1145    PT Stop Time 1215    PT Time Calculation (min) 30 min    Activity Tolerance Patient tolerated treatment well    Behavior During Therapy WFL for tasks assessed/performed             Past Medical History:  Diagnosis Date   Allergy    Breast cancer (HCC)    Depression    Diverticulitis    Diverticulosis    Genital herpes    Heart murmur    HLD (hyperlipidemia)    Hx of tobacco use, presenting hazards to health 07/08/2015   22 years at 1 PPD   IBS (irritable bowel syndrome) 07/08/2015   Diagnosis by Dr. Vira Grieves her prior PCP   PONV (postoperative nausea and vomiting)    Sigmoid diverticulitis 07/08/2015   Past Surgical History:  Procedure Laterality Date   APPENDECTOMY  1962   BILATERAL TOTAL MASTECTOMY WITH AXILLARY LYMPH NODE DISSECTION  2005   bilateral mastectomy   BREAST RECONSTRUCTION  2005   TUBAL LIGATION  1984   Patient Active Problem List   Diagnosis Date Noted   Right rotator cuff tear 10/18/2019   Arthritis of right acromioclavicular joint 09/05/2019   Hyperglycemia 04/13/2019   Subacromial bursitis of right shoulder joint 04/10/2019   Osteopenia 01/19/2018   Obesity (BMI 30.0-34.9) 12/08/2017   Greater trochanteric bursitis of left hip 05/10/2016   IBS (irritable bowel syndrome) 07/08/2015   Sigmoid diverticulitis 07/08/2015   Hx of tobacco use, presenting hazards to health 07/08/2015   Restless leg syndrome 07/05/2014   Wheezing 05/30/2013   Low back pain 08/03/2012   Hyperlipidemia 05/28/2009   TRICUSPID REGURGITATION 05/28/2009   DEPRESSION  03/11/2009   Allergic rhinitis 03/11/2009   BREAST CANCER, HX OF 03/11/2009   HEART MURMUR, HX OF 03/11/2009   History of herpes genitalis 03/11/2009    PCP: Almira Jaeger, MD  REFERRING PROVIDER: Alexander Iba, PA  REFERRING DIAG: H81.10 (ICD-10-CM) - Benign paroxysmal positional vertigo, unspecified laterality  THERAPY DIAG:  BPPV (benign paroxysmal positional vertigo), right  Dizziness and giddiness  Unsteadiness on feet  ONSET DATE: March 2025  Rationale for Evaluation and Treatment: Rehabilitation  SUBJECTIVE:   SUBJECTIVE STATEMENT: The symptoms seem to be much improved.  Haven't noticed any significant episodes of vertigo since the last session.   Pt accompanied by: self  PERTINENT HISTORY: Breast CA s/p B mastectomy, depression, HLD   PAIN:  Are you having pain? Reports L thumb pain today- reports that she has an MD appointment for this later this week  PRECAUTIONS: Fall, B mastectomy   RED FLAGS: None   WEIGHT BEARING RESTRICTIONS: No  FALLS: Has patient fallen in last 6 months? No  LIVING ENVIRONMENT: Lives with: lives with their spouse Lives in: House/apartment Stairs: 2 step to enter; 1 floor with bonus room upstair Has following equipment at home: Walker - 2 wheeled and bed side commode  PLOF: Independent  PATIENT GOALS: improve dizziness   OBJECTIVE:   TODAY'S TREATMENT: 11/28/23 Activity Comments  Functional Gait Assessment  28/30  Right Dix-Hallpike No nystagmus/dizziness  Brandt-Daroff For self-repositioning PRN              [facility] PT Assessment - 11/28/23 0001       Functional Gait  Assessment   Gait assessed  Yes    Gait Level Surface Walks 20 ft in less than 5.5 sec, no assistive devices, good speed, no evidence for imbalance, normal gait pattern, deviates no more than 6 in outside of the 12 in walkway width.    Change in Gait Speed Able to change speed, demonstrates mild gait deviations, deviates 6-10 in outside of the 12  in walkway width, or no gait deviations, unable to achieve a major change in velocity, or uses a change in velocity, or uses an assistive device.    Gait with Horizontal Head Turns Performs head turns smoothly with no change in gait. Deviates no more than 6 in outside 12 in walkway width    Gait with Vertical Head Turns Performs head turns with no change in gait. Deviates no more than 6 in outside 12 in walkway width.    Gait and Pivot Turn Pivot turns safely within 3 sec and stops quickly with no loss of balance.    Step Over Obstacle Is able to step over 2 stacked shoe boxes taped together (9 in total height) without changing gait speed. No evidence of imbalance.    Gait with Narrow Base of Support Ambulates 7-9 steps.    Gait with Eyes Closed Walks 20 ft, no assistive devices, good speed, no evidence of imbalance, normal gait pattern, deviates no more than 6 in outside 12 in walkway width. Ambulates 20 ft in less than 7 sec.    Ambulating Backwards Walks 20 ft, no assistive devices, good speed, no evidence for imbalance, normal gait    Steps Alternating feet, no rail.    Total Score 28             Note: Objective measures were completed at Evaluation unless otherwise noted.  DIAGNOSTIC FINDINGS: none recent  COGNITION: Overall cognitive status: Within functional limits for tasks assessed   SENSATION: Pt denies N/ T in UEs/LEs  POSTURE:  rounded shoulders and forward head  GAIT: Gait pattern: slightly veering B sides and unsteadiness  Assistive device utilized: None Level of assistance: Modified independence and SBA   PATIENT SURVEYS:  DHI 12/100  VESTIBULAR ASSESSMENT:  GENERAL OBSERVATION: pt wearing progressive lenses    OCULOMOTOR EXAM:  Ocular Alignment: normal  Ocular ROM: No Limitations  Spontaneous Nystagmus: absent  Gaze-Induced Nystagmus: absent  Smooth Pursuits: intact  Saccades: intact  VESTIBULAR - OCULAR REFLEX:   Slow VOR: Normal  VOR Cancellation:  Normal  Head-Impulse Test: HIT Right: positive HIT Left: negative     POSITIONAL TESTING:  Right Roll Test: negative Left Roll Test: negative  Loaded Left Dix-Hallpike: negative Loaded Right Dix-Hallpike: R upbeating torsional nystagmus lasting ~25 sec  TREATMENT DATE: 11/21/23   Canalith Repositioning:  Epley Right: Number of Reps: 1, Response to Treatment: symptoms improved, and Comment: upon retest- much smaller amplitude nystagmus and less intense dizziness    PATIENT EDUCATION: Education details: prognosis, POC, edu on post-CRM expectations  Person educated: Patient Education method: Explanation Education comprehension: verbalized understanding  HOME EXERCISE PROGRAM:  GOALS: Goals reviewed with patient? Yes  SHORT TERM GOALS: Target date: 12/05/2023  Patient to be independent with initial HEP. Baseline: HEP initiated Goal status: INITIAL    LONG TERM GOALS: Target date: 12/19/2023  Patient to be independent with advanced HEP. Baseline: Not yet initiated  Goal status: INITIAL  Patient will report 0/10 dizziness with bed mobility.  Baseline: Symptomatic; 0/10 Goal status: MET  Patient to score at least 23/24 on FGA in order to decrease risk of falls. Baseline: 28/30 Goal status: MET  Patient to score at least 18 points less on DHI in order to meet MCID and improve functional outcomes.  Baseline: 12 Goal status: INITIAL   ASSESSMENT:  CLINICAL IMPRESSION: Reports resolution of symptoms since last session with canalith repositioning.  Functional Gait Assessment reveals score 28/30 indicating low risk for falls.  Reports 0/10 symptoms with position changes, bed mobility, ADL.  Instructed in use of Brandt-Daroff for self-canalith repositioning in case symptoms return and provides good return demonstration.  No further deficits or  limitations evident at this time but discussed with patient on keeping chart open x 30 days in case symptoms return requiring further intervention.  Verbalizes understanding.     OBJECTIVE IMPAIRMENTS: Abnormal gait, decreased activity tolerance, decreased balance, and dizziness.   ACTIVITY LIMITATIONS: carrying, lifting, bending, stairs, bed mobility, bathing, and reach over head  PARTICIPATION LIMITATIONS: meal prep, cleaning, laundry, driving, shopping, community activity, yard work, and church  PERSONAL FACTORS: Age, Past/current experiences, Time since onset of injury/illness/exacerbation, and 3+ comorbidities: Breast CA s/p B mastectomy, depression, HLD   are also affecting patient's functional outcome.   REHAB POTENTIAL: Good  CLINICAL DECISION MAKING: Evolving/moderate complexity  EVALUATION COMPLEXITY: Moderate   PLAN:  PT FREQUENCY: 1-2x/week  PT DURATION: 4 weeks  PLANNED INTERVENTIONS: 97164- PT Re-evaluation, 97750- Physical Performance Testing, 97110-Therapeutic exercises, 97530- Therapeutic activity, [id number]- Neuromuscular re-education, 97535- Self Care, 16109- Manual therapy, [id number]- Gait training, [id number]- Canalith repositioning, Patient/Family education, Balance training, Stair training, Taping, Dry Needling, Vestibular training, DME instructions, Cryotherapy, and Moist heat  PLAN FOR NEXT SESSION: chart open x 30 days in case return of symptoms.   12:17 PM, 11/28/23 M. Kelly Deshondra Worst, PT, DPT Physical Therapist- [place] Office Number: [phone]

## 2023-12-05 ENCOUNTER — Ambulatory Visit: Attending: Physician Assistant | Admitting: Physical Therapy

## 2024-02-08 DIAGNOSIS — J069 Acute upper respiratory infection, unspecified: Secondary | ICD-10-CM | POA: Diagnosis not present

## 2024-02-08 DIAGNOSIS — J3089 Other allergic rhinitis: Secondary | ICD-10-CM | POA: Diagnosis not present

## 2024-02-16 DIAGNOSIS — L814 Other melanin hyperpigmentation: Secondary | ICD-10-CM | POA: Diagnosis not present

## 2024-02-16 DIAGNOSIS — Z08 Encounter for follow-up examination after completed treatment for malignant neoplasm: Secondary | ICD-10-CM | POA: Diagnosis not present

## 2024-02-16 DIAGNOSIS — L821 Other seborrheic keratosis: Secondary | ICD-10-CM | POA: Diagnosis not present

## 2024-02-16 DIAGNOSIS — D2371 Other benign neoplasm of skin of right lower limb, including hip: Secondary | ICD-10-CM | POA: Diagnosis not present

## 2024-02-16 DIAGNOSIS — Z85828 Personal history of other malignant neoplasm of skin: Secondary | ICD-10-CM | POA: Diagnosis not present

## 2024-03-01 ENCOUNTER — Encounter: Payer: Medicare Other | Admitting: Family Medicine

## 2024-03-01 DIAGNOSIS — H2513 Age-related nuclear cataract, bilateral: Secondary | ICD-10-CM | POA: Diagnosis not present

## 2024-03-01 DIAGNOSIS — H5213 Myopia, bilateral: Secondary | ICD-10-CM | POA: Diagnosis not present

## 2024-04-06 ENCOUNTER — Encounter: Payer: Self-pay | Admitting: Family Medicine

## 2024-04-06 ENCOUNTER — Ambulatory Visit: Admitting: Family Medicine

## 2024-04-06 ENCOUNTER — Ambulatory Visit: Payer: Self-pay | Admitting: Family Medicine

## 2024-04-06 VITALS — BP 102/62 | HR 64 | Temp 97.7°F | Ht 62.0 in | Wt 156.2 lb

## 2024-04-06 DIAGNOSIS — R739 Hyperglycemia, unspecified: Secondary | ICD-10-CM

## 2024-04-06 DIAGNOSIS — E785 Hyperlipidemia, unspecified: Secondary | ICD-10-CM | POA: Diagnosis not present

## 2024-04-06 DIAGNOSIS — Z131 Encounter for screening for diabetes mellitus: Secondary | ICD-10-CM | POA: Diagnosis not present

## 2024-04-06 DIAGNOSIS — Z0000 Encounter for general adult medical examination without abnormal findings: Secondary | ICD-10-CM | POA: Diagnosis not present

## 2024-04-06 LAB — COMPREHENSIVE METABOLIC PANEL WITH GFR
ALT: 33 U/L (ref 0–35)
AST: 28 U/L (ref 0–37)
Albumin: 4.1 g/dL (ref 3.5–5.2)
Alkaline Phosphatase: 120 U/L — ABNORMAL HIGH (ref 39–117)
BUN: 18 mg/dL (ref 6–23)
CO2: 29 meq/L (ref 19–32)
Calcium: 9.3 mg/dL (ref 8.4–10.5)
Chloride: 103 meq/L (ref 96–112)
Creatinine, Ser: 0.73 mg/dL (ref 0.40–1.20)
GFR: 82.84 mL/min (ref >60.00)
Glucose, Bld: 85 mg/dL (ref 70–99)
Potassium: 4.2 meq/L (ref 3.5–5.1)
Sodium: 141 meq/L (ref 135–145)
Total Bilirubin: 1.1 mg/dL (ref 0.2–1.2)
Total Protein: 7.5 g/dL (ref 6.0–8.3)

## 2024-04-06 LAB — URINALYSIS, ROUTINE W REFLEX MICROSCOPIC
Bilirubin Urine: NEGATIVE
Hgb urine dipstick: NEGATIVE
Ketones, ur: NEGATIVE
Nitrite: NEGATIVE
Specific Gravity, Urine: >=1.03 — AB (ref 1.000–1.030)
Total Protein, Urine: NEGATIVE
Urine Glucose: NEGATIVE
Urobilinogen, UA: 0.2 (ref 0.0–1.0)
pH: 6 (ref 5.0–8.0)

## 2024-04-06 LAB — CBC WITH DIFFERENTIAL/PLATELET
Basophils Absolute: 0 K/uL (ref 0.0–0.1)
Basophils Relative: 0.6 % (ref 0.0–3.0)
Eosinophils Absolute: 0.1 K/uL (ref 0.0–0.7)
Eosinophils Relative: 1.9 % (ref 0.0–5.0)
HCT: 45.1 % (ref 36.0–46.0)
Hemoglobin: 15.2 g/dL — ABNORMAL HIGH (ref 12.0–15.0)
Lymphocytes Relative: 24.9 % (ref 12.0–46.0)
Lymphs Abs: 1.6 K/uL (ref 0.7–4.0)
MCHC: 33.6 g/dL (ref 30.0–36.0)
MCV: 95 fl (ref 78.0–100.0)
Monocytes Absolute: 0.6 K/uL (ref 0.1–1.0)
Monocytes Relative: 8.5 % (ref 3.0–12.0)
Neutro Abs: 4.2 K/uL (ref 1.4–7.7)
Neutrophils Relative %: 64.1 % (ref 43.0–77.0)
Platelets: 243 K/uL (ref 150.0–400.0)
RBC: 4.75 Mil/uL (ref 3.87–5.11)
RDW: 14.3 % (ref 11.5–15.5)
WBC: 6.6 K/uL (ref 4.0–10.5)

## 2024-04-06 LAB — HEMOGLOBIN A1C: Hgb A1c MFr Bld: 5.7 % (ref 4.6–6.5)

## 2024-04-06 LAB — LIPID PANEL
Cholesterol: 174 mg/dL (ref 0–200)
HDL: 57.6 mg/dL (ref >39.00)
LDL Cholesterol: 83 mg/dL (ref 0–99)
NonHDL: 116.89
Total CHOL/HDL Ratio: 3
Triglycerides: 168 mg/dL — ABNORMAL HIGH (ref 0.0–149.0)
VLDL: 33.6 mg/dL (ref 0.0–40.0)

## 2024-04-06 NOTE — Patient Instructions (Addendum)
 Goal 150 minutes a week of exercise per week-particularly weight bearing like walking or weights  Please stop by lab before you go If you have mychart- we will send your results within 3 business days of us  receiving them.  If you do not have mychart- we will call you about results within 5 business days of us  receiving them.  *please also note that you will see labs on mychart as soon as they post. I will later go in and write notes on them- will say notes from Dr. Katrinka   Recommended follow up: Return in about 1 year (around 04/06/2025) for physical or sooner if needed.Schedule b4 you leave.

## 2024-04-06 NOTE — Progress Notes (Signed)
 Phone [phone]   Subjective:  Patient presents today for their annual physical. Chief complaint-noted.   See problem oriented charting- ROS- full  review of systems was completed and negative except for topics noted under acute/chronic concerns   The following were reviewed and entered/updated in epic: Past Medical History:  Diagnosis Date   Allergy    Breast cancer (HCC)    Depression    Diverticulitis    Diverticulosis    Genital herpes    Heart murmur    HLD (hyperlipidemia)    Hx of tobacco use, presenting hazards to health 07/08/2015   22 years at 1 PPD   IBS (irritable bowel syndrome) 07/08/2015   Diagnosis by Dr. Floretta her prior PCP   PONV (postoperative nausea and vomiting)    Sigmoid diverticulitis 07/08/2015   Patient Active Problem List   Diagnosis Date Noted   Osteopenia 01/19/2018    Priority: High   Hyperglycemia 04/13/2019    Priority: Medium    IBS (irritable bowel syndrome) 07/08/2015    Priority: Medium    Restless leg syndrome 07/05/2014    Priority: Medium    Hyperlipidemia 05/28/2009    Priority: Medium    BREAST CANCER, HX OF 03/11/2009    Priority: Medium    Obesity (BMI 30.0-34.9) 12/08/2017    Priority: Low   Sigmoid diverticulitis 07/08/2015    Priority: Low   Hx of tobacco use, presenting hazards to health 07/08/2015    Priority: Low   Wheezing 05/30/2013    Priority: Low   TRICUSPID REGURGITATION 05/28/2009    Priority: Low   DEPRESSION 03/11/2009    Priority: Low   Allergic rhinitis 03/11/2009    Priority: Low   HEART MURMUR, HX OF 03/11/2009    Priority: Low   History of herpes genitalis 03/11/2009    Priority: Low   Right rotator cuff tear 10/18/2019    Priority: 1.   Arthritis of right acromioclavicular joint 09/05/2019    Priority: 1.   Subacromial bursitis of right shoulder joint 04/10/2019    Priority: 1.   Greater trochanteric bursitis of left hip 05/10/2016    Priority: 1.   Low back pain 08/03/2012     Priority: 1.   Past Surgical History:  Procedure Laterality Date   APPENDECTOMY  1962   BILATERAL TOTAL MASTECTOMY WITH AXILLARY LYMPH NODE DISSECTION  2005   bilateral mastectomy   BREAST RECONSTRUCTION  2005   TUBAL LIGATION  1984    Family History  Problem Relation Age of Onset   Hyperlipidemia Mother    Hypertension Mother    COPD Mother    Stroke Mother        in 22s.    Heart attack Father        2.    Tuberculosis Father        1/2 of lung removed.    Hyperlipidemia Father    Lymphoma Maternal Aunt    Skin cancer Paternal Grandmother     Medications- reviewed and updated Current Outpatient Medications  Medication Sig Dispense Refill   aspirin  EC 81 MG tablet Take 81 mg by mouth at bedtime.      atorvastatin  (LIPITOR) 20 MG tablet TAKE 1 TABLET BY MOUTH THREE TIMES WEEKLY (Monday, Wednesday, Friday) 40 tablet 3   Bioflavonoid Products (ESTER C PO) Take 1 tablet by mouth 2 (two) times daily.     cetirizine  (ZYRTEC ) 10 MG tablet Take 10 mg by mouth daily.     cholecalciferol (VITAMIN  D) 1000 units tablet Take 1,000 Units by mouth 2 (two) times daily at 8 am and 10 pm. (Patient taking differently: Take 2,000 Units by mouth daily. Taking 1 tablet twice daily.)     Coenzyme Q10 (COQ-10 PO) Take 1 capsule by mouth daily in the afternoon.     gabapentin  (NEURONTIN ) 100 MG capsule Take 1 capsule (100 mg total) by mouth 3 (three) times daily. 340 capsule 3   mirabegron  ER (MYRBETRIQ ) 25 MG TB24 tablet TAKE 1 TABLET(25 MG) BY MOUTH DAILY 90 tablet 1   montelukast  (SINGULAIR ) 10 MG tablet Take 10 mg by mouth at bedtime.     Multiple Vitamin (MULTIVITAMIN) tablet Take 1 tablet by mouth daily.     nitroGLYCERIN  (NITRO-DUR ) 0.1 mg/hr patch 1/4 patch daily 30 patch 12   valACYclovir  (VALTREX ) 500 MG tablet Take 500 mg by mouth at bedtime.      Biotin 89999 MCG TABS Take 1 tablet by mouth daily. (Patient not taking: Reported on 04/06/2024)     OVER THE COUNTER MEDICATION Caltrate      No current facility-administered medications for this visit.    Allergies-reviewed and updated Allergies  Allergen Reactions   Tape Rash   Dust Mite Extract Cough   Molds & Smuts Cough    Social History   Social History Narrative   Lives in [place], previously lived in [place] until 2020. Married 2007. 2 kids (son and daughter). 2 grandkids (granddaughter and grandson). Both in [place]. Grandson on on the way (son's) in April 2016.       Retired in December 2023- Worked at [employer]- a branch of [employer].    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       Hobbies: computer games, tv   Objective  Objective:  BP 102/62 (BP Location: Left Arm, Patient Position: Sitting, Cuff Size: Normal)   Pulse 64   Temp 97.7 F (36.5 C) (Temporal)   Ht 5' 2 (1.575 m)   Wt 156 lb 3.2 oz (70.9 kg)   SpO2 (!) 62%   BMI 28.57 kg/m  Gen: NAD, resting comfortably HEENT: Mucous membranes are moist. Oropharynx normal Neck: no thyromegaly CV: RRR no murmurs rubs or gallops Lungs: CTAB no crackles, wheeze, rhonchi Abdomen: soft/nontender/nondistended/normal bowel sounds. No rebound or guarding.  Ext: no edema Skin: warm, dry Neuro: grossly normal, moves all extremities, PERRLA   Assessment and Plan   73 y.o. female presenting for annual physical.  Health Maintenance counseling: 1. Anticipatory guidance: Patient counseled regarding regular dental exams -q6 months, eye exams -,  avoiding smoking and second hand smoke , limiting alcohol  to 1 beverage per day- not drinking- last 2007 , no illicit drugs .   2. Risk factor reduction:  Advised patient of need for regular exercise and diet rich and fruits and vegetables to reduce risk of heart attack and stroke.  Exercise- [employer] sometimes- enocuraged more consistency.  Diet/weight management-down 19 lbs from last march- very intentional about healthier  gettings.  Wt Readings from Last 3 Encounters:  04/06/24 156 lb 3.2 oz (70.9 kg)  11/23/23 163 lb (73.9 kg)  11/15/23 160 lb 6.1 oz (72.7 kg)  3. Immunizations/screenings/ancillary studies- opts out of vaccine, plans on flu shot later in year Immunization History  Administered Date(s) Administered   Fluad Quad(high Dose 65+) 04/13/2019, 05/15/2021, 06/03/2022   Fluad Trivalent(High Dose 65+) 05/18/2023   Hepatitis B 04/02/2013   Influenza,inj,Quad PF,6+ Mos  05/29/2018   Influenza-Unspecified 04/02/2013, 05/02/2014, 06/11/2014, 06/18/2015, 06/02/2016, 03/28/2017, 05/03/2019   PFIZER(Purple Top)SARS-COV-2 Vaccination 07/24/2019, 08/15/2019   Pneumococcal Conjugate-13 03/24/2018   Pneumococcal Polysaccharide-23 04/13/2019   Td 05/28/2009, 03/18/2022   Tdap 03/10/2022   Zoster Recombinant(Shingrix) 02/23/2021, 06/12/2021   Zoster, Live 11/06/2015, 07/27/2016  4. Cervical cancer screening- past age based screening recommendation s-followed by Dr. Rox with last Pap 2017 on file- she reports she still has paps though through GYN  5. Breast cancer screening-  breast exam with gynecology and plastic surgery and mammogram -not done due to prophylactic mastectomy with history of breast cancer in 2000-follow [facility] plastic surgery   6. Colon cancer screening - 11/26/2015 with hyperplastic polyp continue q10 year follow-up plan . Next due 2027  7. Skin cancer screening-sees dermatology yearly.  advised regular sunscreen use. Denies worrisome, changing, or new skin lesions.   8. Birth control/STD check- postmenopausal/monogamous   9. Osteoporosis screening at 51- see osteopenia section 10. Smoking associated screening -former smoker-quit over 20 years ago.  With 22 pack years. Offered urinalysis - opts in   Status of chronic or acute concerns   # Tremors-patient concerned about possible Parkinson's-we discussed tremors in detail and reports intermittent issues a few times a week when gets  fatigued. No tremor at rest. Notes with brushing teeth for instance or eating food and sometimes handwriting.   -sounds like essential tremor we will monitor  #OAB- myrbetriq  25mg  started Dr. Cam- we continued #Microscopic hematuria-  Patient with 3-6 RBCs per high-power field on 05/02/2019 and referred to urology--largely unremarkable work-up and was found to have a cystocele - make sure stable today  #hyperlipidemia S: Medication:Atorvastatin  20 mg on Monday Wednesday and Friday started August 2024-targeting LDL at least under 100 but also has elevated A1c in the past so we are being cautious Lab Results  Component Value Date   CHOL 186 03/01/2023   HDL 57.60 03/01/2023   LDLCALC 109 (H) 03/01/2023   LDLDIRECT 94.6 07/05/2014   TRIG 97.0 03/01/2023   CHOLHDL 3 03/01/2023  A/P: lipids mildly above goal but cautious with prediabetes risk to increase (5.7 about 10 years ago but good recently)- continue to work on healthy eating and regular exercise - has done great job with weight loss and hoping improved   # Low Bone density (formerly osteopenia) S: Last DEXA: 10/25/2022 with right femoral neck and -2.4 in 10-year total fracture risk of 21% and hip fracture risk 4.9%  Calcium : 1200mg  (through diet ok) recommended  Vitamin D: 1000 units a day recommended- taking  - needs to increase exercise A/P: DEXA 2-3 years out- encouraged to continue vitamin D   #restless legs syndrome - gabapentin  300 mg before bedtime for most part   # Hyperglycemia/insulin resistance/prediabetes-peak A1c 5.7 in 2016 but has been better since  # BPPV-saw Dr. Kennyth March 2025 and did vestibular therapy- doing better   Allergic rhinitis-takes Zyrtec  and Singulair   -in past as well as Nasacort  as needed - but nosebleeds August 2024 -Neti pot may have triggered BBPV so wants to be cautiosu  IBS- tolerable lately with regular benefiber  Recommended follow up: Return in about 1 year (around 04/06/2025) for  physical or sooner if needed.Schedule b4 you leave. Future Appointments  Date Time Provider Department Center  10/11/2024  2:20 PM [facility] ANNUAL WELLNESS VISIT 1 [facility] [place]   Lab/Order associations:not fasting   ICD-10-CM   1. Preventative health care  Z00.00     2. Hyperlipidemia, unspecified  hyperlipidemia type  E78.5 Comprehensive metabolic panel with GFR    CBC with Differential/Platelet    Lipid panel    Urinalysis, Routine w reflex microscopic    3. Hyperglycemia  R73.9 Hemoglobin A1c    4. Screening for diabetes mellitus  Z13.1 Hemoglobin A1c      No orders of the defined types were placed in this encounter.   Return precautions advised.  Garnette Lukes, MD

## 2024-05-30 ENCOUNTER — Other Ambulatory Visit: Payer: Self-pay | Admitting: Family Medicine

## 2024-07-17 ENCOUNTER — Other Ambulatory Visit: Payer: Self-pay | Admitting: Family Medicine

## 2024-08-06 ENCOUNTER — Other Ambulatory Visit: Payer: Self-pay | Admitting: Family Medicine

## 2024-08-13 ENCOUNTER — Ambulatory Visit: Admitting: Family Medicine

## 2024-08-13 ENCOUNTER — Other Ambulatory Visit: Payer: Self-pay

## 2024-08-13 VITALS — BP 116/72 | HR 86 | Ht 62.0 in | Wt 155.0 lb

## 2024-08-13 DIAGNOSIS — M79645 Pain in left finger(s): Secondary | ICD-10-CM | POA: Diagnosis not present

## 2024-08-13 DIAGNOSIS — G8929 Other chronic pain: Secondary | ICD-10-CM | POA: Diagnosis not present

## 2024-08-13 DIAGNOSIS — M67442 Ganglion, left hand: Secondary | ICD-10-CM

## 2024-08-13 NOTE — Patient Instructions (Addendum)
 Thank you for coming in today.   You received an injection today. Seek immediate medical attention if the joint becomes red, extremely painful, or is oozing fluid.   Let me know if not better and I can refer to a hand surgeon  Check back as needed

## 2024-08-13 NOTE — Progress Notes (Signed)
"       ° °  LILLETTE Ileana Collet, PhD, LAT, ATC acting as a scribe for Artist Lloyd, MD.  Nicole Dawson is a 74 y.o. female who presents to [facility] today for a lump on her L hand. Pt was previously seen by Dr. Lloyd on 11/23/23 for L thumb pain.  Today, pt reports 1st noticing a lump on her L hand about a month ago. Pt locates the lump to the palmar aspect, along the 1st MCP joint. No triggering.  Pertinent review of systems: no fever or chills  Relevant historical information: Left thumb trigger finger injected in April 2025.   Exam:  BP 116/72   Pulse 86   Ht 5' 2 (1.575 m)   Wt 155 lb (70.3 kg)   SpO2 92%   BMI 28.35 kg/m  General: Well Developed, well nourished, and in no acute distress.   MSK: Left hand swelling at palmar MCP left thumb.  Tender to palpation minimally.  Normal thumb motion no triggering.    Lab and Radiology Results  Procedure: Real-time Ultrasound Guided Injection of left thumb palmar ganglion cyst Device: Philips Affiniti 50G/GE Logiq Images permanently stored and available for review in PACS Ultrasound evaluation prior to injection reveals a small ganglion cyst associated with the flexor tendon of the left thumb measuring about 2 mm in diameter.  Location is at the palmar MCP. Verbal informed consent obtained.  Discussed risks and benefits of procedure. Warned about infection, bleeding, hyperglycemia damage to structures among others. Patient expresses understanding and agreement Time-out conducted.   Noted no overlying erythema, induration, or other signs of local infection.   Skin prepped in a sterile fashion.   Local anesthesia: Topical Ethyl chloride.   With sterile technique and under real time ultrasound guidance: 40 mg of Kenalog  and 1 mL of lidocaine injected into ganglion cyst palmar first MCP. Fluid seen entering the ganglion cyst.   Completed without difficulty   Pain immediately resolved suggesting accurate  placement of the medication.   Advised to call if fevers/chills, erythema, induration, drainage, or persistent bleeding.   Images permanently stored and available for review in the ultrasound unit.  Impression: Technically successful ultrasound guided injection.        Assessment and Plan: 74 y.o. female with left hand ganglion cyst associate with the flexor tendon of the left thumb.  Plan for injection today.  If not improved consider referral to hand surgery.   PDMP not reviewed this encounter. Orders Placed This Encounter  Procedures   US  LIMITED JOINT SPACE STRUCTURES UP LEFT(NO LINKED CHARGES)    Reason for Exam (SYMPTOM  OR DIAGNOSIS REQUIRED):   left hand nodule    Preferred imaging location?:   [facility]   No orders of the defined types were placed in this encounter.    Discussed warning signs or symptoms. Please see discharge instructions. Patient expresses understanding.   The above documentation has been reviewed and is accurate and complete Artist Lloyd, M.D.   "

## 2024-10-11 ENCOUNTER — Ambulatory Visit
